# Patient Record
Sex: Female | Born: 1935
Health system: Southern US, Community
[De-identification: ages and names within clinical notes are randomized; demographics above are authoritative.]

## PROBLEM LIST (undated history)

## (undated) DIAGNOSIS — I509 Heart failure, unspecified: Secondary | ICD-10-CM

## (undated) DIAGNOSIS — I4891 Unspecified atrial fibrillation: Secondary | ICD-10-CM

## (undated) DIAGNOSIS — I428 Other cardiomyopathies: Secondary | ICD-10-CM

## (undated) DIAGNOSIS — E785 Hyperlipidemia, unspecified: Secondary | ICD-10-CM

## (undated) DIAGNOSIS — Z7901 Long term (current) use of anticoagulants: Secondary | ICD-10-CM

## (undated) DIAGNOSIS — I341 Nonrheumatic mitral (valve) prolapse: Secondary | ICD-10-CM

## (undated) HISTORY — DX: Nonrheumatic mitral (valve) prolapse: I34.1

## (undated) HISTORY — DX: Other cardiomyopathies: I42.8

## (undated) HISTORY — DX: Long term (current) use of anticoagulants: Z79.01

## (undated) HISTORY — PX: OTHER SURGICAL HISTORY: SHX169

## (undated) HISTORY — DX: Heart failure, unspecified: I50.9

## (undated) HISTORY — DX: Unspecified atrial fibrillation: I48.91

## (undated) HISTORY — DX: Hyperlipidemia, unspecified: E78.5

## (undated) HISTORY — PX: HEMORROIDECTOMY: SUR656

## (undated) HISTORY — PX: TONSILLECTOMY: SUR1361

---

## 1998-06-26 ENCOUNTER — Other Ambulatory Visit: Admission: RE | Admit: 1998-06-26 | Discharge: 1998-06-26 | Payer: Self-pay | Admitting: Obstetrics and Gynecology

## 1999-04-03 ENCOUNTER — Other Ambulatory Visit: Admission: RE | Admit: 1999-04-03 | Discharge: 1999-04-03 | Payer: Self-pay | Admitting: Obstetrics and Gynecology

## 1999-04-03 ENCOUNTER — Encounter (INDEPENDENT_AMBULATORY_CARE_PROVIDER_SITE_OTHER): Payer: Self-pay | Admitting: Specialist

## 1999-07-03 ENCOUNTER — Other Ambulatory Visit: Admission: RE | Admit: 1999-07-03 | Discharge: 1999-07-03 | Payer: Self-pay | Admitting: Obstetrics and Gynecology

## 2000-04-14 ENCOUNTER — Encounter: Payer: Self-pay | Admitting: Internal Medicine

## 2000-07-08 ENCOUNTER — Other Ambulatory Visit: Admission: RE | Admit: 2000-07-08 | Discharge: 2000-07-08 | Payer: Self-pay | Admitting: Obstetrics and Gynecology

## 2001-07-10 ENCOUNTER — Other Ambulatory Visit: Admission: RE | Admit: 2001-07-10 | Discharge: 2001-07-10 | Payer: Self-pay | Admitting: Obstetrics and Gynecology

## 2002-08-24 ENCOUNTER — Ambulatory Visit (HOSPITAL_COMMUNITY): Admission: RE | Admit: 2002-08-24 | Discharge: 2002-08-24 | Payer: Self-pay | Admitting: Gastroenterology

## 2002-11-23 ENCOUNTER — Other Ambulatory Visit: Admission: RE | Admit: 2002-11-23 | Discharge: 2002-11-23 | Payer: Self-pay | Admitting: Obstetrics and Gynecology

## 2004-04-03 ENCOUNTER — Ambulatory Visit (HOSPITAL_COMMUNITY): Admission: RE | Admit: 2004-04-03 | Discharge: 2004-04-03 | Payer: Self-pay | Admitting: Cardiology

## 2004-07-20 ENCOUNTER — Inpatient Hospital Stay (HOSPITAL_COMMUNITY): Admission: RE | Admit: 2004-07-20 | Discharge: 2004-07-28 | Payer: Self-pay | Admitting: Cardiothoracic Surgery

## 2004-07-21 ENCOUNTER — Encounter (INDEPENDENT_AMBULATORY_CARE_PROVIDER_SITE_OTHER): Payer: Self-pay | Admitting: *Deleted

## 2004-07-24 ENCOUNTER — Ambulatory Visit: Payer: Self-pay | Admitting: Internal Medicine

## 2004-08-12 ENCOUNTER — Inpatient Hospital Stay (HOSPITAL_COMMUNITY): Admission: AD | Admit: 2004-08-12 | Discharge: 2004-08-22 | Payer: Self-pay | Admitting: Cardiology

## 2004-10-05 ENCOUNTER — Encounter (HOSPITAL_COMMUNITY): Admission: RE | Admit: 2004-10-05 | Discharge: 2005-01-03 | Payer: Self-pay | Admitting: Cardiology

## 2005-01-04 ENCOUNTER — Encounter (HOSPITAL_COMMUNITY): Admission: RE | Admit: 2005-01-04 | Discharge: 2005-01-09 | Payer: Self-pay | Admitting: Cardiology

## 2005-01-11 ENCOUNTER — Encounter (HOSPITAL_COMMUNITY): Admission: RE | Admit: 2005-01-11 | Discharge: 2005-04-11 | Payer: Self-pay | Admitting: Cardiology

## 2005-04-30 ENCOUNTER — Encounter (HOSPITAL_COMMUNITY): Admission: RE | Admit: 2005-04-30 | Discharge: 2005-07-28 | Payer: Self-pay | Admitting: Cardiology

## 2005-07-30 ENCOUNTER — Encounter (HOSPITAL_COMMUNITY): Admission: RE | Admit: 2005-07-30 | Discharge: 2005-10-28 | Payer: Self-pay | Admitting: Cardiology

## 2005-10-29 ENCOUNTER — Encounter (HOSPITAL_COMMUNITY): Admission: RE | Admit: 2005-10-29 | Discharge: 2006-01-27 | Payer: Self-pay | Admitting: Cardiology

## 2006-01-29 ENCOUNTER — Encounter (HOSPITAL_COMMUNITY): Admission: RE | Admit: 2006-01-29 | Discharge: 2006-04-29 | Payer: Self-pay | Admitting: Cardiology

## 2006-04-11 ENCOUNTER — Ambulatory Visit: Payer: Self-pay | Admitting: Family Medicine

## 2006-04-30 ENCOUNTER — Encounter (HOSPITAL_COMMUNITY): Admission: RE | Admit: 2006-04-30 | Discharge: 2006-07-29 | Payer: Self-pay | Admitting: Cardiology

## 2006-05-04 ENCOUNTER — Ambulatory Visit: Payer: Self-pay | Admitting: Family Medicine

## 2006-06-15 ENCOUNTER — Ambulatory Visit: Payer: Self-pay | Admitting: Family Medicine

## 2006-07-06 ENCOUNTER — Ambulatory Visit: Payer: Self-pay | Admitting: Cardiovascular Disease

## 2006-07-13 ENCOUNTER — Ambulatory Visit: Payer: Self-pay

## 2006-07-13 ENCOUNTER — Ambulatory Visit: Payer: Self-pay | Admitting: Cardiovascular Disease

## 2006-07-13 ENCOUNTER — Encounter: Payer: Self-pay | Admitting: Cardiology

## 2006-07-13 LAB — CONVERTED CEMR LAB
ALT: 52 units/L — ABNORMAL HIGH (ref 0–40)
Basophils Relative: 0.7 % (ref 0.0–1.0)
Bilirubin, Direct: 0.3 mg/dL (ref 0.0–0.3)
CO2: 29 meq/L (ref 19–32)
Calcium: 9.1 mg/dL (ref 8.4–10.5)
Eosinophils Relative: 1.4 % (ref 0.0–5.0)
GFR calc Af Amer: 78 mL/min
Glucose, Bld: 100 mg/dL — ABNORMAL HIGH (ref 70–99)
Hemoglobin: 14.9 g/dL (ref 12.0–15.0)
Lymphocytes Relative: 18.7 % (ref 12.0–46.0)
Monocytes Absolute: 0.4 10*3/uL (ref 0.2–0.7)
Neutro Abs: 4.5 10*3/uL (ref 1.4–7.7)
Potassium: 4.6 meq/L (ref 3.5–5.1)
Total Protein: 6.2 g/dL (ref 6.0–8.3)
VLDL: 19 mg/dL (ref 0–40)
WBC: 6.1 10*3/uL (ref 4.5–10.5)

## 2006-07-15 ENCOUNTER — Ambulatory Visit: Payer: Self-pay | Admitting: Family Medicine

## 2006-07-20 ENCOUNTER — Ambulatory Visit: Payer: Self-pay | Admitting: Family Medicine

## 2006-07-28 ENCOUNTER — Ambulatory Visit: Payer: Self-pay | Admitting: Cardiovascular Disease

## 2006-07-28 LAB — CONVERTED CEMR LAB
Calcium: 9.2 mg/dL (ref 8.4–10.5)
GFR calc Af Amer: 78 mL/min
GFR calc non Af Amer: 65 mL/min
Glucose, Bld: 89 mg/dL (ref 70–99)

## 2006-07-30 ENCOUNTER — Encounter (HOSPITAL_COMMUNITY): Admission: RE | Admit: 2006-07-30 | Discharge: 2006-10-28 | Payer: Self-pay | Admitting: Cardiovascular Disease

## 2006-08-05 ENCOUNTER — Ambulatory Visit: Payer: Self-pay | Admitting: Family Medicine

## 2006-08-24 ENCOUNTER — Ambulatory Visit: Payer: Self-pay | Admitting: Cardiovascular Disease

## 2006-09-26 ENCOUNTER — Encounter: Admission: RE | Admit: 2006-09-26 | Discharge: 2006-09-26 | Payer: Self-pay | Admitting: Family Medicine

## 2006-09-26 ENCOUNTER — Ambulatory Visit: Payer: Self-pay | Admitting: Family Medicine

## 2006-10-25 ENCOUNTER — Ambulatory Visit: Payer: Self-pay | Admitting: Cardiovascular Disease

## 2006-10-31 ENCOUNTER — Encounter (HOSPITAL_COMMUNITY): Admission: RE | Admit: 2006-10-31 | Discharge: 2007-01-29 | Payer: Self-pay | Admitting: Cardiovascular Disease

## 2006-12-14 ENCOUNTER — Ambulatory Visit: Payer: Self-pay | Admitting: Internal Medicine

## 2006-12-21 DIAGNOSIS — I482 Chronic atrial fibrillation, unspecified: Secondary | ICD-10-CM | POA: Insufficient documentation

## 2006-12-30 ENCOUNTER — Ambulatory Visit: Payer: Self-pay | Admitting: Internal Medicine

## 2006-12-30 DIAGNOSIS — I428 Other cardiomyopathies: Secondary | ICD-10-CM

## 2007-01-02 ENCOUNTER — Ambulatory Visit: Payer: Self-pay | Admitting: Internal Medicine

## 2007-01-02 DIAGNOSIS — R05 Cough: Secondary | ICD-10-CM | POA: Insufficient documentation

## 2007-01-02 DIAGNOSIS — R413 Other amnesia: Secondary | ICD-10-CM

## 2007-01-02 DIAGNOSIS — T887XXA Unspecified adverse effect of drug or medicament, initial encounter: Secondary | ICD-10-CM

## 2007-01-02 DIAGNOSIS — R634 Abnormal weight loss: Secondary | ICD-10-CM

## 2007-01-02 LAB — CONVERTED CEMR LAB
ALT: 79 units/L — ABNORMAL HIGH (ref 0–35)
Albumin: 3.5 g/dL (ref 3.5–5.2)
Alkaline Phosphatase: 62 units/L (ref 39–117)
BUN: 17 mg/dL (ref 6–23)
Basophils Absolute: 0.1 10*3/uL (ref 0.0–0.1)
Basophils Relative: 0.8 % (ref 0.0–1.0)
Calcium: 9.1 mg/dL (ref 8.4–10.5)
Eosinophils Absolute: 0.1 10*3/uL (ref 0.0–0.6)
GFR calc Af Amer: 89 mL/min
GFR calc non Af Amer: 74 mL/min
Lymphocytes Relative: 24.8 % (ref 12.0–46.0)
MCHC: 33.2 g/dL (ref 30.0–36.0)
MCV: 96.2 fL (ref 78.0–100.0)
Monocytes Relative: 6.1 % (ref 3.0–11.0)
Neutro Abs: 4.4 10*3/uL (ref 1.4–7.7)
Platelets: 181 10*3/uL (ref 150–400)
Prothrombin Time: 25.7 s

## 2007-01-04 ENCOUNTER — Ambulatory Visit: Payer: Self-pay | Admitting: Cardiology

## 2007-01-04 ENCOUNTER — Ambulatory Visit: Payer: Self-pay

## 2007-01-04 ENCOUNTER — Encounter: Payer: Self-pay | Admitting: Cardiovascular Disease

## 2007-01-05 ENCOUNTER — Telehealth: Payer: Self-pay | Admitting: Internal Medicine

## 2007-01-09 ENCOUNTER — Ambulatory Visit: Payer: Self-pay | Admitting: Internal Medicine

## 2007-01-11 ENCOUNTER — Other Ambulatory Visit: Admission: RE | Admit: 2007-01-11 | Discharge: 2007-01-11 | Payer: Self-pay | Admitting: Obstetrics and Gynecology

## 2007-01-12 ENCOUNTER — Ambulatory Visit: Payer: Self-pay | Admitting: Cardiovascular Disease

## 2007-01-13 ENCOUNTER — Ambulatory Visit: Payer: Self-pay | Admitting: Internal Medicine

## 2007-01-13 LAB — CONVERTED CEMR LAB
INR: 1.4 (ref 0.9–2.0)
Prothrombin Time: 14.4 s — ABNORMAL HIGH (ref 10.0–14.0)
aPTT: 25.9 s — ABNORMAL LOW (ref 26.5–36.5)

## 2007-01-16 ENCOUNTER — Telehealth: Payer: Self-pay | Admitting: Internal Medicine

## 2007-01-17 ENCOUNTER — Ambulatory Visit: Payer: Self-pay | Admitting: Internal Medicine

## 2007-01-17 LAB — CONVERTED CEMR LAB
BUN: 19 mg/dL (ref 6–23)
Basophils Relative: 0.3 % (ref 0.0–1.0)
Calcium: 8.9 mg/dL (ref 8.4–10.5)
Chloride: 108 meq/L (ref 96–112)
Eosinophils Absolute: 0.1 10*3/uL (ref 0.0–0.6)
Eosinophils Relative: 1.9 % (ref 0.0–5.0)
GFR calc Af Amer: 78 mL/min
GFR calc non Af Amer: 64 mL/min
Glucose, Bld: 115 mg/dL — ABNORMAL HIGH (ref 70–99)
MCV: 95 fL (ref 78.0–100.0)
Monocytes Relative: 7.6 % (ref 3.0–11.0)
Platelets: 121 10*3/uL — ABNORMAL LOW (ref 150–400)
Prothrombin Time: 13.8 s (ref 10.0–14.0)
RBC: 4.12 M/uL (ref 3.87–5.11)
WBC: 6.8 10*3/uL (ref 4.5–10.5)

## 2007-01-23 ENCOUNTER — Ambulatory Visit: Payer: Self-pay | Admitting: Internal Medicine

## 2007-01-23 ENCOUNTER — Inpatient Hospital Stay (HOSPITAL_COMMUNITY): Admission: RE | Admit: 2007-01-23 | Discharge: 2007-01-24 | Payer: Self-pay | Admitting: Internal Medicine

## 2007-01-24 ENCOUNTER — Encounter: Payer: Self-pay | Admitting: Family Medicine

## 2007-01-26 ENCOUNTER — Ambulatory Visit: Payer: Self-pay | Admitting: Internal Medicine

## 2007-01-26 LAB — CONVERTED CEMR LAB
INR: 1.8
Prothrombin Time: 16.5 s

## 2007-01-27 ENCOUNTER — Telehealth: Payer: Self-pay | Admitting: *Deleted

## 2007-01-30 ENCOUNTER — Encounter (HOSPITAL_COMMUNITY): Admission: RE | Admit: 2007-01-30 | Discharge: 2007-04-30 | Payer: Self-pay | Admitting: Cardiovascular Disease

## 2007-02-01 ENCOUNTER — Telehealth: Payer: Self-pay | Admitting: Internal Medicine

## 2007-02-08 ENCOUNTER — Ambulatory Visit: Payer: Self-pay

## 2007-02-09 ENCOUNTER — Ambulatory Visit: Payer: Self-pay | Admitting: Internal Medicine

## 2007-02-09 LAB — CONVERTED CEMR LAB
BUN: 15 mg/dL (ref 6–23)
CO2: 29 meq/L (ref 19–32)
Chloride: 106 meq/L (ref 96–112)
Creatinine, Ser: 0.9 mg/dL (ref 0.4–1.2)
Pro B Natriuretic peptide (BNP): 4218 pg/mL — ABNORMAL HIGH (ref 0.0–100.0)

## 2007-02-13 ENCOUNTER — Telehealth: Payer: Self-pay | Admitting: *Deleted

## 2007-02-15 ENCOUNTER — Telehealth (INDEPENDENT_AMBULATORY_CARE_PROVIDER_SITE_OTHER): Payer: Self-pay | Admitting: *Deleted

## 2007-02-15 ENCOUNTER — Ambulatory Visit: Payer: Self-pay | Admitting: Internal Medicine

## 2007-02-15 LAB — CONVERTED CEMR LAB
ALT: 107 units/L — ABNORMAL HIGH (ref 0–35)
AST: 83 units/L — ABNORMAL HIGH (ref 0–37)
Albumin: 3.1 g/dL — ABNORMAL LOW (ref 3.5–5.2)
Alkaline Phosphatase: 95 units/L (ref 39–117)
Total Bilirubin: 1.5 mg/dL — ABNORMAL HIGH (ref 0.3–1.2)

## 2007-02-22 ENCOUNTER — Ambulatory Visit: Payer: Self-pay | Admitting: Internal Medicine

## 2007-02-24 ENCOUNTER — Ambulatory Visit: Payer: Self-pay | Admitting: Cardiovascular Disease

## 2007-02-24 LAB — CONVERTED CEMR LAB
BUN: 28 mg/dL — ABNORMAL HIGH (ref 6–23)
CO2: 32 meq/L (ref 19–32)
Calcium: 9 mg/dL (ref 8.4–10.5)
Chloride: 97 meq/L (ref 96–112)
Creatinine, Ser: 1.4 mg/dL — ABNORMAL HIGH (ref 0.4–1.2)
GFR calc Af Amer: 47 mL/min

## 2007-03-01 ENCOUNTER — Ambulatory Visit: Payer: Self-pay | Admitting: Cardiology

## 2007-03-01 ENCOUNTER — Encounter: Payer: Self-pay | Admitting: Internal Medicine

## 2007-03-01 LAB — CONVERTED CEMR LAB
CO2: 33 meq/L — ABNORMAL HIGH (ref 19–32)
Calcium: 8.7 mg/dL (ref 8.4–10.5)
Chloride: 99 meq/L (ref 96–112)
Creatinine, Ser: 1.2 mg/dL (ref 0.4–1.2)
GFR calc non Af Amer: 46 mL/min
Glucose, Bld: 113 mg/dL — ABNORMAL HIGH (ref 70–99)

## 2007-03-02 ENCOUNTER — Inpatient Hospital Stay (HOSPITAL_COMMUNITY): Admission: EM | Admit: 2007-03-02 | Discharge: 2007-03-15 | Payer: Self-pay | Admitting: Emergency Medicine

## 2007-03-02 ENCOUNTER — Ambulatory Visit: Payer: Self-pay | Admitting: Cardiovascular Disease

## 2007-03-15 ENCOUNTER — Telehealth: Payer: Self-pay | Admitting: Internal Medicine

## 2007-03-15 ENCOUNTER — Encounter: Payer: Self-pay | Admitting: Family Medicine

## 2007-03-17 ENCOUNTER — Ambulatory Visit: Payer: Self-pay | Admitting: Internal Medicine

## 2007-03-17 LAB — CONVERTED CEMR LAB: Prothrombin Time: 14.3 s

## 2007-03-20 ENCOUNTER — Telehealth: Payer: Self-pay | Admitting: *Deleted

## 2007-03-24 ENCOUNTER — Ambulatory Visit: Payer: Self-pay | Admitting: Internal Medicine

## 2007-03-24 LAB — CONVERTED CEMR LAB
CO2: 28 meq/L (ref 19–32)
Calcium: 9.2 mg/dL (ref 8.4–10.5)
Potassium: 5.9 meq/L — ABNORMAL HIGH (ref 3.5–5.3)
Pro B Natriuretic peptide (BNP): 1335 pg/mL — ABNORMAL HIGH (ref 0.0–100.0)
Sodium: 142 meq/L (ref 135–145)

## 2007-03-27 ENCOUNTER — Telehealth: Payer: Self-pay | Admitting: *Deleted

## 2007-04-07 ENCOUNTER — Ambulatory Visit: Payer: Self-pay | Admitting: Internal Medicine

## 2007-04-07 LAB — CONVERTED CEMR LAB
INR: 2.9
Prothrombin Time: 20.6 s

## 2007-04-10 ENCOUNTER — Ambulatory Visit: Payer: Self-pay | Admitting: Cardiovascular Disease

## 2007-04-19 ENCOUNTER — Ambulatory Visit: Payer: Self-pay | Admitting: Internal Medicine

## 2007-04-19 LAB — CONVERTED CEMR LAB
BUN: 25 mg/dL — ABNORMAL HIGH (ref 6–23)
Creatinine, Ser: 1 mg/dL (ref 0.4–1.2)
GFR calc Af Amer: 69 mL/min
Potassium: 5 meq/L (ref 3.5–5.1)
Pro B Natriuretic peptide (BNP): 466 pg/mL — ABNORMAL HIGH (ref 0.0–100.0)

## 2007-04-24 ENCOUNTER — Telehealth: Payer: Self-pay | Admitting: Internal Medicine

## 2007-04-28 ENCOUNTER — Ambulatory Visit: Payer: Self-pay | Admitting: Internal Medicine

## 2007-04-29 ENCOUNTER — Ambulatory Visit: Payer: Self-pay | Admitting: Internal Medicine

## 2007-04-29 ENCOUNTER — Ambulatory Visit: Payer: Self-pay | Admitting: Cardiology

## 2007-04-29 ENCOUNTER — Inpatient Hospital Stay (HOSPITAL_COMMUNITY): Admission: EM | Admit: 2007-04-29 | Discharge: 2007-05-03 | Payer: Self-pay | Admitting: Emergency Medicine

## 2007-04-30 LAB — CONVERTED CEMR LAB
ALT: 48 units/L — ABNORMAL HIGH (ref 0–35)
AST: 51 units/L — ABNORMAL HIGH (ref 0–37)
Albumin: 3.7 g/dL (ref 3.5–5.2)
Alkaline Phosphatase: 55 units/L (ref 39–117)
BUN: 35 mg/dL — ABNORMAL HIGH (ref 6–23)
Basophils Absolute: 0 10*3/uL (ref 0.0–0.1)
Basophils Relative: 0 % (ref 0.0–1.0)
Bilirubin, Direct: 0.3 mg/dL (ref 0.0–0.3)
CO2: 30 meq/L (ref 19–32)
Calcium: 9.8 mg/dL (ref 8.4–10.5)
Chloride: 88 meq/L — ABNORMAL LOW (ref 96–112)
Creatinine, Ser: 2.1 mg/dL — ABNORMAL HIGH (ref 0.4–1.2)
Eosinophils Absolute: 0 10*3/uL (ref 0.0–0.6)
Eosinophils Relative: 0.4 % (ref 0.0–5.0)
GFR calc Af Amer: 29 mL/min
GFR calc non Af Amer: 24 mL/min
Glucose, Bld: 103 mg/dL — ABNORMAL HIGH (ref 70–99)
HCT: 45.3 % (ref 36.0–46.0)
Hemoglobin: 15.5 g/dL — ABNORMAL HIGH (ref 12.0–15.0)
Lymphocytes Relative: 11.7 % — ABNORMAL LOW (ref 12.0–46.0)
MCHC: 34.1 g/dL (ref 30.0–36.0)
MCV: 93.6 fL (ref 78.0–100.0)
Monocytes Absolute: 0.8 10*3/uL — ABNORMAL HIGH (ref 0.2–0.7)
Monocytes Relative: 8.5 % (ref 3.0–11.0)
Neutro Abs: 7.6 10*3/uL (ref 1.4–7.7)
Neutrophils Relative %: 79.4 % — ABNORMAL HIGH (ref 43.0–77.0)
Phosphorus: 4.2 mg/dL (ref 2.3–4.6)
Platelets: 172 10*3/uL (ref 150–400)
Potassium: 6.2 meq/L (ref 3.5–5.1)
Pro B Natriuretic peptide (BNP): 370 pg/mL — ABNORMAL HIGH (ref 0.0–100.0)
RBC: 4.84 M/uL (ref 3.87–5.11)
RDW: 13.6 % (ref 11.5–14.6)
Sodium: 128 meq/L — ABNORMAL LOW (ref 135–145)
Total Bilirubin: 1.1 mg/dL (ref 0.3–1.2)
Total Protein: 6.5 g/dL (ref 6.0–8.3)
WBC: 9.5 10*3/uL (ref 4.5–10.5)

## 2007-05-10 ENCOUNTER — Ambulatory Visit: Payer: Self-pay | Admitting: Cardiovascular Disease

## 2007-05-10 LAB — CONVERTED CEMR LAB
CO2: 33 meq/L — ABNORMAL HIGH (ref 19–32)
Calcium: 9.2 mg/dL (ref 8.4–10.5)
GFR calc Af Amer: 62 mL/min
Glucose, Bld: 74 mg/dL (ref 70–99)
Potassium: 3.9 meq/L (ref 3.5–5.1)

## 2007-05-17 ENCOUNTER — Ambulatory Visit: Payer: Self-pay

## 2007-05-18 ENCOUNTER — Ambulatory Visit: Payer: Self-pay | Admitting: Internal Medicine

## 2007-05-18 LAB — CONVERTED CEMR LAB
CO2: 34 meq/L — ABNORMAL HIGH (ref 19–32)
Calcium: 9.2 mg/dL (ref 8.4–10.5)
Creatinine, Ser: 0.8 mg/dL (ref 0.4–1.2)
GFR calc Af Amer: 89 mL/min
Glucose, Bld: 69 mg/dL — ABNORMAL LOW (ref 70–99)

## 2007-06-05 ENCOUNTER — Ambulatory Visit: Payer: Self-pay | Admitting: Cardiovascular Disease

## 2007-06-23 ENCOUNTER — Ambulatory Visit: Payer: Self-pay | Admitting: Internal Medicine

## 2007-06-23 LAB — CONVERTED CEMR LAB
CO2: 32 meq/L (ref 19–32)
Chloride: 98 meq/L (ref 96–112)
Creatinine, Ser: 1 mg/dL (ref 0.4–1.2)
GFR calc non Af Amer: 57 mL/min
Glucose, Bld: 78 mg/dL (ref 70–99)
Sodium: 137 meq/L (ref 135–145)

## 2007-07-07 ENCOUNTER — Ambulatory Visit: Payer: Self-pay | Admitting: Cardiovascular Disease

## 2007-08-22 ENCOUNTER — Ambulatory Visit: Payer: Self-pay | Admitting: Internal Medicine

## 2007-08-25 ENCOUNTER — Ambulatory Visit: Payer: Self-pay | Admitting: Internal Medicine

## 2007-09-12 ENCOUNTER — Ambulatory Visit: Payer: Self-pay | Admitting: Cardiovascular Disease

## 2007-09-21 ENCOUNTER — Ambulatory Visit: Payer: Self-pay | Admitting: Internal Medicine

## 2007-09-21 LAB — CONVERTED CEMR LAB
ALT: 53 units/L — ABNORMAL HIGH (ref 0–35)
AST: 43 units/L — ABNORMAL HIGH (ref 0–37)
Alkaline Phosphatase: 58 units/L (ref 39–117)
Bilirubin, Direct: 0.1 mg/dL (ref 0.0–0.3)
CO2: 34 meq/L — ABNORMAL HIGH (ref 19–32)
Chloride: 96 meq/L (ref 96–112)
Glucose, Bld: 71 mg/dL (ref 70–99)
Potassium: 4.6 meq/L (ref 3.5–5.1)
Sodium: 141 meq/L (ref 135–145)
Total Protein: 7.2 g/dL (ref 6.0–8.3)

## 2007-10-19 ENCOUNTER — Ambulatory Visit: Payer: Self-pay | Admitting: Internal Medicine

## 2007-10-19 LAB — CONVERTED CEMR LAB: Prothrombin Time: 21 s

## 2007-10-30 ENCOUNTER — Ambulatory Visit: Payer: Self-pay | Admitting: Internal Medicine

## 2007-11-23 ENCOUNTER — Ambulatory Visit: Payer: Self-pay | Admitting: Internal Medicine

## 2007-11-23 LAB — CONVERTED CEMR LAB
BUN: 18 mg/dL (ref 6–23)
Basophils Relative: 0.1 % (ref 0.0–1.0)
Chloride: 96 meq/L (ref 96–112)
Creatinine, Ser: 1 mg/dL (ref 0.4–1.2)
Eosinophils Absolute: 0.1 10*3/uL (ref 0.0–0.7)
Eosinophils Relative: 1.3 % (ref 0.0–5.0)
GFR calc Af Amer: 69 mL/min
GFR calc non Af Amer: 57 mL/min
Glucose, Bld: 117 mg/dL — ABNORMAL HIGH (ref 70–99)
HCT: 37.3 % (ref 36.0–46.0)
MCV: 96.2 fL (ref 78.0–100.0)
Monocytes Absolute: 0.5 10*3/uL (ref 0.1–1.0)
Monocytes Relative: 7.4 % (ref 3.0–12.0)
RBC: 3.88 M/uL (ref 3.87–5.11)
WBC: 7 10*3/uL (ref 4.5–10.5)

## 2007-12-15 ENCOUNTER — Ambulatory Visit: Payer: Self-pay | Admitting: Cardiovascular Disease

## 2007-12-22 ENCOUNTER — Ambulatory Visit: Payer: Self-pay | Admitting: Internal Medicine

## 2007-12-22 LAB — CONVERTED CEMR LAB
ALT: 71 units/L — ABNORMAL HIGH (ref 0–35)
AST: 51 units/L — ABNORMAL HIGH (ref 0–37)
Bilirubin, Direct: 0.2 mg/dL (ref 0.0–0.3)
TSH: 3.62 microintl units/mL (ref 0.35–5.50)
Total Bilirubin: 0.9 mg/dL (ref 0.3–1.2)
Total Protein: 6.7 g/dL (ref 6.0–8.3)

## 2008-01-19 ENCOUNTER — Ambulatory Visit: Payer: Self-pay | Admitting: Internal Medicine

## 2008-01-31 ENCOUNTER — Ambulatory Visit: Payer: Self-pay | Admitting: Internal Medicine

## 2008-02-16 ENCOUNTER — Ambulatory Visit: Payer: Self-pay | Admitting: Internal Medicine

## 2008-02-16 LAB — CONVERTED CEMR LAB
INR: 1.6
Prothrombin Time: 15.7 s

## 2008-02-22 ENCOUNTER — Encounter: Admission: RE | Admit: 2008-02-22 | Discharge: 2008-02-22 | Payer: Self-pay | Admitting: Internal Medicine

## 2008-02-27 ENCOUNTER — Ambulatory Visit: Payer: Self-pay | Admitting: Internal Medicine

## 2008-02-27 DIAGNOSIS — E785 Hyperlipidemia, unspecified: Secondary | ICD-10-CM | POA: Insufficient documentation

## 2008-02-27 LAB — CONVERTED CEMR LAB
ALT: 64 units/L — ABNORMAL HIGH (ref 0–35)
AST: 52 units/L — ABNORMAL HIGH (ref 0–37)
Alkaline Phosphatase: 59 units/L (ref 39–117)
Bilirubin, Direct: 0.2 mg/dL (ref 0.0–0.3)
CO2: 30 meq/L (ref 19–32)
Chloride: 96 meq/L (ref 96–112)
Creatinine, Ser: 1 mg/dL (ref 0.4–1.2)
GFR calc non Af Amer: 57 mL/min
Potassium: 4.7 meq/L (ref 3.5–5.1)
Pro B Natriuretic peptide (BNP): 189 pg/mL — ABNORMAL HIGH (ref 0.0–100.0)
Total Bilirubin: 0.7 mg/dL (ref 0.3–1.2)

## 2008-03-04 ENCOUNTER — Encounter: Payer: Self-pay | Admitting: Internal Medicine

## 2008-03-13 ENCOUNTER — Telehealth: Payer: Self-pay | Admitting: Internal Medicine

## 2008-03-15 ENCOUNTER — Ambulatory Visit: Payer: Self-pay | Admitting: Internal Medicine

## 2008-03-16 ENCOUNTER — Ambulatory Visit: Payer: Self-pay | Admitting: Internal Medicine

## 2008-03-16 DIAGNOSIS — H00019 Hordeolum externum unspecified eye, unspecified eyelid: Secondary | ICD-10-CM

## 2008-03-27 ENCOUNTER — Ambulatory Visit: Payer: Self-pay | Admitting: Cardiovascular Disease

## 2008-04-10 ENCOUNTER — Emergency Department (HOSPITAL_BASED_OUTPATIENT_CLINIC_OR_DEPARTMENT_OTHER): Admission: EM | Admit: 2008-04-10 | Discharge: 2008-04-10 | Payer: Self-pay | Admitting: Emergency Medicine

## 2008-04-10 ENCOUNTER — Telehealth: Payer: Self-pay | Admitting: Internal Medicine

## 2008-04-12 ENCOUNTER — Ambulatory Visit: Payer: Self-pay | Admitting: Internal Medicine

## 2008-04-12 LAB — CONVERTED CEMR LAB
INR: 2.1
Prothrombin Time: 17.8 s

## 2008-04-15 ENCOUNTER — Emergency Department (HOSPITAL_COMMUNITY): Admission: EM | Admit: 2008-04-15 | Discharge: 2008-04-15 | Payer: Self-pay | Admitting: Emergency Medicine

## 2008-04-16 ENCOUNTER — Telehealth: Payer: Self-pay | Admitting: Internal Medicine

## 2008-04-16 DIAGNOSIS — I059 Rheumatic mitral valve disease, unspecified: Secondary | ICD-10-CM | POA: Insufficient documentation

## 2008-04-16 DIAGNOSIS — I5023 Acute on chronic systolic (congestive) heart failure: Secondary | ICD-10-CM

## 2008-04-17 ENCOUNTER — Ambulatory Visit: Payer: Self-pay | Admitting: Cardiology

## 2008-04-17 ENCOUNTER — Ambulatory Visit: Payer: Self-pay | Admitting: Internal Medicine

## 2008-04-17 DIAGNOSIS — R55 Syncope and collapse: Secondary | ICD-10-CM | POA: Insufficient documentation

## 2008-04-17 LAB — CONVERTED CEMR LAB
CO2: 30 meq/L (ref 19–32)
Chloride: 98 meq/L (ref 96–112)
Creatinine, Ser: 1 mg/dL (ref 0.4–1.2)
Free T4: 1 ng/dL (ref 0.6–1.6)
GFR calc non Af Amer: 57 mL/min
Potassium: 4.3 meq/L (ref 3.5–5.1)
Sodium: 136 meq/L (ref 135–145)
TSH: 4.85 microintl units/mL (ref 0.35–5.50)

## 2008-05-10 ENCOUNTER — Ambulatory Visit: Payer: Self-pay | Admitting: Internal Medicine

## 2008-05-10 LAB — CONVERTED CEMR LAB
Digitoxin Lvl: 0.4 ng/mL — ABNORMAL LOW (ref 0.8–2.0)
INR: 1.6

## 2008-05-22 ENCOUNTER — Ambulatory Visit: Payer: Self-pay | Admitting: Internal Medicine

## 2008-05-30 ENCOUNTER — Ambulatory Visit: Payer: Self-pay | Admitting: Internal Medicine

## 2008-06-21 ENCOUNTER — Ambulatory Visit: Payer: Self-pay | Admitting: Internal Medicine

## 2008-06-21 LAB — CONVERTED CEMR LAB
INR: 2.2
Prothrombin Time: 18.1 s

## 2008-07-02 ENCOUNTER — Ambulatory Visit: Payer: Self-pay | Admitting: Cardiovascular Disease

## 2008-07-19 ENCOUNTER — Ambulatory Visit: Payer: Self-pay | Admitting: Internal Medicine

## 2008-07-19 LAB — CONVERTED CEMR LAB
INR: 2.6
Prothrombin Time: 19.5 s

## 2008-08-16 ENCOUNTER — Ambulatory Visit: Payer: Self-pay | Admitting: Internal Medicine

## 2008-08-16 LAB — CONVERTED CEMR LAB
INR: 2.6
Prothrombin Time: 19.7 s

## 2008-08-21 ENCOUNTER — Ambulatory Visit: Payer: Self-pay | Admitting: Internal Medicine

## 2008-08-21 LAB — CONVERTED CEMR LAB
BUN: 27 mg/dL — ABNORMAL HIGH (ref 6–23)
CO2: 32 meq/L (ref 19–32)
Calcium: 8.9 mg/dL (ref 8.4–10.5)
Chloride: 99 meq/L (ref 96–112)
Creatinine, Ser: 0.8 mg/dL (ref 0.4–1.2)
Eosinophils Absolute: 0.2 10*3/uL (ref 0.0–0.7)
HDL: 74.1 mg/dL (ref >39.00)
Lymphocytes Relative: 16.9 % (ref 12.0–46.0)
MCHC: 34.4 g/dL (ref 30.0–36.0)
MCV: 92.8 fL (ref 78.0–100.0)
Monocytes Absolute: 0.4 10*3/uL (ref 0.1–1.0)
Neutrophils Relative %: 72.2 % (ref 43.0–77.0)
Platelets: 168 10*3/uL (ref 150.0–400.0)
Pro B Natriuretic peptide (BNP): 149 pg/mL — ABNORMAL HIGH (ref 0.0–100.0)
Total Bilirubin: 0.7 mg/dL (ref 0.3–1.2)
WBC: 5.7 10*3/uL (ref 4.5–10.5)

## 2008-08-23 ENCOUNTER — Encounter: Payer: Self-pay | Admitting: Internal Medicine

## 2008-08-30 IMAGING — CR DG CHEST 2V
2 series · 2 of 2 positions shown · non-contrast
Comparison: 08/13/04.

CLINICAL DATA: Cough and weight loss.  
 CHEST - 2 VIEW:

[view not recorded (1 of 2)]
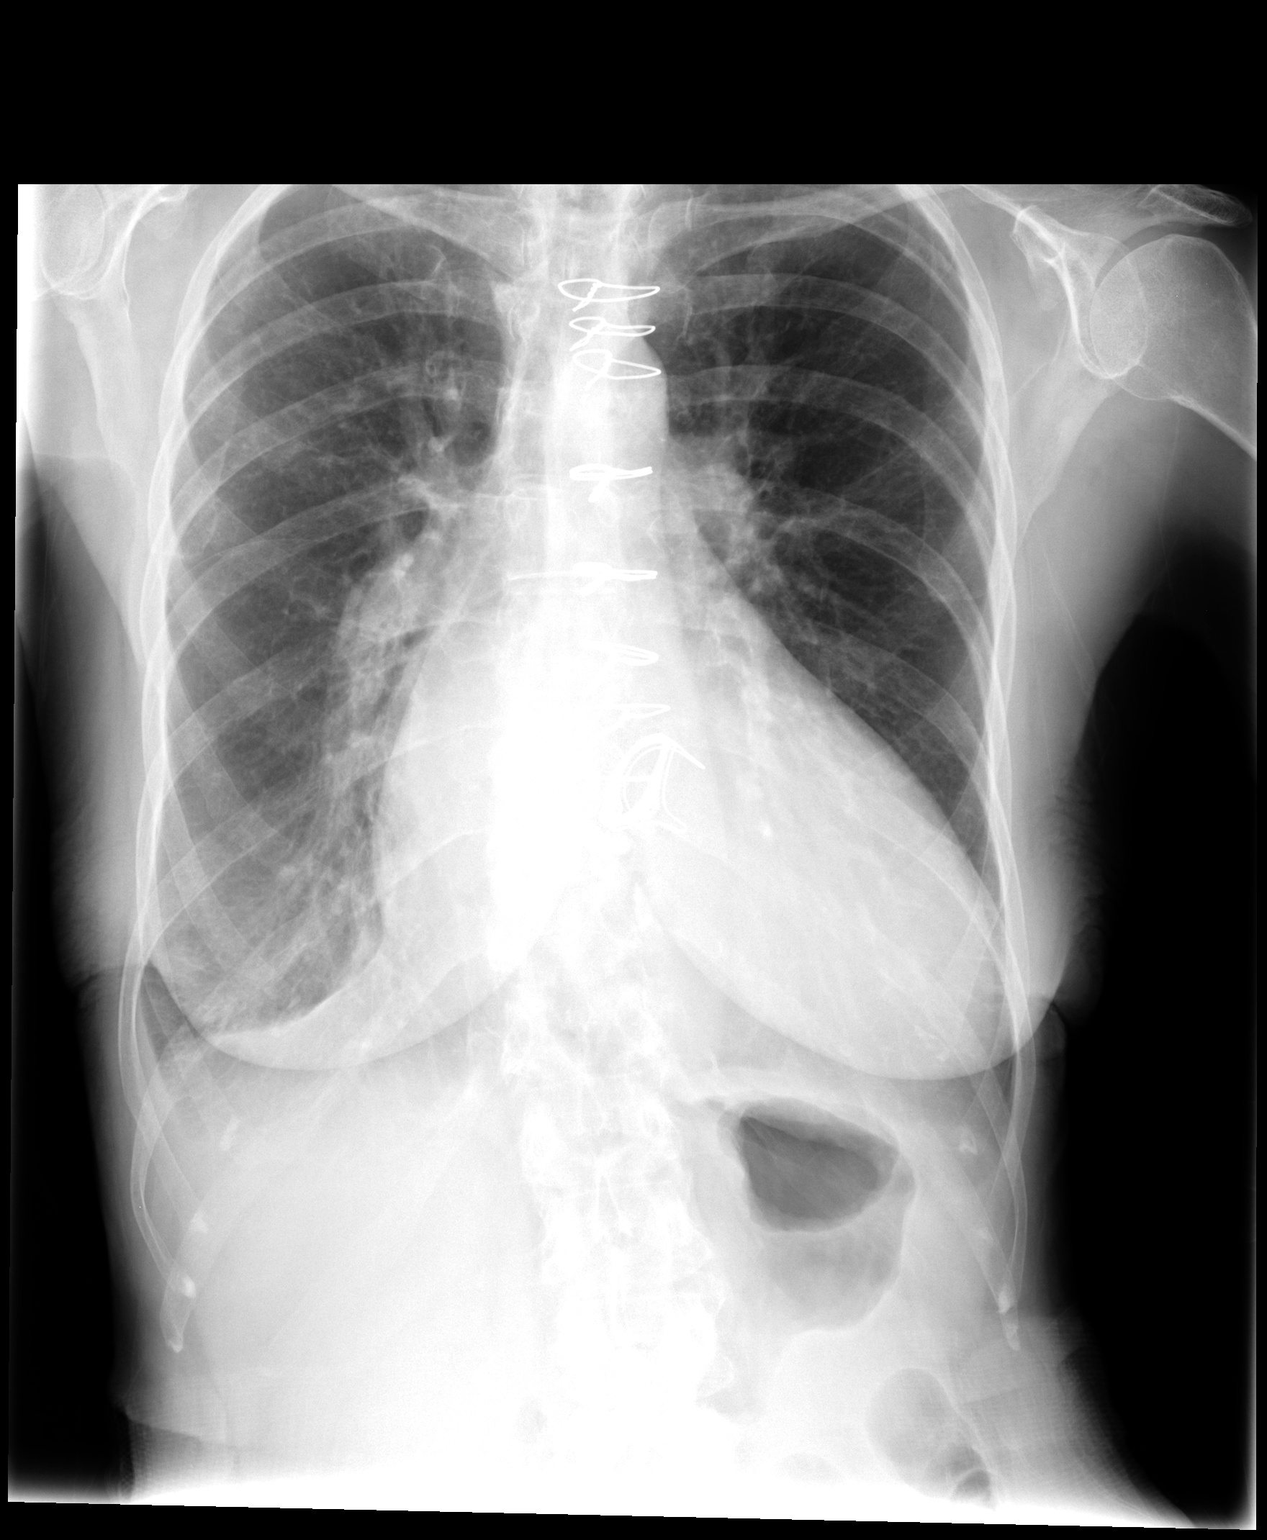

[view not recorded (2 of 2)]
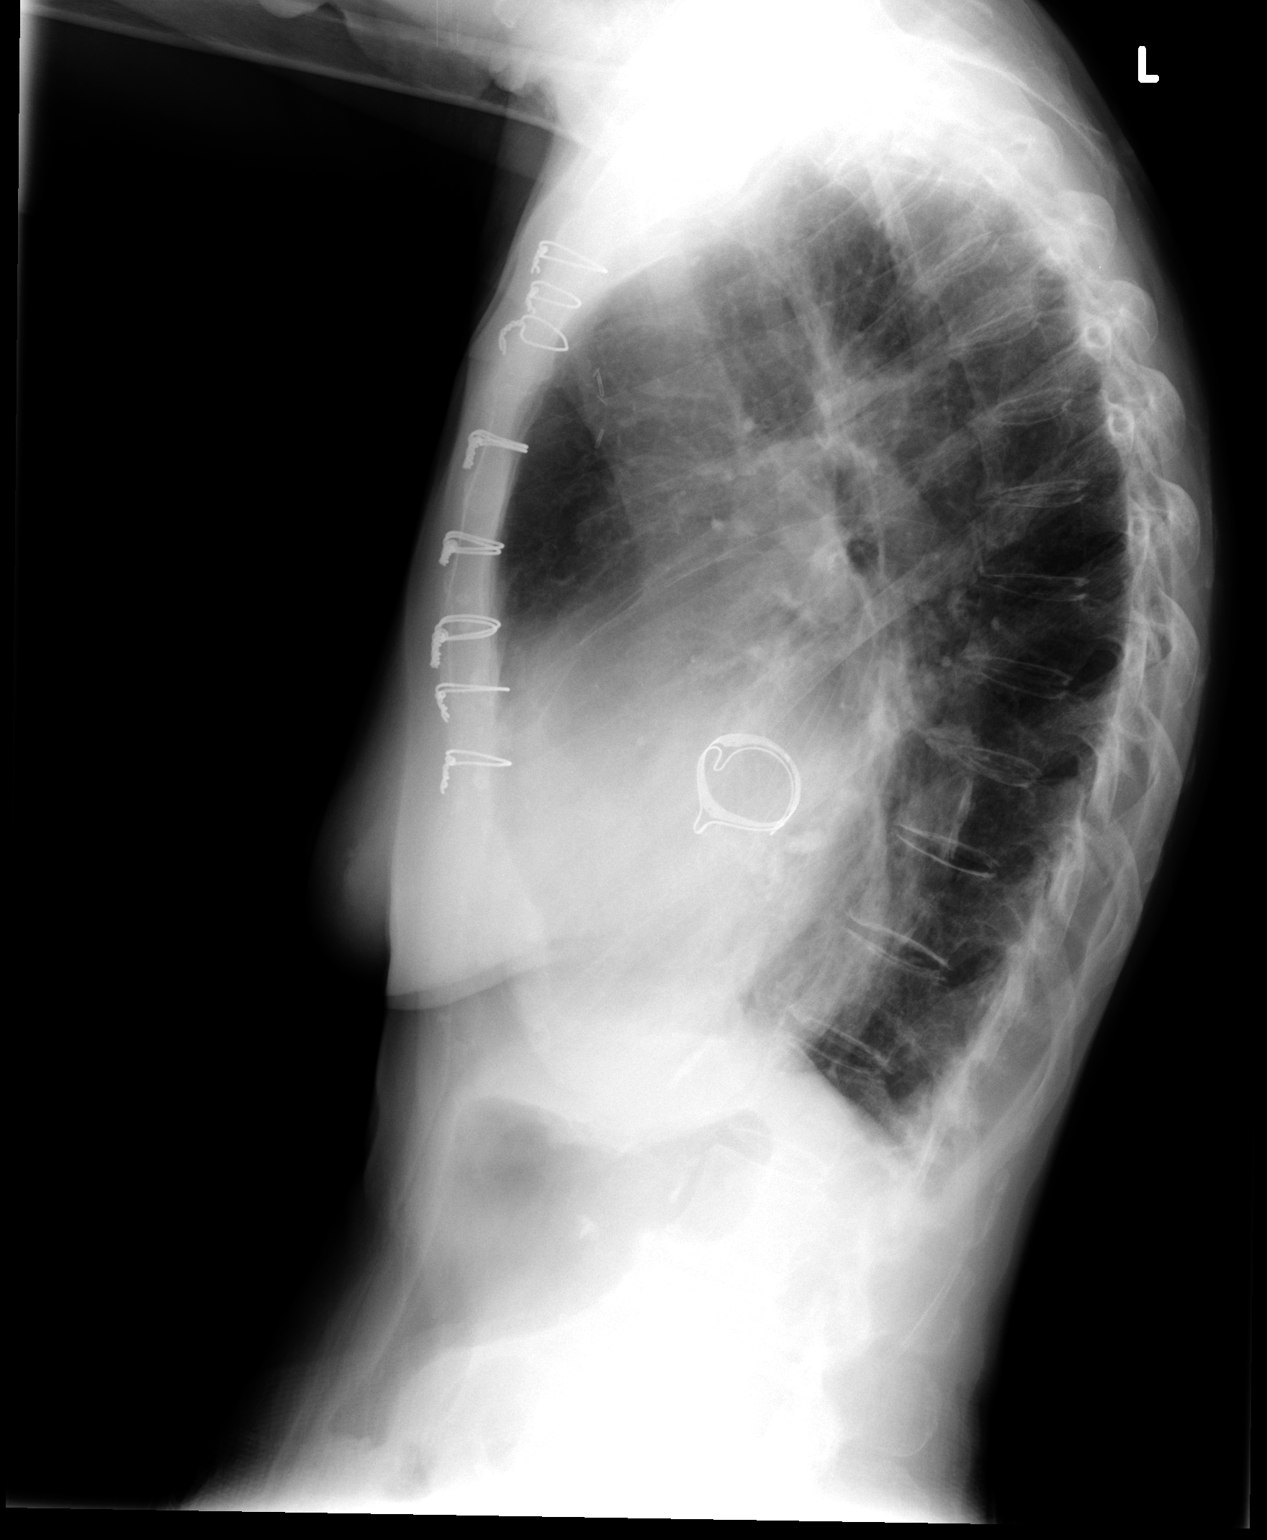

[2 of 2 positions shown; findings below may reference images not displayed]

FINDINGS: Trachea is midline.  Cardiopericardial silhouette has enlarged in the interval.  Pleuroparenchymal scarring is seen at the base of the right hemithorax.
IMPRESSION: 1.  Enlarged cardiopericardial silhouette.  Question pericardial effusion.
 2.  Probable pleuroparenchymal scar at the base of the right hemithorax.

## 2008-09-12 ENCOUNTER — Ambulatory Visit: Payer: Self-pay | Admitting: Internal Medicine

## 2008-09-12 LAB — CONVERTED CEMR LAB
INR: 2.5
Prothrombin Time: 19.2 s

## 2008-10-11 ENCOUNTER — Ambulatory Visit: Payer: Self-pay | Admitting: Internal Medicine

## 2008-10-11 DIAGNOSIS — N390 Urinary tract infection, site not specified: Secondary | ICD-10-CM

## 2008-10-11 LAB — CONVERTED CEMR LAB
Glucose, Urine, Semiquant: NEGATIVE
INR: 2.4
Prothrombin Time: 18.8 s
Specific Gravity, Urine: 1.015
pH: 7

## 2008-10-12 ENCOUNTER — Telehealth: Payer: Self-pay | Admitting: Family Medicine

## 2008-10-16 ENCOUNTER — Encounter: Payer: Self-pay | Admitting: Internal Medicine

## 2008-10-30 ENCOUNTER — Encounter: Payer: Self-pay | Admitting: Internal Medicine

## 2008-11-04 ENCOUNTER — Ambulatory Visit: Payer: Self-pay | Admitting: Cardiovascular Disease

## 2008-11-05 LAB — CONVERTED CEMR LAB
AST: 46 units/L — ABNORMAL HIGH (ref 0–37)
Albumin: 3.8 g/dL (ref 3.5–5.2)
TSH: 5.71 microintl units/mL — ABNORMAL HIGH (ref 0.35–5.50)

## 2008-11-27 ENCOUNTER — Ambulatory Visit: Payer: Self-pay | Admitting: Internal Medicine

## 2008-11-27 DIAGNOSIS — E038 Other specified hypothyroidism: Secondary | ICD-10-CM | POA: Insufficient documentation

## 2008-11-28 ENCOUNTER — Encounter: Payer: Self-pay | Admitting: Internal Medicine

## 2008-11-28 ENCOUNTER — Ambulatory Visit: Payer: Self-pay | Admitting: Internal Medicine

## 2008-11-28 DIAGNOSIS — I472 Ventricular tachycardia, unspecified: Secondary | ICD-10-CM | POA: Insufficient documentation

## 2008-11-28 DIAGNOSIS — Z95810 Presence of automatic (implantable) cardiac defibrillator: Secondary | ICD-10-CM | POA: Insufficient documentation

## 2008-12-09 ENCOUNTER — Ambulatory Visit: Payer: Self-pay | Admitting: Diagnostic Radiology

## 2008-12-09 ENCOUNTER — Emergency Department (HOSPITAL_BASED_OUTPATIENT_CLINIC_OR_DEPARTMENT_OTHER): Admission: EM | Admit: 2008-12-09 | Discharge: 2008-12-09 | Payer: Self-pay | Admitting: Emergency Medicine

## 2008-12-09 ENCOUNTER — Telehealth: Payer: Self-pay | Admitting: Internal Medicine

## 2009-01-01 ENCOUNTER — Ambulatory Visit: Payer: Self-pay | Admitting: Internal Medicine

## 2009-01-01 LAB — CONVERTED CEMR LAB
ALT: 48 units/L — ABNORMAL HIGH (ref 0–35)
Bilirubin, Direct: 0.1 mg/dL (ref 0.0–0.3)
TSH: 2.97 microintl units/mL (ref 0.35–5.50)
Total Protein: 7.2 g/dL (ref 6.0–8.3)

## 2009-01-09 ENCOUNTER — Ambulatory Visit: Payer: Self-pay | Admitting: Internal Medicine

## 2009-01-10 ENCOUNTER — Encounter: Payer: Self-pay | Admitting: Internal Medicine

## 2009-01-30 ENCOUNTER — Encounter: Payer: Self-pay | Admitting: Internal Medicine

## 2009-01-31 ENCOUNTER — Ambulatory Visit: Payer: Self-pay | Admitting: Internal Medicine

## 2009-01-31 LAB — CONVERTED CEMR LAB: INR: 2

## 2009-02-03 IMAGING — CR DG CHEST 2V
2 series · 2 of 2 positions shown · non-contrast
Comparison: 01/24/07.

CLINICAL DATA: 80-year-old female with congestive heart failure.
 CHEST - 2 VIEW:

[w chest pa *]
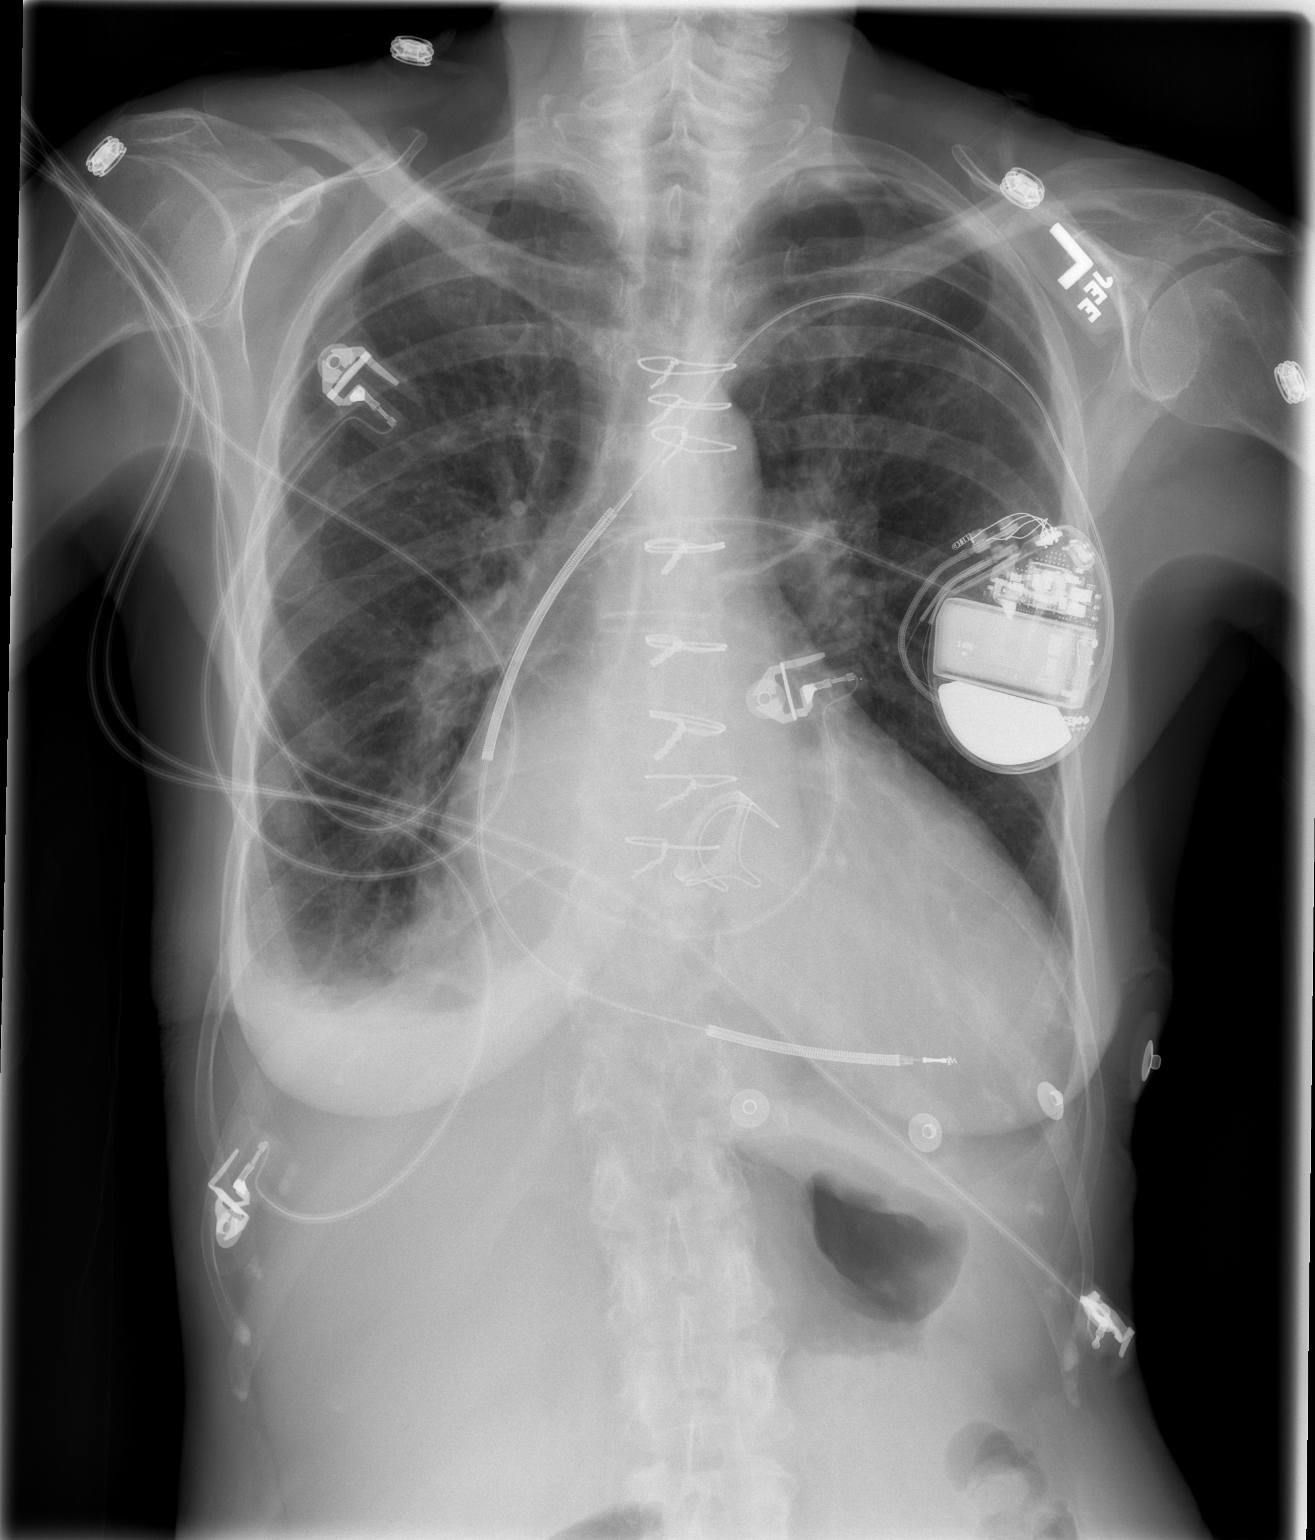

[w chest lat]
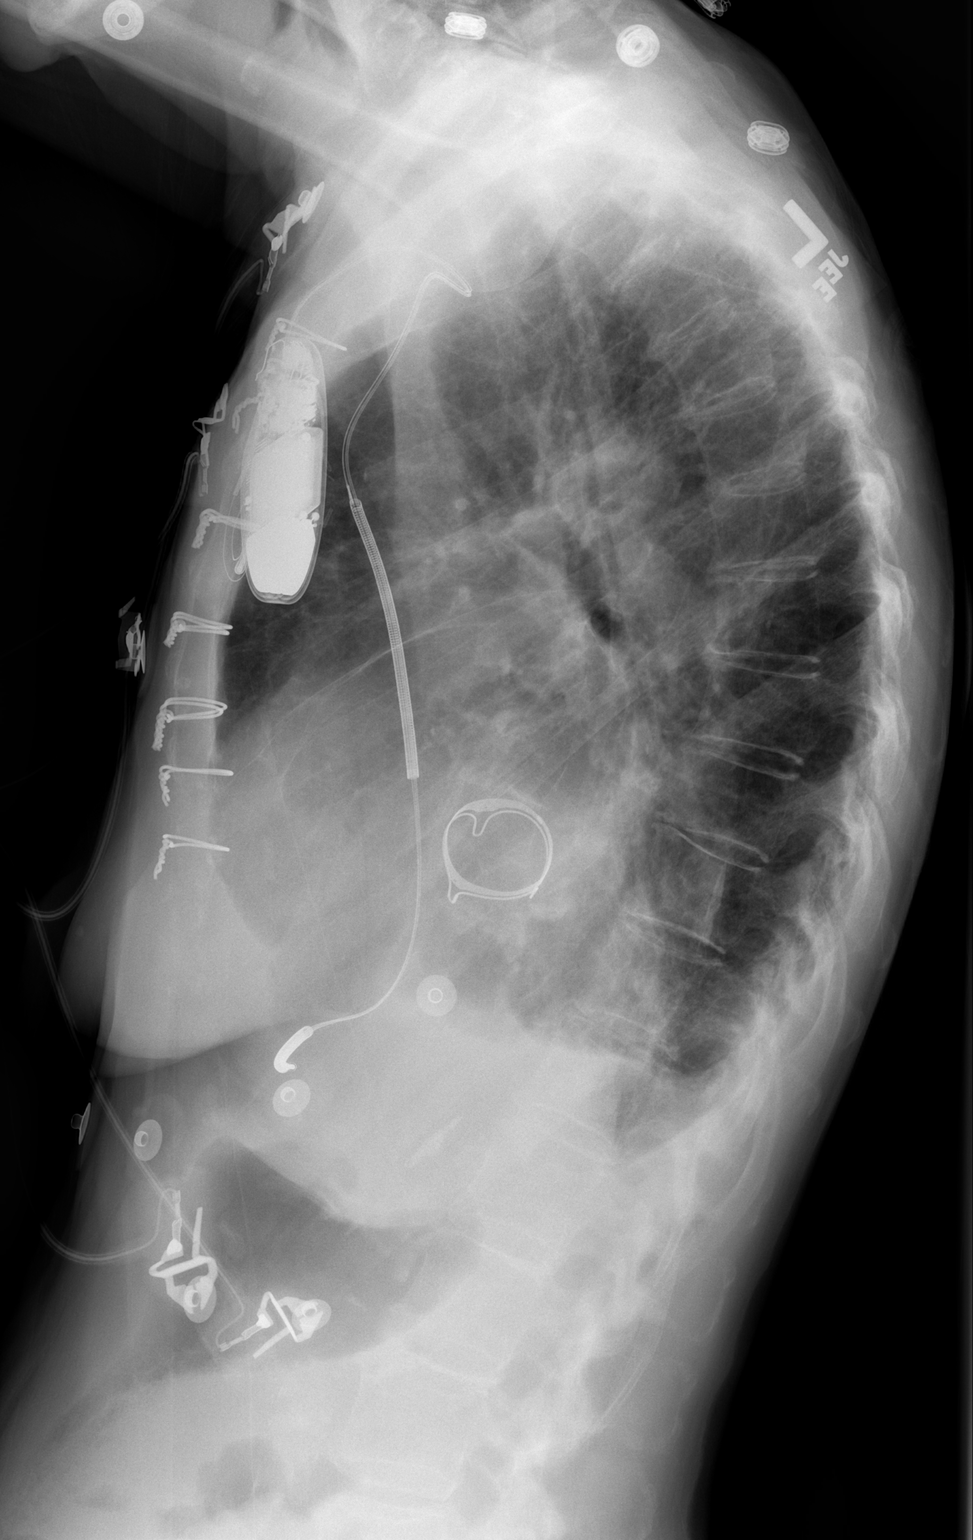

[2 of 2 positions shown; findings below may reference images not displayed]

FINDINGS: The cardiopericardial silhouette remains enlarged. The patient is status post median sternotomy for aortic valve replacement. An AICD is in stable position. Bilateral effusions have increased, particularly on the right.  There is no definite edema.
IMPRESSION: Stable cardiomegaly with slight increase in bilateral pleural effusions, right greater than left.

## 2009-02-17 ENCOUNTER — Encounter: Payer: Self-pay | Admitting: Internal Medicine

## 2009-02-24 ENCOUNTER — Encounter: Payer: Self-pay | Admitting: Internal Medicine

## 2009-03-03 ENCOUNTER — Ambulatory Visit: Payer: Self-pay | Admitting: Internal Medicine

## 2009-03-15 ENCOUNTER — Observation Stay (HOSPITAL_COMMUNITY): Admission: EM | Admit: 2009-03-15 | Discharge: 2009-03-15 | Payer: Self-pay | Admitting: Emergency Medicine

## 2009-03-16 ENCOUNTER — Encounter: Payer: Self-pay | Admitting: Internal Medicine

## 2009-03-17 ENCOUNTER — Telehealth: Payer: Self-pay | Admitting: Internal Medicine

## 2009-03-25 ENCOUNTER — Ambulatory Visit: Payer: Self-pay | Admitting: Internal Medicine

## 2009-03-25 LAB — CONVERTED CEMR LAB
Bilirubin Urine: NEGATIVE
Urobilinogen, UA: 0.2

## 2009-03-30 ENCOUNTER — Encounter: Payer: Self-pay | Admitting: Internal Medicine

## 2009-03-31 ENCOUNTER — Telehealth: Payer: Self-pay | Admitting: Internal Medicine

## 2009-03-31 ENCOUNTER — Emergency Department (HOSPITAL_BASED_OUTPATIENT_CLINIC_OR_DEPARTMENT_OTHER): Admission: EM | Admit: 2009-03-31 | Discharge: 2009-04-01 | Payer: Self-pay | Admitting: Emergency Medicine

## 2009-03-31 ENCOUNTER — Telehealth (INDEPENDENT_AMBULATORY_CARE_PROVIDER_SITE_OTHER): Payer: Self-pay | Admitting: *Deleted

## 2009-04-01 ENCOUNTER — Telehealth: Payer: Self-pay | Admitting: Internal Medicine

## 2009-04-02 ENCOUNTER — Encounter (INDEPENDENT_AMBULATORY_CARE_PROVIDER_SITE_OTHER): Payer: Self-pay | Admitting: *Deleted

## 2009-04-02 ENCOUNTER — Ambulatory Visit: Payer: Self-pay | Admitting: Internal Medicine

## 2009-04-02 LAB — CONVERTED CEMR LAB
Basophils Relative: 0 % (ref 0.0–3.0)
Eosinophils Relative: 0.8 % (ref 0.0–5.0)
HCT: 39.7 % (ref 36.0–46.0)
Hemoglobin: 13.6 g/dL (ref 12.0–15.0)
Lymphs Abs: 0.6 10*3/uL — ABNORMAL LOW (ref 0.7–4.0)
Monocytes Relative: 3.7 % (ref 3.0–12.0)
Neutro Abs: 5.9 10*3/uL (ref 1.4–7.7)
TSH: 2.92 microintl units/mL (ref 0.35–5.50)
WBC: 6.9 10*3/uL (ref 4.5–10.5)

## 2009-04-03 ENCOUNTER — Ambulatory Visit: Payer: Self-pay | Admitting: Cardiovascular Disease

## 2009-04-10 ENCOUNTER — Telehealth (INDEPENDENT_AMBULATORY_CARE_PROVIDER_SITE_OTHER): Payer: Self-pay | Admitting: *Deleted

## 2009-04-18 ENCOUNTER — Ambulatory Visit: Payer: Self-pay | Admitting: Internal Medicine

## 2009-04-19 ENCOUNTER — Encounter: Payer: Self-pay | Admitting: Internal Medicine

## 2009-05-05 ENCOUNTER — Ambulatory Visit: Payer: Self-pay | Admitting: Internal Medicine

## 2009-05-19 ENCOUNTER — Ambulatory Visit: Payer: Self-pay | Admitting: Internal Medicine

## 2009-05-30 ENCOUNTER — Emergency Department (HOSPITAL_COMMUNITY): Admission: EM | Admit: 2009-05-30 | Discharge: 2009-05-30 | Payer: Self-pay | Admitting: Emergency Medicine

## 2009-05-30 ENCOUNTER — Encounter: Payer: Self-pay | Admitting: Internal Medicine

## 2009-06-18 ENCOUNTER — Ambulatory Visit: Payer: Self-pay | Admitting: Internal Medicine

## 2009-06-18 LAB — CONVERTED CEMR LAB
CO2: 31 meq/L (ref 19–32)
Calcium: 8.9 mg/dL (ref 8.4–10.5)
Chloride: 100 meq/L (ref 96–112)
Eosinophils Relative: 1 % (ref 0.0–5.0)
Glucose, Bld: 85 mg/dL (ref 70–99)
HCT: 37.3 % (ref 36.0–46.0)
Hemoglobin: 12.5 g/dL (ref 12.0–15.0)
Lymphs Abs: 0.9 10*3/uL (ref 0.7–4.0)
Monocytes Relative: 9.4 % (ref 3.0–12.0)
Neutro Abs: 3.8 10*3/uL (ref 1.4–7.7)
Sodium: 137 meq/L (ref 135–145)
T3, Free: 2.1 pg/mL — ABNORMAL LOW (ref 2.3–4.2)
WBC: 5.3 10*3/uL (ref 4.5–10.5)

## 2009-06-19 ENCOUNTER — Encounter: Payer: Self-pay | Admitting: Internal Medicine

## 2009-07-21 ENCOUNTER — Ambulatory Visit: Payer: Self-pay | Admitting: Internal Medicine

## 2009-07-27 ENCOUNTER — Encounter: Payer: Self-pay | Admitting: Internal Medicine

## 2009-08-04 ENCOUNTER — Encounter: Payer: Self-pay | Admitting: Internal Medicine

## 2009-08-04 ENCOUNTER — Ambulatory Visit: Payer: Self-pay | Admitting: Cardiovascular Disease

## 2009-08-04 ENCOUNTER — Ambulatory Visit: Payer: Self-pay

## 2009-08-18 ENCOUNTER — Encounter: Payer: Self-pay | Admitting: Internal Medicine

## 2009-08-22 ENCOUNTER — Ambulatory Visit: Payer: Self-pay | Admitting: Internal Medicine

## 2009-08-22 LAB — CONVERTED CEMR LAB: Prothrombin Time: 16.5 s

## 2009-09-03 ENCOUNTER — Encounter: Payer: Self-pay | Admitting: Internal Medicine

## 2009-09-18 ENCOUNTER — Ambulatory Visit: Payer: Self-pay | Admitting: Internal Medicine

## 2009-09-18 LAB — CONVERTED CEMR LAB
HDL: 85 mg/dL (ref >39.00)
Pro B Natriuretic peptide (BNP): 137 pg/mL — ABNORMAL HIGH (ref 0.0–100.0)
TSH: 1.99 microintl units/mL (ref 0.35–5.50)

## 2009-10-16 ENCOUNTER — Ambulatory Visit: Payer: Self-pay | Admitting: Internal Medicine

## 2009-10-16 LAB — CONVERTED CEMR LAB: Prothrombin Time: 17.1 s

## 2009-11-10 ENCOUNTER — Encounter: Payer: Self-pay | Admitting: Internal Medicine

## 2009-11-18 ENCOUNTER — Ambulatory Visit: Payer: Self-pay | Admitting: Internal Medicine

## 2009-11-25 ENCOUNTER — Ambulatory Visit: Payer: Self-pay | Admitting: Internal Medicine

## 2009-12-03 ENCOUNTER — Encounter: Payer: Self-pay | Admitting: Internal Medicine

## 2009-12-16 ENCOUNTER — Encounter: Payer: Self-pay | Admitting: Internal Medicine

## 2009-12-17 ENCOUNTER — Encounter: Payer: Self-pay | Admitting: Internal Medicine

## 2009-12-18 ENCOUNTER — Ambulatory Visit: Payer: Self-pay | Admitting: Internal Medicine

## 2009-12-23 ENCOUNTER — Encounter: Payer: Self-pay | Admitting: Internal Medicine

## 2010-01-14 ENCOUNTER — Encounter: Payer: Self-pay | Admitting: Internal Medicine

## 2010-01-16 ENCOUNTER — Ambulatory Visit: Payer: Self-pay | Admitting: Internal Medicine

## 2010-01-27 ENCOUNTER — Encounter: Payer: Self-pay | Admitting: Internal Medicine

## 2010-02-03 ENCOUNTER — Telehealth: Payer: Self-pay | Admitting: Internal Medicine

## 2010-02-16 ENCOUNTER — Ambulatory Visit: Payer: Self-pay | Admitting: Cardiovascular Disease

## 2010-02-16 ENCOUNTER — Encounter: Payer: Self-pay | Admitting: Internal Medicine

## 2010-02-20 ENCOUNTER — Encounter: Payer: Self-pay | Admitting: Cardiovascular Disease

## 2010-02-24 DIAGNOSIS — R740 Nonspecific elevation of levels of transaminase and lactic acid dehydrogenase [LDH]: Secondary | ICD-10-CM

## 2010-02-24 LAB — CONVERTED CEMR LAB
ALT: 124 units/L — ABNORMAL HIGH (ref 0–35)
AST: 79 units/L — ABNORMAL HIGH (ref 0–37)
Albumin: 4 g/dL (ref 3.5–5.2)
Alkaline Phosphatase: 55 units/L (ref 39–117)
Bilirubin, Direct: 0.2 mg/dL (ref 0.0–0.3)
CO2: 30 meq/L (ref 19–32)
Calcium: 9.1 mg/dL (ref 8.4–10.5)
Chloride: 97 meq/L (ref 96–112)
Glucose, Bld: 83 mg/dL (ref 70–99)
Potassium: 4.3 meq/L (ref 3.5–5.1)
Sodium: 138 meq/L (ref 135–145)
Total Protein: 7.1 g/dL (ref 6.0–8.3)

## 2010-02-27 ENCOUNTER — Encounter: Admission: RE | Admit: 2010-02-27 | Discharge: 2010-02-27 | Payer: Self-pay | Admitting: Internal Medicine

## 2010-03-06 ENCOUNTER — Encounter: Payer: Self-pay | Admitting: Internal Medicine

## 2010-03-16 ENCOUNTER — Ambulatory Visit: Payer: Self-pay | Admitting: Internal Medicine

## 2010-03-18 ENCOUNTER — Encounter: Payer: Self-pay | Admitting: Cardiovascular Disease

## 2010-03-18 ENCOUNTER — Telehealth: Payer: Self-pay | Admitting: Cardiovascular Disease

## 2010-03-24 ENCOUNTER — Telehealth: Payer: Self-pay | Admitting: *Deleted

## 2010-03-24 ENCOUNTER — Encounter: Payer: Self-pay | Admitting: Internal Medicine

## 2010-03-26 ENCOUNTER — Telehealth: Payer: Self-pay | Admitting: *Deleted

## 2010-03-27 LAB — CONVERTED CEMR LAB
ALT: 110 units/L — ABNORMAL HIGH (ref 0–35)
AST: 82 units/L — ABNORMAL HIGH (ref 0–37)
Albumin: 3.5 g/dL (ref 3.5–5.2)
Alkaline Phosphatase: 55 units/L (ref 39–117)
Total Protein: 6.6 g/dL (ref 6.0–8.3)

## 2010-04-01 ENCOUNTER — Telehealth: Payer: Self-pay | Admitting: Cardiovascular Disease

## 2010-04-17 ENCOUNTER — Ambulatory Visit: Payer: Self-pay | Admitting: Internal Medicine

## 2010-04-17 LAB — CONVERTED CEMR LAB: INR: 1.6

## 2010-05-13 ENCOUNTER — Encounter: Payer: Self-pay | Admitting: Cardiovascular Disease

## 2010-05-14 ENCOUNTER — Ambulatory Visit: Payer: Self-pay

## 2010-05-14 ENCOUNTER — Encounter: Payer: Self-pay | Admitting: Internal Medicine

## 2010-05-15 ENCOUNTER — Ambulatory Visit: Payer: Self-pay | Admitting: Internal Medicine

## 2010-05-19 ENCOUNTER — Encounter: Payer: Self-pay | Admitting: Internal Medicine

## 2010-05-28 ENCOUNTER — Ambulatory Visit
Admission: RE | Admit: 2010-05-28 | Discharge: 2010-05-28 | Payer: Self-pay | Source: Home / Self Care | Attending: Internal Medicine | Admitting: Internal Medicine

## 2010-05-28 LAB — CONVERTED CEMR LAB: INR: 2.1

## 2010-06-17 ENCOUNTER — Ambulatory Visit
Admission: RE | Admit: 2010-06-17 | Discharge: 2010-06-17 | Payer: Self-pay | Source: Home / Self Care | Attending: Internal Medicine | Admitting: Internal Medicine

## 2010-06-17 ENCOUNTER — Other Ambulatory Visit: Payer: Self-pay | Admitting: Internal Medicine

## 2010-06-17 LAB — CBC WITH DIFFERENTIAL/PLATELET
Basophils Absolute: 0 10*3/uL (ref 0.0–0.1)
Basophils Relative: 0.6 % (ref 0.0–3.0)
Eosinophils Absolute: 0.2 10*3/uL (ref 0.0–0.7)
Eosinophils Relative: 3.3 % (ref 0.0–5.0)
HCT: 37.6 % (ref 36.0–46.0)
Hemoglobin: 13.1 g/dL (ref 12.0–15.0)
Lymphocytes Relative: 25.8 % (ref 12.0–46.0)
Lymphs Abs: 1.6 10*3/uL (ref 0.7–4.0)
MCHC: 34.7 g/dL (ref 30.0–36.0)
MCV: 94.6 fl (ref 78.0–100.0)
Monocytes Absolute: 0.6 10*3/uL (ref 0.1–1.0)
Monocytes Relative: 9.8 % (ref 3.0–12.0)
Neutro Abs: 3.8 10*3/uL (ref 1.4–7.7)
Neutrophils Relative %: 60.5 % (ref 43.0–77.0)
Platelets: 185 10*3/uL (ref 150.0–400.0)
RBC: 3.97 Mil/uL (ref 3.87–5.11)
RDW: 13.5 % (ref 11.5–14.6)
WBC: 6.2 10*3/uL (ref 4.5–10.5)

## 2010-06-17 LAB — HEPATIC FUNCTION PANEL
ALT: 62 U/L — ABNORMAL HIGH (ref 0–35)
AST: 41 U/L — ABNORMAL HIGH (ref 0–37)
Albumin: 3.8 g/dL (ref 3.5–5.2)
Alkaline Phosphatase: 60 U/L (ref 39–117)
Bilirubin, Direct: 0.1 mg/dL (ref 0.0–0.3)
Total Bilirubin: 0.9 mg/dL (ref 0.3–1.2)
Total Protein: 7.2 g/dL (ref 6.0–8.3)

## 2010-06-17 LAB — LIPID PANEL
Cholesterol: 286 mg/dL — ABNORMAL HIGH (ref 0–200)
HDL: 74.8 mg/dL (ref >39.00)
Total CHOL/HDL Ratio: 4
Triglycerides: 56 mg/dL (ref 0.0–149.0)
VLDL: 11.2 mg/dL (ref 0.0–40.0)

## 2010-06-17 LAB — BRAIN NATRIURETIC PEPTIDE: Pro B Natriuretic peptide (BNP): 173.7 pg/mL — ABNORMAL HIGH (ref 0.0–100.0)

## 2010-06-17 LAB — LDL CHOLESTEROL, DIRECT: Direct LDL: 202 mg/dL

## 2010-06-17 LAB — CONVERTED CEMR LAB: INR: 1.8

## 2010-06-18 ENCOUNTER — Ambulatory Visit: Admit: 2010-06-18 | Payer: Self-pay | Admitting: Cardiovascular Disease

## 2010-06-20 ENCOUNTER — Encounter: Payer: Self-pay | Admitting: Cardiothoracic Surgery

## 2010-06-25 ENCOUNTER — Encounter: Payer: Self-pay | Admitting: Internal Medicine

## 2010-06-29 ENCOUNTER — Encounter: Payer: Self-pay | Admitting: Internal Medicine

## 2010-07-02 NOTE — Assessment & Plan Note (Signed)
Summary: 3 mo rov/mm   Vital Signs:  Patient profile:   77 year old female Height:      62 inches Weight:      107.5 pounds BMI:     19.73 Temp:     98.2 degrees F oral Pulse rate:   68 / minute Resp:     14 per minute BP sitting:   110 / 60  (left arm)  Vitals Entered By: Willy Eddy, LPN (September 18, 2009 9:18 AM) CC: roa, CHF Management   Primary Care Provider:  Stacie Glaze MD  CC:  roa and CHF Management.  History of Present Illness: weigth remainns stable and her appittee is good alert and oriented and interactive smiling  monitering lanbs for thyroid and lipids as well ands chf complete forms for nursing home I have spent greater that 45 min face to face evaluating this patient and over 1/2 of this time was in councilling   CHF History:      Daily weights are being checked.  She understands fluid management and sodium restriction.  Other comments include: she has gained dry weight and we have reset the measurements.     Preventive Screening-Counseling & Management  Alcohol-Tobacco     Smoking Status: quit     Year Quit: 1953     Passive Smoke Exposure: no  Problems Prior to Update: 1)  Automatic Implantable Cardiac Defibrillator Situ  (ICD-V45.02) 2)  Paroxysmal Ventricular Tachycardia  (ICD-427.1) 3)  Hypothyroidism, Secondary  (ICD-244.8) 4)  Uti's, Recurrent  (ICD-599.0) 5)  Supraventricular Tachycardia, Paroxysmal, Hx of  (ICD-V12.59) 6)  Syncope  (ICD-780.2) 7)  Systolic Heart Failure, Chronic  (ICD-428.22) 8)  Mitral Stenosis/ Insufficiency, Non-rheumatic  (ICD-424.0) 9)  Hordeolum  (ICD-373.11) 10)  Hyperlipidemia, Type II  (ICD-272.4) 11)  Encounter For Long-term Use of Other Medications  (ICD-V58.69) 12)  Unspecified Hypotension  (ICD-458.9) 13)  Coumadin Therapy  (ICD-V58.61) 14)  Encounter For Therapeutic Drug Monitoring  (ICD-V58.83) 15)  Symptom, Memory Loss  (ICD-780.93) 16)  Advef, Drug/medicinal/biological Subst Nos   (ICD-995.20) 17)  Cough, Chronic  (ICD-786.2) 18)  Weight Loss, Abnormal  (ICD-783.21) 19)  Cardiomyopathy  (ICD-425.4) 20)  Atrial Fibrillation  (ICD-427.31)  Current Problems (verified): 1)  Automatic Implantable Cardiac Defibrillator Situ  (ICD-V45.02) 2)  Paroxysmal Ventricular Tachycardia  (ICD-427.1) 3)  Hypothyroidism, Secondary  (ICD-244.8) 4)  Uti's, Recurrent  (ICD-599.0) 5)  Supraventricular Tachycardia, Paroxysmal, Hx of  (ICD-V12.59) 6)  Syncope  (ICD-780.2) 7)  Systolic Heart Failure, Chronic  (ICD-428.22) 8)  Mitral Stenosis/ Insufficiency, Non-rheumatic  (ICD-424.0) 9)  Hordeolum  (ICD-373.11) 10)  Hyperlipidemia, Type II  (ICD-272.4) 11)  Encounter For Long-term Use of Other Medications  (ICD-V58.69) 12)  Unspecified Hypotension  (ICD-458.9) 13)  Coumadin Therapy  (ICD-V58.61) 14)  Encounter For Therapeutic Drug Monitoring  (ICD-V58.83) 15)  Symptom, Memory Loss  (ICD-780.93) 16)  Advef, Drug/medicinal/biological Subst Nos  (ICD-995.20) 17)  Cough, Chronic  (ICD-786.2) 18)  Weight Loss, Abnormal  (ICD-783.21) 19)  Cardiomyopathy  (ICD-425.4) 20)  Atrial Fibrillation  (ICD-427.31)  Medications Prior to Update: 1)  Coumadin 2.5 Mg  Tabs (Warfarin Sodium) .... As Directed 2.5 Daily Except One Time A Week 5mg . 2)  Multivitamins   Tabs (Multiple Vitamin) .... Take One Tablet By Mouth Daily 3)  Amiodarone Hcl 200 Mg  Tabs (Amiodarone Hcl) .... One Daily 4)  Digoxin 0.125 Mg  Tabs (Digoxin) .... Once Daily 5)  Lasix 40 Mg  Tabs (Furosemide) .... Once Daily  6)  Fish Oil Concentrate 1000 Mg Caps (Omega-3 Fatty Acids) .... Two Caps Two Times A Day 7)  Tylenol Extra Strength 500 Mg Tabs (Acetaminophen) .... As Needed 8)  Saline Nasal Spray 0.65 % Soln (Saline) .... As Needed 9)  Diovan 40 Mg Tabs (Valsartan) .... Take 1 Tablet By Mouth Once A Day 10)  Ensure  Liqd (Nutritional Supplements) .... Drink One Can With Meds Daily 11)  Levothyroxine Sodium 25 Mcg Tabs  (Levothyroxine Sodium) .... One By Mouth Daly 12)  Ludens Cough Drops 3-60 Mg Lozg (Menthol-Ascorbic Acid) .... As Needed 13)  Macrodantin 50 Mg Caps (Nitrofurantoin Macrocrystal) .... One By Mouth Daily 14)  Amoxicillin 500 Mg Caps (Amoxicillin) .... 4 1 Hour Prior To Dental Work  Current Medications (verified): 1)  Coumadin 2.5 Mg  Tabs (Warfarin Sodium) .... As Directed 2.5 Daily Except One Time A Week 5mg . 2)  Multivitamins   Tabs (Multiple Vitamin) .... Take One Tablet By Mouth Daily 3)  Amiodarone Hcl 200 Mg  Tabs (Amiodarone Hcl) .... One Daily 4)  Digoxin 0.125 Mg  Tabs (Digoxin) .... Once Daily 5)  Lasix 40 Mg  Tabs (Furosemide) .... Once Daily 6)  Fish Oil Concentrate 1000 Mg Caps (Omega-3 Fatty Acids) .... Two Caps Two Times A Day 7)  Tylenol Extra Strength 500 Mg Tabs (Acetaminophen) .... As Needed 8)  Saline Nasal Spray 0.65 % Soln (Saline) .... As Needed 9)  Diovan 40 Mg Tabs (Valsartan) .... Take 1 Tablet By Mouth Once A Day 10)  Ensure  Liqd (Nutritional Supplements) .... Drink One Can With Meds Daily 11)  Levothyroxine Sodium 25 Mcg Tabs (Levothyroxine Sodium) .... One By Mouth Daly 12)  Ludens Cough Drops 3-60 Mg Lozg (Menthol-Ascorbic Acid) .... As Needed 13)  Amoxicillin 500 Mg Caps (Amoxicillin) .... 4 1 Hour Prior To Dental Work  Allergies (verified): 1)  Zocor  Past History:  Family History: Last updated: January 10, 2007 father died from  age  at 23 mother died from   ? cause         at 70  Social History: Last updated: 04/16/2008 Alcohol use-no Former Smoker quit in 1953 widowed Drug use-no Regular exercise-yes Lives [facility]  Risk Factors: Caffeine Use: 0 (04/19/2007) Exercise: yes (01-10-2007)  Risk Factors: Smoking Status: quit (09/18/2009) Passive Smoke Exposure: no (09/18/2009)  Past medical, surgical, family and social histories (including risk factors) reviewed, and no changes noted (except as noted below).  Past Medical  History: Reviewed history from 04/03/2009 and no changes required. 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 Hemorrhoidectomy Tonsillectomy Mitral valve replacement 2006 ( cow valve) implantable defibrillator  Family History: Reviewed history from 01-10-07 and no changes required. father died from  age  at 73 mother died from   ? cause         at 26  Social History: Reviewed history from 04/16/2008 and no changes required. Alcohol use-no Former Smoker quit in 1953 widowed Drug use-no Regular exercise-yes Lives [facility]  Review of Systems  The patient denies anorexia, fever, weight loss, weight gain, vision loss, decreased hearing, hoarseness, chest pain, syncope, dyspnea on exertion, peripheral edema, prolonged cough, headaches, hemoptysis, abdominal pain, melena, hematochezia, severe indigestion/heartburn, hematuria, incontinence, genital sores, muscle weakness, suspicious skin lesions, transient blindness,  difficulty walking, depression, unusual weight change, abnormal bleeding, enlarged lymph nodes, angioedema, and breast masses.    Physical Exam  General:  alert, underweight appearing, and cachetic.   Head:  normocephalic and atraumatic.   Eyes:  pupils equal and pupils round.   Ears:  R ear normal and L ear normal. r ight pinna is erythematous Nose:  no external deformity and no nasal discharge.   Mouth:  pharynx pink and moist and no erythema.   Neck:  No deformities, masses, or tenderness noted. Lungs:  Normal respiratory effort, chest expands symmetrically. Lungs are clear to auscultation, no crackles or wheezes. Heart:  normal rate, regular rhythm, no rub, and  no JVD.   Abdomen:  Bowel sounds positive,abdomen soft and non-tender without masses, organomegaly or hernias noted. Extremities:  trace left pedal edema and trace right pedal edema.   Neurologic:  alert & oriented X3 and finger-to-nose normal.     Impression & Recommendations:  Problem # 1:  SYSTOLIC HEART FAILURE, CHRONIC (ICD-428.22)  stable and weight is controlled Her updated medication list for this problem includes:    Coumadin 2.5 Mg Tabs (Warfarin sodium) .Marland Kitchen... As directed 2.5 daily except one time a week 5mg .    Digoxin 0.125 Mg Tabs (Digoxin) ..... Once daily    Lasix 40 Mg Tabs (Furosemide) ..... Once daily    Diovan 40 Mg Tabs (Valsartan) .Marland Kitchen... Take 1 tablet by mouth once a day  Orders: TLB-BNP (B-Natriuretic Peptide) (83880-BNPR)  Problem # 2:  CARDIOMYOPATHY (ICD-425.4) stable  Problem # 3:  HYPOTHYROIDISM, SECONDARY (ICD-244.8)  due lipid and thyroid Her updated medication list for this problem includes:    Levothyroxine Sodium 25 Mcg Tabs (Levothyroxine sodium) ..... One by mouth daly  Labs Reviewed: TSH: 1.96 (06/18/2009)    Chol: 236 (08/21/2008)   HDL: 74.10 (08/21/2008)   LDL: 79 (07/13/2006)   TG: 94 (07/13/2006)  Orders: TLB-TSH (Thyroid Stimulating Hormone) (84443-TSH)  Problem # 4:  PAROXYSMAL VENTRICULAR TACHYCARDIA (ICD-427.1) protime today Her updated medication list for this problem includes:    Coumadin 2.5 Mg Tabs (Warfarin sodium) .Marland Kitchen... As directed 2.5 daily except one time a week 5mg .    Amiodarone Hcl 200 Mg Tabs (Amiodarone hcl) ..... One daily  Problem # 5:  COUMADIN THERAPY (ICD-V58.61) moniter and dose coumandin today see addendum  Complete Medication List: 1)  Coumadin 2.5 Mg Tabs (Warfarin sodium) .... As directed 2.5 daily except one time a week 5mg . 2)  Multivitamins Tabs (Multiple vitamin) .... Take one tablet by mouth daily 3)  Amiodarone Hcl 200 Mg Tabs (Amiodarone hcl) .... One daily 4)  Digoxin 0.125 Mg Tabs (Digoxin)  .... Once daily 5)  Lasix 40 Mg Tabs (Furosemide) .... Once daily 6)  Fish Oil Concentrate 1000 Mg Caps (Omega-3 fatty acids) .... Two caps two times a day 7)  Tylenol Extra Strength 500 Mg Tabs (Acetaminophen) .... As needed 8)  Saline Nasal Spray 0.65 % Soln (Saline) .... As needed 9)  Diovan 40 Mg Tabs (Valsartan) .... Take 1 tablet by mouth once a day 10)  Ensure Liqd (Nutritional supplements) .... Drink one can with meds daily 11)  Levothyroxine Sodium 25 Mcg Tabs (Levothyroxine sodium) .... One by mouth daly 12)  Ludens Cough Drops 3-60 Mg Lozg (Menthol-ascorbic acid) .... As needed 13)  Amoxicillin 500 Mg Caps (Amoxicillin) .... 4 1 hour prior to dental work  Other Orders: Fingerstick [id number]) Protime (30160FU) TLB-Cholesterol, HDL (83718-HDL) TLB-Cholesterol, Direct LDL (83721-DIRLDL) TLB-Cholesterol, Total (82465-CHO) Venipuncture (93235)  CHF Assessment/Plan:      The patient's current weight is 107.5 pounds.  Her previous weight was 106 pounds.  The patient's preferred weight ('dry weight') is 107 pounds.  The patient is not on an ACE-I due to Patient is NOW on ACE/arb.  The CHF education was provided to the patient and daughter.    Patient Instructions: 1)  Please schedule a follow-up appointment in 3 months.  Appended Document: Orders Update    Clinical Lists Changes  Observations: Added new observation of ABNORM BLEED: No (09/18/2009 12:45) Added new observation of COUMADIN CHG: No (09/18/2009 12:45) Added new observation of NEXT PT: 4 weeks (09/18/2009 12:45) Added new observation of CUR. REGIMEN: same dose (09/18/2009 12:45) Added new observation of COMMENTS2: Wynona Canes, CMA  September 18, 2009 12:46 PM  (09/18/2009 12:45) Added new observation of INR: 2.4  (09/18/2009 12:45) Added new observation of PT PATIENT: 19.0 s (09/18/2009 12:45)      Laboratory Results   Blood Tests   Date/Time Recieved: September 18, 2009 12:46 PM  Date/Time Reported: September 18, 2009 12:46 PM   PT: 19.0 s   (Normal Range: 10.6-13.4)  INR: 2.4   (Normal Range: 0.88-1.12   Therap INR: 2.0-3.5) Comments: Wynona Canes, CMA  September 18, 2009 12:46 PM       ANTICOAGULATION RECORD PREVIOUS REGIMEN & LAB RESULTS Anticoagulation Diagnosis:  Deep venous thrombosis on  01/02/2007 Previous INR Goal Range:  2.0-3.0 on  11/23/2007 Previous INR:  1.8 on  08/22/2009 Previous Coumadin Dose(mg):  5mg  only on sundays then 2.5mg  qd on  08/22/2009 Previous Regimen:  5mg  only on sundays then 2.5mg  qd on  08/22/2009 Previous Coagulation Comments:  Approved by Dr. Kirtland Bouchard on  01/01/2009  NEW REGIMEN & LAB RESULTS Current INR: 2.4 Regimen: same dose       Repeat testing in: 4 weeks MEDICATIONS COUMADIN 2.5 MG  TABS (WARFARIN SODIUM) As directed 2.5 daily except one time a week 5mg . MULTIVITAMINS   TABS (MULTIPLE VITAMIN) Take one tablet by mouth daily AMIODARONE HCL 200 MG  TABS (AMIODARONE HCL) one daily DIGOXIN 0.125 MG  TABS (DIGOXIN) once daily LASIX 40 MG  TABS (FUROSEMIDE) once daily FISH OIL CONCENTRATE 1000 MG CAPS (OMEGA-3 FATTY ACIDS) two caps two times a day TYLENOL EXTRA STRENGTH 500 MG TABS (ACETAMINOPHEN) as needed SALINE NASAL SPRAY 0.65 % SOLN (SALINE) as needed DIOVAN 40 MG TABS (VALSARTAN) Take 1 tablet by mouth once a day ENSURE  LIQD (NUTRITIONAL SUPPLEMENTS) drink one can with meds daily LEVOTHYROXINE SODIUM 25 MCG TABS (LEVOTHYROXINE SODIUM) ONE by mouth DALY LUDENS COUGH DROPS 3-60 MG LOZG (MENTHOL-ASCORBIC ACID) as needed AMOXICILLIN 500 MG CAPS (AMOXICILLIN) 4 1 hour prior to dental work   Anticoagulation Visit Questionnaire      Coumadin dose missed/changed:  No      Abnormal Bleeding Symptoms:  No   Any diet changes including alcohol intake, vegetables or greens since the last visit:  No Any illnesses or hospitalizations since the last visit:  No Any signs of clotting since the last visit (including chest discomfort, dizziness, shortness of  breath, arm tingling, slurred speech, swelling or redness in leg):  No

## 2010-07-02 NOTE — Cardiovascular Report (Signed)
Summary: Office Visit   Office Visit   Imported By: Roderic Ovens 08/08/2009 11:03:42  _____________________________________________________________________  External Attachment:    Type:   Image     Comment:   External Document

## 2010-07-02 NOTE — Letter (Signed)
Summary: [facility] - Physician's Orders  [facility] - Physician's Orders   Imported By: Marylou Mccoy 06/08/2010 18:03:24  _____________________________________________________________________  External Attachment:    Type:   Image     Comment:   External Document

## 2010-07-02 NOTE — Miscellaneous (Signed)
Summary: [facility]ician's Orders/[facility]  [facility] Orders/[facility]   Imported By: Maryln Gottron 12/17/2009 14:42:05  _____________________________________________________________________  External Attachment:    Type:   Image     Comment:   External Document

## 2010-07-02 NOTE — Miscellaneous (Signed)
Summary: Physician's Orders/[facility]  [facility] Orders/[facility]   Imported By: Maryln Gottron 12/15/2009 13:39:24  _____________________________________________________________________  External Attachment:    Type:   Image     Comment:   External Document

## 2010-07-02 NOTE — Medication Information (Signed)
Summary: Physician's Orders   Physician's Orders   Imported By: Roderic Ovens 03/31/2010 13:21:46  _____________________________________________________________________  External Attachment:    Type:   Image     Comment:   External Document

## 2010-07-02 NOTE — Assessment & Plan Note (Signed)
Summary: f82m   Visit Type:  4 months follow up Primary Provider:  Stacie Glaze MD  CC:  No complaints.  History of Present Illness: 77 year-old woman with severe nonischemic cardiomyopathy and NYHA Class 2 CHF presenting for follow-up evaluation. She remains on Amiodarone for maintenance of sinus rhythm and her lab follow-up has been good.  She has gait unsteadiness, and now wlaks with a walker. She states she gets around well with this. She does regular exercises at her facility. No chest pain, dyspnea, or other complaints at present.    Current Medications (verified): 1)  Coumadin 2.5 Mg  Tabs (Warfarin Sodium) .... As Directed 2.5 Daily Except One Time A Week 5mg . 2)  Multivitamins   Tabs (Multiple Vitamin) .... Take One Tablet By Mouth Daily 3)  Amiodarone Hcl 200 Mg  Tabs (Amiodarone Hcl) .... One Daily 4)  Digoxin 0.125 Mg  Tabs (Digoxin) .... Once Daily 5)  Lasix 40 Mg  Tabs (Furosemide) .... Once Daily 6)  Fish Oil Concentrate 1000 Mg Caps (Omega-3 Fatty Acids) .... Two Caps Two Times A Day 7)  Tylenol Extra Strength 500 Mg Tabs (Acetaminophen) .... As Needed 8)  Saline Nasal Spray 0.65 % Soln (Saline) .... As Needed 9)  Diovan 40 Mg Tabs (Valsartan) .... Take 1 Tablet By Mouth Once A Day 10)  Ensure  Liqd (Nutritional Supplements) .... Drink One Can With Meds Daily 11)  Levothyroxine Sodium 25 Mcg Tabs (Levothyroxine Sodium) .... One By Mouth Daly 12)  Ludens Cough Drops 3-60 Mg Lozg (Menthol-Ascorbic Acid) .... As Needed 13)  Macrodantin 50 Mg Caps (Nitrofurantoin Macrocrystal) .... One By Mouth Daily 14)  Amoxicillin 500 Mg Caps (Amoxicillin) .... 4 1 Hour Prior To Dental Work  Allergies: 1)  Zocor  Past History:  Past medical history reviewed for relevance to current acute and chronic problems.  Past Medical History: Reviewed history from 04/03/2009 and no changes required. Atrial fibrillation Dyslipidemia Myxomatous Mitral Valve dz status post mitral valve  replacement 2006 CARDIOMYOPATHY (ICD-425.4), nonischemic EF 15% Coumadin therapy chronic CHF NYH class 3 CHF S/P ICD (Guidant Vitality ICD model #T175), implanted 01/23/2007  Review of Systems       Negative except as per HPI   Vital Signs:  Patient profile:   77 year old female Height:      62 inches Weight:      106 pounds BMI:     19.46 Pulse rate:   64 / minute Pulse rhythm:   regular Resp:     18 per minute BP sitting:   110 / 58  (left arm) Cuff size:   regular  Vitals Entered By: Vikki Ports (August 04, 2009 10:44 AM)  Physical Exam  General:  Pt is alert and oriented, in no acute distress. HEENT: normal Neck: normal carotid upstrokes without bruits, JVP normal Lungs: CTA CV: RRR without murmur or gallop Abd: soft, NT, positive BS, no bruit, no organomegaly Ext: no clubbing, cyanosis, or edema. peripheral pulses 2+ and equal Skin: warm and dry without rash     ICD Specifications Following MD:  Lewayne Bunting, MD     ICD Vendor:  [facility] [employer]     ICD Model Number:  T175     ICD Serial Number:  [id number] ICD DOI:  01/23/2007     ICD Implanting MD:  Lewayne Bunting, MD  Lead 1:    Location: [facility]     DOI: 01/23/2007     Model #: 8119  Serial #: [id number]     Status: active  Indications::  ICM   ICD Follow Up ICD Dependent:  No      Episodes Coumadin:  Yes  Brady Parameters Mode VVI     Lower Rate Limit:  40      Tachy Zones VF:  240     VT:  200     VT1:  180     Impression & Recommendations:  Problem # 1:  SYSTOLIC HEART FAILURE, CHRONIC (ICD-428.22) Stable, mild symptoms. Continue current therapy, which includes ARB, Dig, Amio, and Coumadin. Off beta-blocker because of hypotension.   Her updated medication list for this problem includes:    Coumadin 2.5 Mg Tabs (Warfarin sodium) .Marland Kitchen... As directed 2.5 daily except one time a week 5mg .    Amiodarone Hcl 200 Mg Tabs (Amiodarone hcl) ..... One daily    Digoxin 0.125 Mg Tabs (Digoxin) ..... Once daily     Lasix 40 Mg Tabs (Furosemide) ..... Once daily    Diovan 40 Mg Tabs (Valsartan) .Marland Kitchen... Take 1 tablet by mouth once a day  Problem # 2:  ATRIAL FIBRILLATION (ICD-427.31)  Proxysmal, maintaining sinus rhythm. Continue current therapy.  Her updated medication list for this problem includes:    Coumadin 2.5 Mg Tabs (Warfarin sodium) .Marland Kitchen... As directed 2.5 daily except one time a week 5mg .    Amiodarone Hcl 200 Mg Tabs (Amiodarone hcl) ..... One daily    Digoxin 0.125 Mg Tabs (Digoxin) ..... Once daily  Her updated medication list for this problem includes:    Coumadin 2.5 Mg Tabs (Warfarin sodium) .Marland Kitchen... As directed 2.5 daily except one time a week 5mg .    Amiodarone Hcl 200 Mg Tabs (Amiodarone hcl) ..... One daily    Digoxin 0.125 Mg Tabs (Digoxin) ..... Once daily  Problem # 3:  SUPRAVENTRICULAR TACHYCARDIA, PAROXYSMAL, HX OF (ICD-V12.59) No recent episodes. Pt stable form CV perspective.  Her updated medication list for this problem includes:    Coumadin 2.5 Mg Tabs (Warfarin sodium) .Marland Kitchen... As directed 2.5 daily except one time a week 5mg .    Amiodarone Hcl 200 Mg Tabs (Amiodarone hcl) ..... One daily  Other Orders: EKG w/ Interpretation (93000)  Patient Instructions: 1)  Your physician recommends that you schedule a follow-up appointment in: 3 MONTHS with Dr Ladona Ridgel 2)  Your physician wants you to follow-up in:  4 MONTHS with Dr Excell Seltzer. You will receive a reminder letter in the mail two months in advance. If you don't receive a letter, please call our office to schedule the follow-up appointment. 3)  Your physician recommends that you continue on your current medications as directed. Please refer to the Current Medication list given to you today.

## 2010-07-02 NOTE — Cardiovascular Report (Signed)
Summary: Office Visit   Office Visit   Imported By: Roderic Ovens 03/09/2010 11:48:16  _____________________________________________________________________  External Attachment:    Type:   Image     Comment:   External Document

## 2010-07-02 NOTE — Procedures (Signed)
Summary: device/saf   Allergies: 1)  Zocor    ICD Specifications Following MD:  Lewayne Bunting, MD     Referring MD:  Excell Seltzer ICD Vendor:  [employer]     ICD Model Number:  T175     ICD Serial Number:  [id number] ICD DOI:  01/23/2007     ICD Implanting MD:  Lewayne Bunting, MD  Lead 1:    Location: RV     DOI: 01/23/2007     Model #: [id number]     Serial #: [id number]     Status: active  Indications::  ICM   ICD Follow Up Remote Check?  No Battery Voltage:  3.15 V     Charge Time:  6.9 seconds     Underlying rhythm:  SR ICD Dependent:  No       ICD Device Measurements Right Ventricle:  Amplitude: 25 mV, Impedance: 773 ohms, Threshold: 1.0 V at 0.4 msec Shock Impedance: 44 ohms   Episodes Coumadin:  Yes Shock:  0     ATP:  0     Nonsustained:  0     Ventricular Pacing:  <1%  Brady Parameters Mode VVI     Lower Rate Limit:  40      Tachy Zones VF:  240     VT:  200     VT1:  180     Next Cardiology Appt Due:  10/29/2009 Tech Comments:  Normal device function.  Pt seen with Dr Excell Seltzer today. No changes made.  ROV 3 months GT. Gypsy Balsam RN BSN  August 04, 2009 11:17 AM  MD Comments:  Agree with above.

## 2010-07-02 NOTE — Assessment & Plan Note (Signed)
Summary: pt/mm  Nurse Visit   Allergies: 1)  Zocor Laboratory Results   Blood Tests   Date/Time Received: July 21, 2009 10:25 AM  Date/Time Reported: July 21, 2009 10:25 AM   PT: 16.4 s   (Normal Range: 10.6-13.4)  INR: 1.8   (Normal Range: 0.88-1.12   Therap INR: 2.0-3.5) Comments: Wynona Canes, CMA  July 21, 2009 10:25 AM     Orders Added: 1)  Est. Patient Level I [99211] 2)  Protime [[id number]]  Laboratory Results   Blood Tests     PT: 16.4 s   (Normal Range: 10.6-13.4)  INR: 1.8   (Normal Range: 0.88-1.12   Therap INR: 2.0-3.5) Comments: Wynona Canes, CMA  July 21, 2009 10:25 AM       ANTICOAGULATION RECORD PREVIOUS REGIMEN & LAB RESULTS Anticoagulation Diagnosis:  Deep venous thrombosis on  01/02/2007 Previous INR Goal Range:  2.0-3.0 on  11/23/2007 Previous INR:  1.5 on  06/18/2009 Previous Coumadin Dose(mg):  2.5mg  x 3days  5mg  on the 4th day on  11/23/2007 Previous Regimen:  Sun. 5mg . all other days 2.5mg  on  06/18/2009 Previous Coagulation Comments:  Approved by Dr. Kirtland Bouchard on  01/01/2009  NEW REGIMEN & LAB RESULTS Current INR: 1.8 Regimen: same dose       Repeat testing in: 4 weeks MEDICATIONS COUMADIN 2.5 MG  TABS (WARFARIN SODIUM) As directed MULTIVITAMINS   TABS (MULTIPLE VITAMIN) Take one tablet by mouth daily AMIODARONE HCL 200 MG  TABS (AMIODARONE HCL) one daily DIGOXIN 0.125 MG  TABS (DIGOXIN) once daily LASIX 40 MG  TABS (FUROSEMIDE) once daily FISH OIL CONCENTRATE 1000 MG CAPS (OMEGA-3 FATTY ACIDS) two caps two times a day TYLENOL EXTRA STRENGTH 500 MG TABS (ACETAMINOPHEN) as needed SALINE NASAL SPRAY 0.65 % SOLN (SALINE) as needed DIOVAN 40 MG TABS (VALSARTAN) Take 1 tablet by mouth once a day ENSURE  LIQD (NUTRITIONAL SUPPLEMENTS) drink one can with meds daily LEVOTHYROXINE SODIUM 25 MCG TABS (LEVOTHYROXINE SODIUM) ONE by mouth DALY LUDENS COUGH DROPS 3-60 MG LOZG (MENTHOL-ASCORBIC ACID) as needed MACRODANTIN 50  MG CAPS (NITROFURANTOIN MACROCRYSTAL) one by mouth daily AMOXICILLIN 500 MG CAPS (AMOXICILLIN) 4 1 hour prior to dental work

## 2010-07-02 NOTE — Procedures (Signed)
Summary: [facility]   Allergies: 1)  Zocor   ICD Specifications Following MD:  Lewayne Bunting, MD     Referring MD:  Excell Seltzer ICD Vendor:  [facility] [employer]     ICD Model Number:  T175     ICD Serial Number:  [id number] ICD DOI:  01/23/2007     ICD Implanting MD:  Lewayne Bunting, MD  Lead 1:    Location: RV     DOI: 01/23/2007     Model #: [id number]     Serial #: [id number]     Status: active  Indications::  ICM   ICD Follow Up Remote Check?  No Battery Voltage:  3.08 V     Charge Time:  7.2 seconds     Battery Est. Longevity:  BOL Underlying rhythm:  SR ICD Dependent:  No       ICD Device Measurements Right Ventricle:  Amplitude: 25 mV, Impedance: 798 ohms, Threshold: 1.0 V at 0.4 msec Shock Impedance: 43 ohms   Episodes Coumadin:  Yes Shock:  0     ATP:  0     Nonsustained:  0     Ventricular Pacing:  0%  Brady Parameters Mode VVI     Lower Rate Limit:  40      Tachy Zones VF:  240     VT:  200     VT1:  180     Tech Comments:   Patient checked during her visit with Dr. Excell Seltzer todayl.  No parameter changes.  Device functiion normal.  ROV 3 months clinic. Altha Harm, LPN  February 16, 2010 12:25 PM   Appended Document: [facility] I talked with daughter who called assisted living and they stated they had stopped amiodorone, but I requested they fax [place] a list of her updated medication regimen so we can make certain.

## 2010-07-02 NOTE — Progress Notes (Signed)
Summary: wgt change  Phone Note From Other Clinic   Caller: Nurse Summary of Call: Yesterday 110 lbs Today 106.4lb [phone] [facility] Initial call taken by: Lynann Beaver CMA,  February 03, 2010 12:50 PM  Follow-up for Phone Call        make sure pt is continuing to intake fluids, continue weights Follow-up by: Stacie Glaze MD,  February 04, 2010 9:05 AM  Additional Follow-up for Phone Call Additional follow up Details #1::        Pt was eating and drinking fine and will continue weights. Additional Follow-up by: Lynann Beaver CMA,  February 04, 2010 9:18 AM

## 2010-07-02 NOTE — Assessment & Plan Note (Signed)
Summary: 3 month fup//ccm   Vital Signs:  Patient profile:   77 year old female Height:      62 inches Weight:      109 pounds BMI:     20.01 Temp:     98.2 degrees F oral Pulse rate:   68 / minute Resp:     14 per minute BP sitting:   106 / 60  (left arm)  Vitals Entered By: Willy Eddy, LPN (December 18, 2009 9:38 AM) CC: roa Is Patient Diabetic? No   Primary Care Provider:  Stacie Glaze MD  CC:  roa.  History of Present Illness: monitering of CHF ( stage 2) AF stable and coumadin management has a bruise on he shin from a fall but no skin tear weight increased due to nutrition improvement no signs of CHF worsening reviewed Dr cooper's notes   Preventive Screening-Counseling & Management  Alcohol-Tobacco     Smoking Status: quit     Year Quit: 1953     Passive Smoke Exposure: no  Current Problems (verified): 1)  Automatic Implantable Cardiac Defibrillator Situ  (ICD-V45.02) 2)  Paroxysmal Ventricular Tachycardia  (ICD-427.1) 3)  Hypothyroidism, Secondary  (ICD-244.8) 4)  Uti's, Recurrent  (ICD-599.0) 5)  Supraventricular Tachycardia, Paroxysmal, Hx of  (ICD-V12.59) 6)  Syncope  (ICD-780.2) 7)  Systolic Heart Failure, Chronic  (ICD-428.22) 8)  Mitral Stenosis/ Insufficiency, Non-rheumatic  (ICD-424.0) 9)  Hordeolum  (ICD-373.11) 10)  Hyperlipidemia, Type II  (ICD-272.4) 11)  Encounter For Long-term Use of Other Medications  (ICD-V58.69) 12)  Unspecified Hypotension  (ICD-458.9) 13)  Coumadin Therapy  (ICD-V58.61) 14)  Encounter For Therapeutic Drug Monitoring  (ICD-V58.83) 15)  Symptom, Memory Loss  (ICD-780.93) 16)  Advef, Drug/medicinal/biological Subst Nos  (ICD-995.20) 17)  Cough, Chronic  (ICD-786.2) 18)  Weight Loss, Abnormal  (ICD-783.21) 19)  Cardiomyopathy  (ICD-425.4) 20)  Atrial Fibrillation  (ICD-427.31)  Current Medications (verified): 1)  Warfarin Sodium 5 Mg Tabs (Warfarin Sodium) .... Use As Directed By Anticoagulation Clinic 2)   Multivitamins   Tabs (Multiple Vitamin) .... Take One Tablet By Mouth Daily 3)  Amiodarone Hcl 200 Mg  Tabs (Amiodarone Hcl) .... One Daily 4)  Digoxin 0.125 Mg  Tabs (Digoxin) .... Once Daily 5)  Lasix 40 Mg  Tabs (Furosemide) .... Once Daily 6)  Fish Oil Concentrate 1000 Mg Caps (Omega-3 Fatty Acids) .... Two Caps Two Times A Day 7)  Tylenol Extra Strength 500 Mg Tabs (Acetaminophen) .... As Needed 8)  Saline Nasal Spray 0.65 % Soln (Saline) .... As Needed 9)  Diovan 40 Mg Tabs (Valsartan) .... Take 1 Tablet By Mouth Once A Day 10)  Ensure  Liqd (Nutritional Supplements) .... Drink One Can With Meds Daily 11)  Levothyroxine Sodium 25 Mcg Tabs (Levothyroxine Sodium) .... One By Mouth Daly 12)  Ludens Cough Drops 3-60 Mg Lozg (Menthol-Ascorbic Acid) .... As Needed 13)  Amoxicillin 500 Mg Caps (Amoxicillin) .... 4 1 Hour Prior To Dental Work  Allergies (verified): 1)  Zocor  Past History:  Family History: Last updated: 01-10-07 father died from  age  at 74 mother died from   ? cause         at 36  Social History: Last updated: 04/16/2008 Alcohol use-no Former Smoker quit in 1953 widowed Drug use-no Regular exercise-yes Lives [place]  Risk Factors: Caffeine Use: 0 (04/19/2007) Exercise: yes (2007/01/10)  Risk Factors: Smoking Status: quit (12/18/2009) Passive Smoke Exposure: no (12/18/2009)  Past medical, surgical, family and social  histories (including risk factors) reviewed, and no changes noted (except as noted below).  Past Medical History: Reviewed history from 04/03/2009 and no changes required. 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 Hemorrhoidectomy Tonsillectomy Mitral valve replacement 2006 ( cow valve) implantable defibrillator  Family History: Reviewed history from 01/02/2007 and no changes required. father died from  age  at 64 mother died from   ? cause         at 59  Social History: Reviewed history from 04/16/2008 and no changes required. Alcohol use-no Former Smoker quit in 1953 widowed Drug use-no Regular exercise-yes Lives [place]  Review of Systems  The patient denies anorexia, fever, weight loss, weight gain, vision loss, decreased hearing, hoarseness, chest pain, syncope, dyspnea on exertion, peripheral edema, prolonged cough, headaches, hemoptysis, abdominal pain, melena, hematochezia, severe indigestion/heartburn, hematuria, incontinence, genital sores, muscle weakness, suspicious skin lesions, transient blindness, difficulty walking, depression, unusual weight change, abnormal bleeding, enlarged lymph nodes, angioedema, and breast masses.    Physical Exam  General:  alert, underweight appearing, and cachetic.   Head:  normocephalic and atraumatic.   Eyes:  pupils equal and pupils round.   Ears:  R ear normal and L ear normal. r ight pinna is erythematous Nose:  no external deformity and no nasal discharge.   Mouth:  pharynx pink and moist and no erythema.   Neck:  No deformities, masses, or tenderness noted. Lungs:  Normal respiratory effort, chest expands symmetrically. Lungs are clear to auscultation, no crackles or wheezes. Heart:  normal rate, regular rhythm, no rub, and no JVD.   Abdomen:  Bowel sounds positive,abdomen soft and non-tender without masses, organomegaly or hernias noted.   Impression & Recommendations:  Problem # 1:  PAROXYSMAL VENTRICULAR TACHYCARDIA (ICD-427.1) monitering her protimes and adjusting Her updated medication list for this problem includes:    Warfarin Sodium 5 Mg Tabs (Warfarin sodium) ..... Use as directed by anticoagulation clinic     Amiodarone Hcl 200 Mg Tabs (Amiodarone hcl) ..... One daily  Labs Reviewed: Na: 137 (06/18/2009)   K+: 3.9 (06/18/2009)   CL: 100 (06/18/2009)   HCO3: 31 (06/18/2009) Ca: 8.9 (06/18/2009)   TSH: 1.99 (09/18/2009)   HCO3: 31 (06/18/2009)  Problem # 2:  HYPOTHYROIDISM, SECONDARY (ICD-244.8)  Her updated medication list for this problem includes:    Levothyroxine Sodium 25 Mcg Tabs (Levothyroxine sodium) ..... One by mouth daly  Labs Reviewed: TSH: 1.99 (09/18/2009)    Chol: 271 (09/18/2009)   HDL: 85.00 (09/18/2009)   LDL: 79 (07/13/2006)   TG: 94 (07/13/2006)  Problem # 3:  SYSTOLIC HEART FAILURE, CHRONIC (ICD-428.22)  Her updated medication list for this problem includes:    Warfarin Sodium 5 Mg Tabs (Warfarin sodium) ..... Use as directed by anticoagulation clinic    Digoxin 0.125 Mg Tabs (Digoxin) ..... Once daily    Lasix 40 Mg Tabs (Furosemide) ..... Once daily    Diovan 40 Mg Tabs (Valsartan) .Marland Kitchen... Take 1 tablet by mouth once a day  Problem # 4:  ADVEF, DRUG/MEDICINAL/BIOLOGICAL SUBST NOS (ICD-995.20) digoxin level was checked and this was .6  Complete Medication List: 1)  Warfarin Sodium 5 Mg Tabs (Warfarin sodium) .... Use as directed by anticoagulation clinic 2)  Multivitamins Tabs (Multiple vitamin) .... Take  one tablet by mouth daily 3)  Amiodarone Hcl 200 Mg Tabs (Amiodarone hcl) .... One daily 4)  Digoxin 0.125 Mg Tabs (Digoxin) .... Once daily 5)  Lasix 40 Mg Tabs (Furosemide) .... Once daily 6)  Fish Oil Concentrate 1000 Mg Caps (Omega-3 fatty acids) .... Two caps two times a day 7)  Tylenol Extra Strength 500 Mg Tabs (Acetaminophen) .... As needed 8)  Saline Nasal Spray 0.65 % Soln (Saline) .... As needed 9)  Diovan 40 Mg Tabs (Valsartan) .... Take 1 tablet by mouth once a day 10)  Ensure Liqd (Nutritional supplements) .... Drink one can with meds daily 11)  Levothyroxine Sodium 25 Mcg Tabs (Levothyroxine sodium) .... One by mouth daly 12)  Ludens Cough Drops  3-60 Mg Lozg (Menthol-ascorbic acid) .... As needed 13)  Amoxicillin 500 Mg Caps (Amoxicillin) .... 4 1 hour prior to dental work  Patient Instructions: 1)  Please schedule a follow-up appointment in 3 months.  Appended Document: Orders Update    Clinical Lists Changes  Orders: Added new Service order of Protime ([id number]) - Signed Added new Service order of Fingerstick [phone]) - Signed      Appended Document: Orders Update    Clinical Lists Changes  Observations: Added new observation of ABNORM BLEED: No (12/18/2009 10:38) Added new observation of COUMADIN CHG: No (12/18/2009 10:38) Added new observation of NEXT PT: 4 weeks (12/18/2009 10:38) Added new observation of COMMENTS2: Wynona Canes, CMA  December 18, 2009 10:38 AM  (12/18/2009 10:38) Added new observation of INR: 2.1  (12/18/2009 10:38)      Laboratory Results   Blood Tests   Date/Time Recieved: December 18, 2009 10:38 AM  Date/Time Reported: December 18, 2009 10:38 AM    INR: 2.1   (Normal Range: 0.88-1.12   Therap INR: 2.0-3.5) Comments: Wynona Canes, CMA  December 18, 2009 10:38 AM       ANTICOAGULATION RECORD PREVIOUS REGIMEN & LAB RESULTS Anticoagulation Diagnosis:  Deep venous thrombosis on  01/02/2007 Previous INR Goal Range:  2.0-3.0 on  11/23/2007 Previous INR:  2.4 on  11/18/2009 Previous Coumadin Dose(mg):  5mg  only on sundays then 2.5mg  qd on  08/22/2009 Previous Regimen:  same dose on  09/18/2009 Previous Coagulation Comments:  Approved by Dr. Kirtland Bouchard on  01/01/2009  NEW REGIMEN & LAB RESULTS Current INR: 2.1 Regimen: same dose  (no change)       Repeat testing in: 4 weeks MEDICATIONS WARFARIN SODIUM 5 MG TABS (WARFARIN SODIUM) Use as directed by Anticoagulation Clinic MULTIVITAMINS   TABS (MULTIPLE VITAMIN) Take one tablet by mouth daily AMIODARONE HCL 200 MG  TABS (AMIODARONE HCL) one daily DIGOXIN 0.125 MG  TABS (DIGOXIN) once daily LASIX 40 MG  TABS (FUROSEMIDE) once daily FISH OIL  CONCENTRATE 1000 MG CAPS (OMEGA-3 FATTY ACIDS) two caps two times a day TYLENOL EXTRA STRENGTH 500 MG TABS (ACETAMINOPHEN) as needed SALINE NASAL SPRAY 0.65 % SOLN (SALINE) as needed DIOVAN 40 MG TABS (VALSARTAN) Take 1 tablet by mouth once a day ENSURE  LIQD (NUTRITIONAL SUPPLEMENTS) drink one can with meds daily LEVOTHYROXINE SODIUM 25 MCG TABS (LEVOTHYROXINE SODIUM) ONE by mouth DALY LUDENS COUGH DROPS 3-60 MG LOZG (MENTHOL-ASCORBIC ACID) as needed AMOXICILLIN 500 MG CAPS (AMOXICILLIN) 4 1 hour prior to dental work   Anticoagulation Visit Questionnaire      Coumadin dose missed/changed:  No      Abnormal Bleeding Symptoms:  No   Any diet changes including alcohol intake, vegetables or greens since the last  visit:  No Any illnesses or hospitalizations since the last visit:  No Any signs of clotting since the last visit (including chest discomfort, dizziness, shortness of breath, arm tingling, slurred speech, swelling or redness in leg):  No

## 2010-07-02 NOTE — Assessment & Plan Note (Signed)
Summary: pt/cjr/pt rescd//ccm  Nurse Visit   Allergies: 1)  Zocor Laboratory Results   Blood Tests   Date/Time Received: Oct 16, 2009 10:54 AM  Date/Time Reported: Oct 16, 2009 10:54 AM   PT: 17.1 s   (Normal Range: 10.6-13.4)  INR: 1.9   (Normal Range: 0.88-1.12   Therap INR: 2.0-3.5) Comments: Wynona Canes, CMA  Oct 16, 2009 10:55 AM     Orders Added: 1)  Est. Patient Level I [99211] 2)  Protime [[id number]]  Laboratory Results   Blood Tests     PT: 17.1 s   (Normal Range: 10.6-13.4)  INR: 1.9   (Normal Range: 0.88-1.12   Therap INR: 2.0-3.5) Comments: Wynona Canes, CMA  Oct 16, 2009 10:55 AM       ANTICOAGULATION RECORD PREVIOUS REGIMEN & LAB RESULTS Anticoagulation Diagnosis:  Deep venous thrombosis on  01/02/2007 Previous INR Goal Range:  2.0-3.0 on  11/23/2007 Previous INR:  2.4 on  09/18/2009 Previous Coumadin Dose(mg):  5mg  only on sundays then 2.5mg  qd on  08/22/2009 Previous Regimen:  same dose on  09/18/2009 Previous Coagulation Comments:  Approved by Dr. Kirtland Bouchard on  01/01/2009  NEW REGIMEN & LAB RESULTS Current INR: 1.9 Regimen: same dose  (no change)       Repeat testing in: 4 weeks MEDICATIONS COUMADIN 2.5 MG  TABS (WARFARIN SODIUM) As directed 2.5 daily except one time a week 5mg . MULTIVITAMINS   TABS (MULTIPLE VITAMIN) Take one tablet by mouth daily AMIODARONE HCL 200 MG  TABS (AMIODARONE HCL) one daily DIGOXIN 0.125 MG  TABS (DIGOXIN) once daily LASIX 40 MG  TABS (FUROSEMIDE) once daily FISH OIL CONCENTRATE 1000 MG CAPS (OMEGA-3 FATTY ACIDS) two caps two times a day TYLENOL EXTRA STRENGTH 500 MG TABS (ACETAMINOPHEN) as needed SALINE NASAL SPRAY 0.65 % SOLN (SALINE) as needed DIOVAN 40 MG TABS (VALSARTAN) Take 1 tablet by mouth once a day ENSURE  LIQD (NUTRITIONAL SUPPLEMENTS) drink one can with meds daily LEVOTHYROXINE SODIUM 25 MCG TABS (LEVOTHYROXINE SODIUM) ONE by mouth DALY LUDENS COUGH DROPS 3-60 MG LOZG (MENTHOL-ASCORBIC ACID) as  needed AMOXICILLIN 500 MG CAPS (AMOXICILLIN) 4 1 hour prior to dental work   Anticoagulation Visit Questionnaire      Coumadin dose missed/changed:  No      Abnormal Bleeding Symptoms:  No   Any diet changes including alcohol intake, vegetables or greens since the last visit:  No Any illnesses or hospitalizations since the last visit:  No Any signs of clotting since the last visit (including chest discomfort, dizziness, shortness of breath, arm tingling, slurred speech, swelling or redness in leg):  No

## 2010-07-02 NOTE — Miscellaneous (Signed)
Summary: Physician's Orders/[facility]  Physician's Orders/[facility]   Imported By: Maryln Gottron 08/20/2009 15:02:37  _____________________________________________________________________  External Attachment:    Type:   Image     Comment:   External Document

## 2010-07-02 NOTE — Assessment & Plan Note (Signed)
Summary: PT/NJR  Nurse Visit   Allergies: 1)  Zocor Laboratory Results   Blood Tests   Date/Time Received: January 16, 2010 3:01 PM   Date/Time Reported: January 16, 2010 3:00 PM    INR: 2.2   (Normal Range: 0.88-1.12   Therap INR: 2.0-3.5) Comments: Wynona Canes, CMA  January 16, 2010 3:01 PM     Orders Added: 1)  Est. Patient Level I [99211] 2)  Protime [[id number]]  Laboratory Results   Blood Tests      INR: 2.2   (Normal Range: 0.88-1.12   Therap INR: 2.0-3.5) Comments: Wynona Canes, CMA  January 16, 2010 3:01 PM       ANTICOAGULATION RECORD PREVIOUS REGIMEN & LAB RESULTS Anticoagulation Diagnosis:  Deep venous thrombosis on  01/02/2007 Previous INR Goal Range:  2.0-3.0 on  11/23/2007 Previous INR:  2.1 on  12/18/2009 Previous Coumadin Dose(mg):  5mg  only on sundays then 2.5mg  qd on  08/22/2009 Previous Regimen:  same dose on  09/18/2009 Previous Coagulation Comments:  Approved by Dr. Kirtland Bouchard on  01/01/2009  NEW REGIMEN & LAB RESULTS Current INR: 2.2 Regimen: same dose  (no change)       Repeat testing in: 4 weeks MEDICATIONS WARFARIN SODIUM 5 MG TABS (WARFARIN SODIUM) Use as directed by Anticoagulation Clinic MULTIVITAMINS   TABS (MULTIPLE VITAMIN) Take one tablet by mouth daily AMIODARONE HCL 200 MG  TABS (AMIODARONE HCL) one daily DIGOXIN 0.125 MG  TABS (DIGOXIN) once daily LASIX 40 MG  TABS (FUROSEMIDE) once daily FISH OIL CONCENTRATE 1000 MG CAPS (OMEGA-3 FATTY ACIDS) two caps two times a day TYLENOL EXTRA STRENGTH 500 MG TABS (ACETAMINOPHEN) as needed SALINE NASAL SPRAY 0.65 % SOLN (SALINE) as needed DIOVAN 40 MG TABS (VALSARTAN) Take 1 tablet by mouth once a day ENSURE  LIQD (NUTRITIONAL SUPPLEMENTS) drink one can with meds daily LEVOTHYROXINE SODIUM 25 MCG TABS (LEVOTHYROXINE SODIUM) ONE by mouth DALY LUDENS COUGH DROPS 3-60 MG LOZG (MENTHOL-ASCORBIC ACID) as needed AMOXICILLIN 500 MG CAPS (AMOXICILLIN) 4 1 hour prior to dental  work   Anticoagulation Visit Questionnaire      Coumadin dose missed/changed:  No      Abnormal Bleeding Symptoms:  No   Any diet changes including alcohol intake, vegetables or greens since the last visit:  No Any illnesses or hospitalizations since the last visit:  No Any signs of clotting since the last visit (including chest discomfort, dizziness, shortness of breath, arm tingling, slurred speech, swelling or redness in leg):  No

## 2010-07-02 NOTE — Miscellaneous (Signed)
Summary: Physician Communication- Orders/[facility]  Physician Communication- Orders/[facility]   Imported By: Maryln Gottron 11/12/2009 09:44:47  _____________________________________________________________________  External Attachment:    Type:   Image     Comment:   External Document

## 2010-07-02 NOTE — Progress Notes (Signed)
Summary: Checking paper work  Phone Note Other Incoming   Caller: [employer] [phone] Ext [phone] Jola Babinski Summary of Call: Checking on paper work Initial call taken by: Judie Grieve,  April 01, 2010 1:14 PM  Follow-up for Phone Call        Left message for Jola Babinski that paperwork is currently being completed by [employer]. Julieta Gutting, RN, BSN  April 01, 2010 1:27 PM

## 2010-07-02 NOTE — Assessment & Plan Note (Signed)
Summary: 3 MONTH ROV/NJR/PT RSC FROM BMP/CJR   Vital Signs:  Patient profile:   77 year old female Height:      62 inches Weight:      111 pounds Temp:     98.1 degrees F BP sitting:   120 / 66  Vitals Entered By: Willy Eddy, LPN (March 16, 2010 2:24 PM)  Allergies: 1)  Zocor   Impression & Recommendations:  Problem # 1:  TRANSAMINASES, SERUM, ELEVATED (ICD-790.4) Assessment New  repeat suspect cholesterol  Orders: TLB-Hepatic/Liver Function Pnl (80076-HEPATIC) Venipuncture (47829)  Problem # 2:  PAROXYSMAL VENTRICULAR TACHYCARDIA (ICD-427.1)  Her updated medication list for this problem includes:    Warfarin Sodium 5 Mg Tabs (Warfarin sodium) ..... Use as directed by anticoagulation clinic    Amiodarone Hcl 200 Mg Tabs (Amiodarone hcl) ..... One daily  Labs Reviewed: Na: 138 (02/16/2010)   K+: 4.3 (02/16/2010)   CL: 97 (02/16/2010)   HCO3: 30 (02/16/2010) Ca: 9.1 (02/16/2010)   TSH: 2.30 (02/16/2010)   HCO3: 30 (02/16/2010)  Problem # 3:  HYPOTHYROIDISM, SECONDARY (ICD-244.8)  Her updated medication list for this problem includes:    Levothyroxine Sodium 25 Mcg Tabs (Levothyroxine sodium) ..... One by mouth daly  Labs Reviewed: TSH: 2.30 (02/16/2010)    Chol: 271 (09/18/2009)   HDL: 85.00 (09/18/2009)   LDL: 79 (07/13/2006)   TG: 94 (07/13/2006)  Complete Medication List: 1)  Warfarin Sodium 5 Mg Tabs (Warfarin sodium) .... Use as directed by anticoagulation clinic 2)  Amiodarone Hcl 200 Mg Tabs (Amiodarone hcl) .... One daily 3)  Digoxin 0.125 Mg Tabs (Digoxin) .Marland Kitchen.. 1 tab by mouth every other day 4)  Lasix 40 Mg Tabs (Furosemide) .... Once daily 5)  Fish Oil Concentrate 1000 Mg Caps (Omega-3 fatty acids) .... Two caps two times a day 6)  Tylenol Extra Strength 500 Mg Tabs (Acetaminophen) .... As needed 7)  Saline Nasal Spray 0.65 % Soln (Saline) .... As needed 8)  Diovan 40 Mg Tabs (Valsartan) .... Take 1 tablet by mouth once a day 9)  Ensure Liqd  (Nutritional supplements) .... Drink one can with meds daily 10)  Levothyroxine Sodium 25 Mcg Tabs (Levothyroxine sodium) .... One by mouth daly 11)  Amoxicillin 500 Mg Caps (Amoxicillin) .... 4 1 hour prior to dental work 12)  Nitrofurantoin Macrocrystal 50 Mg Caps (Nitrofurantoin macrocrystal) .Marland Kitchen.. 1  tab by mouth once daily 13)  Therems Tabs (Multiple vitamin) .Marland Kitchen.. 1 tab by mouth once daily  Patient Instructions: 1)  Please schedule a follow-up appointment in 3 months.   Orders Added: 1)  Est. Patient Level IV [56213] 2)  TLB-Hepatic/Liver Function Pnl [80076-HEPATIC] 3)  Venipuncture [[id number]]  Appended Document: Orders Update    Clinical Lists Changes  Orders: Added new Service order of Protime ([id number]) - Signed Added new Service order of Specimen Handling ([id number]) - Signed Observations: Added new observation of ABNORM BLEED: No (03/16/2010 15:11) Added new observation of COUMADIN CHG: No (03/16/2010 15:11) Added new observation of NEXT PT: 4 weeks (03/16/2010 15:11) Added new observation of COMMENTS2: Wynona Canes, CMA  March 16, 2010 3:13 PM  (03/16/2010 15:11) Added new observation of INR: 1.9  (03/16/2010 15:11)      Laboratory Results   Blood Tests   Date/Time Recieved: March 16, 2010 3:13 PM  Date/Time Reported: March 16, 2010 3:13 PM    INR: 1.9   (Normal Range: 0.88-1.12   Therap INR: 2.0-3.5) Comments: Wynona Canes, CMA  March 16, 2010  3:13 PM       ANTICOAGULATION RECORD PREVIOUS REGIMEN & LAB RESULTS Anticoagulation Diagnosis:  Deep venous thrombosis on  01/02/2007 Previous INR Goal Range:  2.0-3.0 on  11/23/2007 Previous INR:  1.7 ratio on  02/16/2010 Previous Coumadin Dose(mg):  5mg  only on sundays then 2.5mg  qd on  08/22/2009 Previous Regimen:  same dose on  09/18/2009 Previous Coagulation Comments:  Approved by Dr. Kirtland Bouchard on  01/01/2009  NEW REGIMEN & LAB RESULTS Current INR: 1.9 Regimen: same dose  (no change)       Repeat  testing in: 4 weeks MEDICATIONS WARFARIN SODIUM 5 MG TABS (WARFARIN SODIUM) Use as directed by Anticoagulation Clinic AMIODARONE HCL 200 MG  TABS (AMIODARONE HCL) one daily DIGOXIN 0.125 MG  TABS (DIGOXIN) 1 tab by mouth every other day LASIX 40 MG  TABS (FUROSEMIDE) once daily FISH OIL CONCENTRATE 1000 MG CAPS (OMEGA-3 FATTY ACIDS) two caps two times a day TYLENOL EXTRA STRENGTH 500 MG TABS (ACETAMINOPHEN) as needed SALINE NASAL SPRAY 0.65 % SOLN (SALINE) as needed DIOVAN 40 MG TABS (VALSARTAN) Take 1 tablet by mouth once a day ENSURE  LIQD (NUTRITIONAL SUPPLEMENTS) drink one can with meds daily LEVOTHYROXINE SODIUM 25 MCG TABS (LEVOTHYROXINE SODIUM) ONE by mouth DALY AMOXICILLIN 500 MG CAPS (AMOXICILLIN) 4 1 hour prior to dental work NITROFURANTOIN MACROCRYSTAL 50 MG CAPS (NITROFURANTOIN MACROCRYSTAL) 1  tab by mouth once daily THEREMS  TABS (MULTIPLE VITAMIN) 1 tab by mouth once daily   Anticoagulation Visit Questionnaire      Coumadin dose missed/changed:  No      Abnormal Bleeding Symptoms:  No   Any diet changes including alcohol intake, vegetables or greens since the last visit:  No Any illnesses or hospitalizations since the last visit:  No Any signs of clotting since the last visit (including chest discomfort, dizziness, shortness of breath, arm tingling, slurred speech, swelling or redness in leg):  No

## 2010-07-02 NOTE — Assessment & Plan Note (Signed)
Summary: pt//ccm  Nurse Visit   Allergies: 1)  Zocor Laboratory Results   Blood Tests      INR: 1.6   (Normal Range: 0.88-1.12   Therap INR: 2.0-3.5) Comments: Rita Ohara  May 15, 2010 2:24 PM     Orders Added: 1)  Est. Patient Level I [99211] 2)  Protime [[id number]]   ANTICOAGULATION RECORD PREVIOUS REGIMEN & LAB RESULTS Anticoagulation Diagnosis:  Deep venous thrombosis on  01/02/2007 Previous INR Goal Range:  2.0-3.0 on  11/23/2007 Previous INR:  1.6 on  04/17/2010 Previous Coumadin Dose(mg):  5mg  only on sundays then 2.5mg  qd on  08/22/2009 Previous Regimen:  same dose on  09/18/2009 Previous Coagulation Comments:  Approved by Dr. Kirtland Bouchard on  01/01/2009  NEW REGIMEN & LAB RESULTS Current INR: 1.6 Regimen: 5mg . today then 5mg . wed. and Sun. all others 2.5mg  Coagulation Comments: Dr. Kirtland Bouchard approved Repeat testing in: 2 weeks  Anticoagulation Visit Questionnaire Coumadin dose missed/changed:  No Abnormal Bleeding Symptoms:  No  Any diet changes including alcohol intake, vegetables or greens since the last visit:  No Any illnesses or hospitalizations since the last visit:  No Any signs of clotting since the last visit (including chest discomfort, dizziness, shortness of breath, arm tingling, slurred speech, swelling or redness in leg):  No  MEDICATIONS WARFARIN SODIUM 5 MG TABS (WARFARIN SODIUM) Use as directed by Anticoagulation Clinic DIGOXIN 0.125 MG  TABS (DIGOXIN) 1 tab by mouth every other day LASIX 40 MG  TABS (FUROSEMIDE) once daily FISH OIL CONCENTRATE 1000 MG CAPS (OMEGA-3 FATTY ACIDS) two caps two times a day TYLENOL EXTRA STRENGTH 500 MG TABS (ACETAMINOPHEN) as needed SALINE NASAL SPRAY 0.65 % SOLN (SALINE) as needed DIOVAN 40 MG TABS (VALSARTAN) Take 1 tablet by mouth once a day ENSURE  LIQD (NUTRITIONAL SUPPLEMENTS) drink one can with meds daily LEVOTHYROXINE SODIUM 25 MCG TABS (LEVOTHYROXINE SODIUM) ONE by mouth DALY AMOXICILLIN 500 MG CAPS  (AMOXICILLIN) 4 1 hour prior to dental work NITROFURANTOIN MACROCRYSTAL 50 MG CAPS (NITROFURANTOIN MACROCRYSTAL) 1  tab by mouth once daily THEREMS  TABS (MULTIPLE VITAMIN) 1 tab by mouth once daily

## 2010-07-02 NOTE — Miscellaneous (Signed)
Summary: Physician's Orders/[facility]  Physician's Orders/[facility]   Imported By: Maryln Gottron 12/24/2009 12:46:44  _____________________________________________________________________  External Attachment:    Type:   Image     Comment:   External Document

## 2010-07-02 NOTE — Assessment & Plan Note (Signed)
Summary: 2 MTH ROV // RS   Vital Signs:  Patient profile:   77 year old female Height:      62 inches Weight:      103 pounds BMI:     18.91 Temp:     98.2 degrees F oral Pulse rate:   64 / minute Resp:     14 per minute BP sitting:   100 / 60  (left arm)  Vitals Entered By: Willy Eddy, LPN (June 18, 2009 10:24 AM) CC: roa, CHF Management   Primary Care Provider:  Stacie Glaze MD  CC:  roa and CHF Management.  History of Present Illness: spirts are better and she is much more alert no cough or congestion discussion of exposure MONITERING OF DRUGS  CHF History:      She complains of dyspnea with exertion, but denies headache, chest pain, palpitations, orthopnea, PND, visual symptoms, neurologic problems, syncope, and side effects from treatment.     Preventive Screening-Counseling & Management  Alcohol-Tobacco     Smoking Status: quit     Year Quit: 1953     Passive Smoke Exposure: no  Problems Prior to Update: 1)  Automatic Implantable Cardiac Defibrillator Situ  (ICD-V45.02) 2)  Paroxysmal Ventricular Tachycardia  (ICD-427.1) 3)  Hypothyroidism, Secondary  (ICD-244.8) 4)  Uti's, Recurrent  (ICD-599.0) 5)  Supraventricular Tachycardia, Paroxysmal, Hx of  (ICD-V12.59) 6)  Syncope  (ICD-780.2) 7)  Systolic Heart Failure, Chronic  (ICD-428.22) 8)  Mitral Stenosis/ Insufficiency, Non-rheumatic  (ICD-424.0) 9)  Hordeolum  (ICD-373.11) 10)  Hyperlipidemia, Type II  (ICD-272.4) 11)  Encounter For Long-term Use of Other Medications  (ICD-V58.69) 12)  Unspecified Hypotension  (ICD-458.9) 13)  Coumadin Therapy  (ICD-V58.61) 14)  Encounter For Therapeutic Drug Monitoring  (ICD-V58.83) 15)  Symptom, Memory Loss  (ICD-780.93) 16)  Advef, Drug/medicinal/biological Subst Nos  (ICD-995.20) 17)  Cough, Chronic  (ICD-786.2) 18)  Weight Loss, Abnormal  (ICD-783.21) 19)  Cardiomyopathy  (ICD-425.4) 20)  Atrial Fibrillation  (ICD-427.31)  Medications Prior to  Update: 1)  Coumadin 2.5 Mg  Tabs (Warfarin Sodium) .... As Directed 2)  Multivitamins   Tabs (Multiple Vitamin) .... Take One Tablet By Mouth Daily 3)  Amiodarone Hcl 200 Mg  Tabs (Amiodarone Hcl) .... One Daily 4)  Digoxin 0.125 Mg  Tabs (Digoxin) .... Once Daily 5)  Lasix 40 Mg  Tabs (Furosemide) .... Once Daily 6)  Fish Oil Concentrate 1000 Mg Caps (Omega-3 Fatty Acids) .... Two Caps Two Times A Day 7)  Tylenol Extra Strength 500 Mg Tabs (Acetaminophen) .... As Needed 8)  Saline Nasal Spray 0.65 % Soln (Saline) .... As Needed 9)  Diovan 40 Mg Tabs (Valsartan) .... Take 1 Tablet By Mouth Once A Day 10)  Ensure  Liqd (Nutritional Supplements) .... Drink One Can With Meds Daily 11)  Levothyroxine Sodium 25 Mcg Tabs (Levothyroxine Sodium) .... One By Mouth Daly 12)  Ludens Cough Drops 3-60 Mg Lozg (Menthol-Ascorbic Acid) .... As Needed 13)  Macrodantin 50 Mg Caps (Nitrofurantoin Macrocrystal) .... One By Mouth Daily  Current Medications (verified): 1)  Coumadin 2.5 Mg  Tabs (Warfarin Sodium) .... As Directed 2)  Multivitamins   Tabs (Multiple Vitamin) .... Take One Tablet By Mouth Daily 3)  Amiodarone Hcl 200 Mg  Tabs (Amiodarone Hcl) .... One Daily 4)  Digoxin 0.125 Mg  Tabs (Digoxin) .... Once Daily 5)  Lasix 40 Mg  Tabs (Furosemide) .... Once Daily 6)  Fish Oil Concentrate 1000 Mg Caps (Omega-3 Fatty Acids) .Marland KitchenMarland KitchenMarland Kitchen  Two Caps Two Times A Day 7)  Tylenol Extra Strength 500 Mg Tabs (Acetaminophen) .... As Needed 8)  Saline Nasal Spray 0.65 % Soln (Saline) .... As Needed 9)  Diovan 40 Mg Tabs (Valsartan) .... Take 1 Tablet By Mouth Once A Day 10)  Ensure  Liqd (Nutritional Supplements) .... Drink One Can With Meds Daily 11)  Levothyroxine Sodium 25 Mcg Tabs (Levothyroxine Sodium) .... One By Mouth Daly 12)  Ludens Cough Drops 3-60 Mg Lozg (Menthol-Ascorbic Acid) .... As Needed 13)  Macrodantin 50 Mg Caps (Nitrofurantoin Macrocrystal) .... One By Mouth Daily 14)  Amoxicillin 500 Mg Caps  (Amoxicillin) .... 4 1 Hour Prior To Dental Work  Allergies (verified): 1)  Zocor  Past History:  Family History: Last updated: Jan 29, 2007 father died from  age  at 67 mother died from   ? cause         at 31  Social History: Last updated: 04/16/2008 Alcohol use-no Former Smoker quit in 1953 widowed Drug use-no Regular exercise-yes Lives [facility]  Risk Factors: Caffeine Use: 0 (04/19/2007) Exercise: yes (29-Jan-2007)  Risk Factors: Smoking Status: quit (06/18/2009) Passive Smoke Exposure: no (06/18/2009)  Past medical, surgical, family and social histories (including risk factors) reviewed, and no changes noted (except as noted below).  Past Medical History: Reviewed history from 04/03/2009 and no changes required. 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 Hemorrhoidectomy Tonsillectomy Mitral valve replacement 2006 ( cow valve) implantable defibrillator  Family History: Reviewed history from 01-29-07 and no changes required. father died from  age  at 62 mother died from   ? cause         at 44  Social History: Reviewed history from 04/16/2008 and no changes required. Alcohol use-no Former Smoker quit in 1953 widowed Drug use-no Regular exercise-yes Lives [facility]  Review of Systems  The patient denies anorexia, fever, weight loss, weight gain, vision loss, decreased hearing, hoarseness, chest pain, syncope, dyspnea on exertion, peripheral edema, prolonged cough, headaches, hemoptysis, abdominal pain, melena, hematochezia, severe indigestion/heartburn, hematuria, incontinence, genital sores, muscle weakness, suspicious skin lesions, transient  blindness, difficulty walking, depression, unusual weight change, abnormal bleeding, enlarged lymph nodes, angioedema, and breast masses.    Physical Exam  General:  alert, underweight appearing, and cachetic.   Head:  normocephalic and atraumatic.   Eyes:  pupils equal and pupils round.   Ears:  R ear normal and L ear normal. r ight pinna is erythematous Nose:  no external deformity and no nasal discharge.   Mouth:  pharynx pink and moist and no erythema.   Neck:  No deformities, masses, or tenderness noted. Lungs:  Normal respiratory effort, chest expands symmetrically. Lungs are clear to auscultation, no crackles or wheezes. Heart:  normal rate, regular rhythm, no rub, and no JVD.   Abdomen:  Bowel sounds positive,abdomen soft and non-tender without masses, organomegaly or hernias noted. Msk:  normal ROM and no joint tenderness.   Extremities:  trace left pedal edema and trace right pedal edema.   Neurologic:  finger-to-nose normal.     Impression & Recommendations:  Problem # 1:  SYSTOLIC HEART FAILURE, CHRONIC (ICD-428.22)  stable Her updated medication list for this problem includes:    Coumadin 2.5 Mg Tabs (Warfarin sodium) .Marland Kitchen... As directed  Digoxin 0.125 Mg Tabs (Digoxin) ..... Once daily    Lasix 40 Mg Tabs (Furosemide) ..... Once daily    Diovan 40 Mg Tabs (Valsartan) .Marland Kitchen... Take 1 tablet by mouth once a day  Orders: TLB-Digoxin (Lanoxin) ([id number])  Problem # 2:  HYPERLIPIDEMIA, TYPE II (ICD-272.4) discussion Labs Reviewed: SGOT: 38 (01/01/2009)   SGPT: 48 (01/01/2009)   HDL:74.10 (08/21/2008), 44.9 (07/13/2006)  LDL:79 (07/13/2006)  Chol:236 (08/21/2008), 143 (07/13/2006)  Trig:94 (07/13/2006)  Problem # 3:  UTI'S, RECURRENT (ICD-599.0) Assessment: Unchanged  stable Her updated medication list for this problem includes:    Macrodantin 50 Mg Caps (Nitrofurantoin macrocrystal) ..... One by mouth daily    Amoxicillin 500 Mg Caps (Amoxicillin) .Marland KitchenMarland KitchenMarland KitchenMarland Kitchen 4 1 hour  prior to dental work  Encouraged to push clear liquids, get enough rest, and take acetaminophen as needed. To be seen in 10 days if no improvement, sooner if worse.  Orders: TLB-BMP (Basic Metabolic Panel-BMET) (80048-METABOL)  Problem # 4:  SUPRAVENTRICULAR TACHYCARDIA, PAROXYSMAL, HX OF (ICD-V12.59) Assessment: Unchanged hx of svt and [periodic blood pressure drops  THESE EPISODES AHVE NO CLEAR ETIOLOG AS WHEN SHE REACHED THE ER THE BLOOD PRESSURE AHD STABILITES SO THEREFORE WE HAVE GIVEN INSTRUCTONS TO THE NURSING HOME FOR CARE IHN THESE CASES CONSISTANT WITH HER DESIRES  Complete Medication List: 1)  Coumadin 2.5 Mg Tabs (Warfarin sodium) .... As directed 2)  Multivitamins Tabs (Multiple vitamin) .... Take one tablet by mouth daily 3)  Amiodarone Hcl 200 Mg Tabs (Amiodarone hcl) .... One daily 4)  Digoxin 0.125 Mg Tabs (Digoxin) .... Once daily 5)  Lasix 40 Mg Tabs (Furosemide) .... Once daily 6)  Fish Oil Concentrate 1000 Mg Caps (Omega-3 fatty acids) .... Two caps two times a day 7)  Tylenol Extra Strength 500 Mg Tabs (Acetaminophen) .... As needed 8)  Saline Nasal Spray 0.65 % Soln (Saline) .... As needed 9)  Diovan 40 Mg Tabs (Valsartan) .... Take 1 tablet by mouth once a day 10)  Ensure Liqd (Nutritional supplements) .... Drink one can with meds daily 11)  Levothyroxine Sodium 25 Mcg Tabs (Levothyroxine sodium) .... One by mouth daly 12)  Ludens Cough Drops 3-60 Mg Lozg (Menthol-ascorbic acid) .... As needed 13)  Macrodantin 50 Mg Caps (Nitrofurantoin macrocrystal) .... One by mouth daily 14)  Amoxicillin 500 Mg Caps (Amoxicillin) .... 4 1 hour prior to dental work  Other Orders: TLB-TSH (Thyroid Stimulating Hormone) (84443-TSH) TLB-T4 (Thyrox), Free [phone]) TLB-T3, Free (Triiodothyronine) (84481-T3FREE) TLB-CBC Platelet - w/Differential (85025-CBCD) TLB-PT (Protime) (85610-PTP) Fingerstick (19147)  CHF Assessment/Plan:      The patient's current weight is 103  pounds.  Her previous weight was 106 pounds.  The patient's preferred weight ('dry weight') is 106 pounds.  The patient is not on an ACE-I due to Patient is NOW on ACE/arb.    Patient Instructions: 1)  When HYPOTENSION IS DETECTED LESS THAT SYSTOLLIC OF 90 2)  GIVEN ORAL HYDRATON WITH GATORAID, PLACE AT BED REST 3)  MONITER FOR 2 HOURS AND ONLY TRANSPORT IF THE BLOOD PRESSURE DOES NOT RESPOND 4)  PT HAS A NO CODE BLUE DOCUMENT! 5)  Please schedule a follow-up appointment in 3 months.  Appended Document: 2 MTH ROV // RS   ANTICOAGULATION RECORD PREVIOUS REGIMEN & LAB RESULTS Anticoagulation Diagnosis:  Deep venous thrombosis on  01/02/2007 Previous INR Goal Range:  2.0-3.0 on  11/23/2007 Previous INR:  4.0 on  04/18/2009 Previous Coumadin Dose(mg):  2.5mg  x 3days  5mg  on the 4th day on  11/23/2007  Previous Regimen:  same on  05/19/2009 Previous Coagulation Comments:  Approved by Dr. Kirtland Bouchard on  01/01/2009  NEW REGIMEN & LAB RESULTS Current INR: 1.5 Regimen: Sun. 5mg . all other days 2.5mg   Repeat testing in: 1 month  Anticoagulation Visit Questionnaire Coumadin dose missed/changed:  No Abnormal Bleeding Symptoms:  No  Any diet changes including alcohol intake, vegetables or greens since the last visit:  No Any illnesses or hospitalizations since the last visit:  No Any signs of clotting since the last visit (including chest discomfort, dizziness, shortness of breath, arm tingling, slurred speech, swelling or redness in leg):  No  MEDICATIONS COUMADIN 2.5 MG  TABS (WARFARIN SODIUM) As directed MULTIVITAMINS   TABS (MULTIPLE VITAMIN) Take one tablet by mouth daily AMIODARONE HCL 200 MG  TABS (AMIODARONE HCL) one daily DIGOXIN 0.125 MG  TABS (DIGOXIN) once daily LASIX 40 MG  TABS (FUROSEMIDE) once daily FISH OIL CONCENTRATE 1000 MG CAPS (OMEGA-3 FATTY ACIDS) two caps two times a day TYLENOL EXTRA STRENGTH 500 MG TABS (ACETAMINOPHEN) as needed SALINE NASAL SPRAY 0.65 % SOLN (SALINE) as  needed DIOVAN 40 MG TABS (VALSARTAN) Take 1 tablet by mouth once a day ENSURE  LIQD (NUTRITIONAL SUPPLEMENTS) drink one can with meds daily LEVOTHYROXINE SODIUM 25 MCG TABS (LEVOTHYROXINE SODIUM) ONE by mouth DALY LUDENS COUGH DROPS 3-60 MG LOZG (MENTHOL-ASCORBIC ACID) as needed MACRODANTIN 50 MG CAPS (NITROFURANTOIN MACROCRYSTAL) one by mouth daily AMOXICILLIN 500 MG CAPS (AMOXICILLIN) 4 1 hour prior to dental work    Laboratory Results   Blood Tests     PT: 15.0 s   (Normal Range: 10.6-13.4)  INR: 1.5   (Normal Range: 0.88-1.12   Therap INR: 2.0-3.5) Comments: Rita Ohara  June 18, 2009 11:45 AM

## 2010-07-02 NOTE — Miscellaneous (Signed)
Summary: Physician Communication/[facility]  Physician Communication/[facility]   Imported By: Maryln Gottron 06/04/2009 12:46:55  _____________________________________________________________________  External Attachment:    Type:   Image     Comment:   External Document

## 2010-07-02 NOTE — Miscellaneous (Signed)
Summary: Order/[facility]  Order/[facility]   Imported By: Sherian Rein 02/04/2010 08:17:30  _____________________________________________________________________  External Attachment:    Type:   Image     Comment:   External Document

## 2010-07-02 NOTE — Progress Notes (Signed)
Summary: calling about labs  Phone Note Call from Patient Call back at [phone]   Caller: Daughter/Jeanna Graves Summary of Call: Pt daughter calling her mother labs that due to be drawn on 04/01/10 pt has already had labs drawn by pcp on 03/16/10 Dr.Jenkins Initial call taken by: Judie Grieve,  March 18, 2010 2:46 PM  Follow-up for Phone Call        I spoke with the pt's daughter and labs were checked by Dr Lovell Sheehan.  She would like the Liver panel ordered by Dr Excell Seltzer on 04/03/10 cancelled.  Order will be faxed. Per Eileen Stanford she will have Dr Lovell Sheehan continue to follow-up on liver function.  Follow-up by: Julieta Gutting, RN, BSN,  March 18, 2010 3:27 PM

## 2010-07-02 NOTE — Miscellaneous (Signed)
Summary: Treatment Plan, Physician's Orders/[facility]  Treatment Plan, Physician's Orders/[facility]   Imported By: Maryln Gottron 03/10/2010 11:15:47  _____________________________________________________________________  External Attachment:    Type:   Image     Comment:   External Document

## 2010-07-02 NOTE — Miscellaneous (Signed)
Summary: Individual Service Plan/[facility]  Individual Service Plan/[facility]   Imported By: Maryln Gottron 01/20/2010 09:19:55  _____________________________________________________________________  External Attachment:    Type:   Image     Comment:   External Document

## 2010-07-02 NOTE — Assessment & Plan Note (Signed)
Summary: device/sqf   Visit Type:  Follow-up Primary Provider:  Stacie Glaze MD   History of Present Illness: 77 year-old woman with severe nonischemic cardiomyopathy and NYHA Class 2 CHF presenting for follow-up evaluation. She remains on Amiodarone for maintenance of sinus rhythm and her lab follow-up has been good. She has gait unsteadiness, and now walks with a walker. She states she gets around well with this. She does regular exercises at her facility. No chest pain, dyspnea, or other complaints at present.  She has not had any intercurrent ICD therapies.    Current Medications (verified): 1)  Warfarin Sodium 5 Mg Tabs (Warfarin Sodium) .... Use As Directed By Anticoagulation Clinic 2)  Multivitamins   Tabs (Multiple Vitamin) .... Take One Tablet By Mouth Daily 3)  Amiodarone Hcl 200 Mg  Tabs (Amiodarone Hcl) .... One Daily 4)  Digoxin 0.125 Mg  Tabs (Digoxin) .... Once Daily 5)  Lasix 40 Mg  Tabs (Furosemide) .... Once Daily 6)  Fish Oil Concentrate 1000 Mg Caps (Omega-3 Fatty Acids) .... Two Caps Two Times A Day 7)  Tylenol Extra Strength 500 Mg Tabs (Acetaminophen) .... As Needed 8)  Saline Nasal Spray 0.65 % Soln (Saline) .... As Needed 9)  Diovan 40 Mg Tabs (Valsartan) .... Take 1 Tablet By Mouth Once A Day 10)  Ensure  Liqd (Nutritional Supplements) .... Drink One Can With Meds Daily 11)  Levothyroxine Sodium 25 Mcg Tabs (Levothyroxine Sodium) .... One By Mouth Daly 12)  Ludens Cough Drops 3-60 Mg Lozg (Menthol-Ascorbic Acid) .... As Needed 13)  Amoxicillin 500 Mg Caps (Amoxicillin) .... 4 1 Hour Prior To Dental Work  Allergies (verified): 1)  Zocor  Past History:  Past Medical History: Last updated: 04/03/2009 Atrial fibrillation Dyslipidemia Myxomatous Mitral Valve dz status post mitral valve replacement 2006 CARDIOMYOPATHY (ICD-425.4), nonischemic EF 15% Coumadin therapy chronic CHF NYH class 3 CHF S/P ICD (Guidant Vitality ICD model #T175), implanted  01/23/2007  Past Surgical History: Last updated: 03/24/2007 Hemorrhoidectomy Tonsillectomy Mitral valve replacement 2006 ( cow valve) implantable defibrillator  Review of Systems  The patient denies chest pain, syncope, dyspnea on exertion, and peripheral edema.    Vital Signs:  Patient profile:   77 year old female Height:      62 inches Weight:      106 pounds Pulse rate:   62 / minute BP sitting:   100 / 60  (left arm)  Vitals Entered By: Laurance Flatten CMA (November 25, 2009 2:36 PM)  Physical Exam  General:  alert, underweight appearing, and cachetic.   Head:  normocephalic and atraumatic.   Eyes:  pupils equal and pupils round.   Mouth:  pharynx pink and moist and no erythema.   Neck:  No deformities, masses, or tenderness noted. Chest Wall:  Well healed ICD incision. Lungs:  Normal respiratory effort, chest expands symmetrically. Lungs are clear to auscultation, no crackles or wheezes. Heart:  normal rate, regular rhythm, no rub, and no JVD.   Abdomen:  Bowel sounds positive,abdomen soft and non-tender without masses, organomegaly or hernias noted. Msk:  normal ROM and no joint tenderness.   Pulses:  pulses normal in all 4 extremities Extremities:  trace left pedal edema and trace right pedal edema.   Neurologic:  alert & oriented X3 and finger-to-nose normal.      ICD Specifications Following MD:  Lewayne Bunting, MD     Referring MD:  Excell Seltzer ICD Vendor:  [facility] [employer]     ICD Model Number:  T175     ICD Serial Number:  [id number] ICD DOI:  01/23/2007     ICD Implanting MD:  Lewayne Bunting, MD  Lead 1:    Location: RV     DOI: 01/23/2007     Model #: [id number]     Serial #: [id number]     Status: active  Indications::  ICM   ICD Follow Up Remote Check?  No Battery Voltage:  3.12 V     Charge Time:  6.9 seconds     ICD Dependent:  No       ICD Device Measurements Right Ventricle:  Amplitude: 25 mV, Impedance: 773 ohms, Threshold: 0.8 V at 0.4 msec Shock Impedance: 44 ohms    Episodes Coumadin:  Yes Shock:  0     ATP:  0     Nonsustained:  0     Ventricular Pacing:  0%  Brady Parameters Mode VVI     Lower Rate Limit:  40      Tachy Zones VF:  240     VT:  200     VT1:  180     Next Cardiology Appt Due:  01/29/2010 Tech Comments:  Normal device function.  No chnages made today.  ROV 3 months clinic. Gypsy Balsam RN BSN  November 25, 2009 2:43 PM  MD Comments:  Agree with above.  Impression & Recommendations:  Problem # 1:  AUTOMATIC IMPLANTABLE CARDIAC DEFIBRILLATOR SITU (ICD-V45.02) Her device is working normally.  Will recheck in several months.  Problem # 2:  PAROXYSMAL VENTRICULAR TACHYCARDIA (ICD-427.1) She has had no additional ICD therapies. Continue meds as below. Her updated medication list for this problem includes:    Warfarin Sodium 5 Mg Tabs (Warfarin sodium) ..... Use as directed by anticoagulation clinic    Amiodarone Hcl 200 Mg Tabs (Amiodarone hcl) ..... One daily  Problem # 3:  SYNCOPE (ICD-780.2) No recurrent spells since her last visit. Her updated medication list for this problem includes:    Warfarin Sodium 5 Mg Tabs (Warfarin sodium) ..... Use as directed by anticoagulation clinic    Amiodarone Hcl 200 Mg Tabs (Amiodarone hcl) ..... One daily  Problem # 4:  SYSTOLIC HEART FAILURE, CHRONIC (ICD-428.22) Her symptoms appear to be class 2.  Continue current meds. Her updated medication list for this problem includes:    Warfarin Sodium 5 Mg Tabs (Warfarin sodium) ..... Use as directed by anticoagulation clinic    Amiodarone Hcl 200 Mg Tabs (Amiodarone hcl) ..... One daily    Digoxin 0.125 Mg Tabs (Digoxin) ..... Once daily    Lasix 40 Mg Tabs (Furosemide) ..... Once daily    Diovan 40 Mg Tabs (Valsartan) .Marland Kitchen... Take 1 tablet by mouth once a day  Patient Instructions: 1)  Your physician recommends that you schedule a follow-up appointment in: 12 months with Dr Ronaldo Miyamoto

## 2010-07-02 NOTE — Progress Notes (Signed)
----   Converted from flag ---- ---- 03/23/2010 4:48 PM, Stacie Glaze MD wrote: thanks will follow up... bonnye can you check on this  ---- 03/23/2010 9:13 AM, Julieta Gutting, RN, BSN wrote: Dr Excell Seltzer sent faxed order 02/20/10 to discontinue amiodarone ------------------------------  talked with daughter, who called assisted living and they stated they had stopped amiodorone, but i requested they fax [place] a list  of h er updated medication regimen, so we can be sure.

## 2010-07-02 NOTE — Miscellaneous (Signed)
Summary: [facility]ician[facility] Orders/[facility]  [facility] Orders/[facility]   Imported By: Maryln Gottron 09/08/2009 10:40:37  _____________________________________________________________________  External Attachment:    Type:   Image     Comment:   External Document

## 2010-07-02 NOTE — Assessment & Plan Note (Signed)
Summary: ROV   Visit Type:  Follow-up Primary Provider:  Stacie Glaze MD  CC:  check up.  History of Present Illness: 77 year-old woman with severe nonischemic cardiomyopathy following bioprosthetic mitral valve replacement and paroxysmal AF presenting for follow-up evaluation. She remains on Amiodarone for maintenance of sinus rhythm and her lab follow-up has been good. She reports no ICD therapies.  She denies chest pain, dyspnea, edema, orthopnea, or PND. She has some problems with gait unsteadiness but reports no recent falls.    Current Medications (verified): 1)  Warfarin Sodium 5 Mg Tabs (Warfarin Sodium) .... Use As Directed By Anticoagulation Clinic 2)  Amiodarone Hcl 200 Mg  Tabs (Amiodarone Hcl) .... One Daily 3)  Digoxin 0.125 Mg  Tabs (Digoxin) .Marland Kitchen.. 1 Tab By Mouth Every Other Day 4)  Lasix 40 Mg  Tabs (Furosemide) .... Once Daily 5)  Fish Oil Concentrate 1000 Mg Caps (Omega-3 Fatty Acids) .... Two Caps Two Times A Day 6)  Tylenol Extra Strength 500 Mg Tabs (Acetaminophen) .... As Needed 7)  Saline Nasal Spray 0.65 % Soln (Saline) .... As Needed 8)  Diovan 40 Mg Tabs (Valsartan) .... Take 1 Tablet By Mouth Once A Day 9)  Ensure  Liqd (Nutritional Supplements) .... Drink One Can With Meds Daily 10)  Levothyroxine Sodium 25 Mcg Tabs (Levothyroxine Sodium) .... One By Mouth Daly 11)  Amoxicillin 500 Mg Caps (Amoxicillin) .... 4 1 Hour Prior To Dental Work 12)  Nitrofurantoin Macrocrystal 50 Mg Caps (Nitrofurantoin Macrocrystal) .Marland Kitchen.. 1  Tab By Mouth Once Daily 13)  Therems  Tabs (Multiple Vitamin) .Marland Kitchen.. 1 Tab By Mouth Once Daily  Allergies: 1)  Zocor  Past History:  Past medical history reviewed for relevance to current acute and chronic problems.  Past Medical History: Reviewed history from 04/03/2009 and no changes required. Atrial fibrillation Dyslipidemia Myxomatous Mitral Valve dz status post mitral valve replacement 2006 CARDIOMYOPATHY (ICD-425.4),  nonischemic EF 15% Coumadin therapy chronic CHF NYH class 3 CHF S/P ICD (Guidant Vitality ICD model #T175), implanted 01/23/2007  Review of Systems       Negative except as per HPI   Vital Signs:  Patient profile:   77 year old female Height:      62 inches Weight:      111 pounds BMI:     20.38 Pulse rate:   60 / minute Resp:     14 per minute BP sitting:   135 / 60  (left arm)  Vitals Entered By: Kem Parkinson (February 16, 2010 12:14 PM)  Serial Vital Signs/Assessments:  Time      Position  BP       Pulse  Resp  Temp     By           R Arm     135/60                         Kimalexis Barnes           L Arm     121/58                         Kimalexis Barnes   Physical Exam  General:  Pt is alert and oriented, elderly woman in no acute distress. HEENT: normal Neck: normal carotid upstrokes without bruits, JVP normal Lungs: CTA CV: RRR without murmur or gallop Abd: soft, NT, positive BS, no bruit, no organomegaly Ext: no clubbing,  cyanosis, or edema.  Skin: warm and dry without rash    EKG  Procedure date:  02/16/2010  Findings:      HR 60 bpm, abnormal p wave axis, prolonged QT with QTc 480 ms   ICD Specifications Following MD:  Lewayne Bunting, MD     Referring MD:  Excell Seltzer ICD Vendor:  [employer]     ICD Model Number:  T175     ICD Serial Number:  [id number] ICD DOI:  01/23/2007     ICD Implanting MD:  Lewayne Bunting, MD  Lead 1:    Location: RV     DOI: 01/23/2007     Model #: 0157     Serial #: [id number]     Status: active  Indications::  ICM   ICD Follow Up ICD Dependent:  No      Episodes Coumadin:  Yes  Brady Parameters Mode VVI     Lower Rate Limit:  40      Tachy Zones VF:  240     VT:  200     VT1:  180     Impression & Recommendations:  Problem # 1:  SYSTOLIC HEART FAILURE, CHRONIC (ICD-428.22) Pt's volume status is stable. Continue current Rx.  Her updated medication list for this problem includes:    Warfarin Sodium 5 Mg Tabs  (Warfarin sodium) ..... Use as directed by anticoagulation clinic    Amiodarone Hcl 200 Mg Tabs (Amiodarone hcl) ..... One daily    Digoxin 0.125 Mg Tabs (Digoxin) .Marland Kitchen... 1 tab by mouth every other day    Lasix 40 Mg Tabs (Furosemide) ..... Once daily    Diovan 40 Mg Tabs (Valsartan) .Marland Kitchen... Take 1 tablet by mouth once a day  Orders: TLB-BMP (Basic Metabolic Panel-BMET) (80048-METABOL) TLB-Hepatic/Liver Function Pnl (80076-HEPATIC) TLB-TSH (Thyroid Stimulating Hormone) (84443-TSH) T-2 View CXR (71020TC)  Problem # 2:  MITRAL STENOSIS/ INSUFFICIENCY, NON-RHEUMATIC (ICD-424.0) Exam remains stable without evidence of MR.  Her updated medication list for this problem includes:    Digoxin 0.125 Mg Tabs (Digoxin) .Marland Kitchen... 1 tab by mouth every other day    Lasix 40 Mg Tabs (Furosemide) ..... Once daily    Diovan 40 Mg Tabs (Valsartan) .Marland Kitchen... Take 1 tablet by mouth once a day  Problem # 3:  ATRIAL FIBRILLATION (ICD-427.31) Pt maintaining sinus EKG today suggests possibility of ectopic atrial rhythm. Continue warfarin. Needs labs and CXR for amiodarone monitoring - will order.  Her updated medication list for this problem includes:    Warfarin Sodium 5 Mg Tabs (Warfarin sodium) ..... Use as directed by anticoagulation clinic    Amiodarone Hcl 200 Mg Tabs (Amiodarone hcl) ..... One daily    Digoxin 0.125 Mg Tabs (Digoxin) .Marland Kitchen... 1 tab by mouth every other day  Orders: TLB-BMP (Basic Metabolic Panel-BMET) (80048-METABOL) TLB-Hepatic/Liver Function Pnl (80076-HEPATIC) TLB-TSH (Thyroid Stimulating Hormone) (84443-TSH) TLB-PT (Protime) (85610-PTP) T-2 View CXR (71020TC)  Patient Instructions: 1)  Your physician wants you to follow-up in: 4 MONTHS.  You will receive a reminder letter in the mail two months in advance. If you don't receive a letter, please call our office to schedule the follow-up appointment. 2)  Device Nurses in 3 MONTHS.  3)  Chest X-ray obtained in the office today. 4)  Your  physician recommends that you have lab work today: BMP, LIVER, TSH 5)  Your physician recommends that you continue on your current medications as directed. Please refer to the Current Medication list given to you today.

## 2010-07-02 NOTE — Miscellaneous (Signed)
Summary: Physician's Orders/[facility]  [facility] Orders/[facility]   Imported By: Maryln Gottron 08/27/2009 13:44:43  _____________________________________________________________________  External Attachment:    Type:   Image     Comment:   External Document

## 2010-07-02 NOTE — Assessment & Plan Note (Signed)
Summary: protime//ccm/pts daughter rsc/cjr  Nurse Visit   Allergies: 1)  Zocor Laboratory Results   Blood Tests   Date/Time Received: August 22, 2009 11:38 AM  Date/Time Reported: August 22, 2009 11:38 AM   PT: 16.5 s   (Normal Range: 10.6-13.4)  INR: 1.8   (Normal Range: 0.88-1.12   Therap INR: 2.0-3.5) Comments: Wynona Canes, CMA  August 22, 2009 11:38 AM     Orders Added: 1)  Est. Patient Level I [99211] 2)  Protime [62130QM]  Laboratory Results   Blood Tests     PT: 16.5 s   (Normal Range: 10.6-13.4)  INR: 1.8   (Normal Range: 0.88-1.12   Therap INR: 2.0-3.5) Comments: Wynona Canes, CMA  August 22, 2009 11:38 AM       ANTICOAGULATION RECORD PREVIOUS REGIMEN & LAB RESULTS Anticoagulation Diagnosis:  Deep venous thrombosis on  01/02/2007 Previous INR Goal Range:  2.0-3.0 on  11/23/2007 Previous INR:  1.8 on  07/21/2009 Previous Coumadin Dose(mg):  2.5mg  x 3days  5mg  on the 4th day on  11/23/2007 Previous Regimen:  same dose on  07/21/2009 Previous Coagulation Comments:  Approved by Dr. Kirtland Bouchard on  01/01/2009  NEW REGIMEN & LAB RESULTS Current INR: 1.8 Current Coumadin Dose(mg): 5mg  only on sundays then 2.5mg  qd Regimen: 5mg  only on sundays then 2.5mg  qd       Repeat testing in: 4 weeks MEDICATIONS COUMADIN 2.5 MG  TABS (WARFARIN SODIUM) As directed 2.5 daily except one time a week 5mg . MULTIVITAMINS   TABS (MULTIPLE VITAMIN) Take one tablet by mouth daily AMIODARONE HCL 200 MG  TABS (AMIODARONE HCL) one daily DIGOXIN 0.125 MG  TABS (DIGOXIN) once daily LASIX 40 MG  TABS (FUROSEMIDE) once daily FISH OIL CONCENTRATE 1000 MG CAPS (OMEGA-3 FATTY ACIDS) two caps two times a day TYLENOL EXTRA STRENGTH 500 MG TABS (ACETAMINOPHEN) as needed SALINE NASAL SPRAY 0.65 % SOLN (SALINE) as needed DIOVAN 40 MG TABS (VALSARTAN) Take 1 tablet by mouth once a day ENSURE  LIQD (NUTRITIONAL SUPPLEMENTS) drink one can with meds daily LEVOTHYROXINE SODIUM 25 MCG TABS  (LEVOTHYROXINE SODIUM) ONE by mouth DALY LUDENS COUGH DROPS 3-60 MG LOZG (MENTHOL-ASCORBIC ACID) as needed MACRODANTIN 50 MG CAPS (NITROFURANTOIN MACROCRYSTAL) one by mouth daily AMOXICILLIN 500 MG CAPS (AMOXICILLIN) 4 1 hour prior to dental work   Anticoagulation Visit Questionnaire      Coumadin dose missed/changed:  No      Abnormal Bleeding Symptoms:  No   Any diet changes including alcohol intake, vegetables or greens since the last visit:  No Any illnesses or hospitalizations since the last visit:  No Any signs of clotting since the last visit (including chest discomfort, dizziness, shortness of breath, arm tingling, slurred speech, swelling or redness in leg):  No

## 2010-07-02 NOTE — Miscellaneous (Signed)
Summary: Physician[facility] Orders/[facility]  [facility] Orders/[facility]   Imported By: Maryln Gottron 05/26/2010 10:23:20  _____________________________________________________________________  External Attachment:    Type:   Image     Comment:   External Document

## 2010-07-02 NOTE — Progress Notes (Signed)
----   Converted from flag ---- ---- 03/25/2010 1:36 PM, Julieta Gutting, RN, BSN wrote: Thank you!!  ---- 03/25/2010 12:20 PM, Willy Eddy, LPN wrote: FYI--Received medication list on this pt and her amiodorone was dicontinued on 02-20-10 ------------------------------

## 2010-07-02 NOTE — Medication Information (Signed)
Summary: Physician's Orders   Physician's Orders   Imported By: Roderic Ovens 03/31/2010 13:22:56  _____________________________________________________________________  External Attachment:    Type:   Image     Comment:   External Document

## 2010-07-02 NOTE — Procedures (Signed)
Summary: device check/sl   Current Medications (verified): 1)  Warfarin Sodium 5 Mg Tabs (Warfarin Sodium) .... Use As Directed By Anticoagulation Clinic 2)  Digoxin 0.125 Mg  Tabs (Digoxin) .Marland Kitchen.. 1 Tab By Mouth Every Other Day 3)  Lasix 40 Mg  Tabs (Furosemide) .... Once Daily 4)  Fish Oil Concentrate 1000 Mg Caps (Omega-3 Fatty Acids) .... Two Caps Two Times A Day 5)  Tylenol Extra Strength 500 Mg Tabs (Acetaminophen) .... As Needed 6)  Saline Nasal Spray 0.65 % Soln (Saline) .... As Needed 7)  Diovan 40 Mg Tabs (Valsartan) .... Take 1 Tablet By Mouth Once A Day 8)  Ensure  Liqd (Nutritional Supplements) .... Drink One Can With Meds Daily 9)  Levothyroxine Sodium 25 Mcg Tabs (Levothyroxine Sodium) .... One By Mouth Daly 10)  Amoxicillin 500 Mg Caps (Amoxicillin) .... 4 1 Hour Prior To Dental Work 11)  Nitrofurantoin Macrocrystal 50 Mg Caps (Nitrofurantoin Macrocrystal) .Marland Kitchen.. 1  Tab By Mouth Once Daily 12)  Therems  Tabs (Multiple Vitamin) .Marland Kitchen.. 1 Tab By Mouth Once Daily  Allergies (verified): 1)  Zocor   ICD Specifications Following MD:  Lewayne Bunting, MD     Referring MD:  Excell Seltzer ICD Vendor:  [employer]     ICD Model Number:  T175     ICD Serial Number:  [id number] ICD DOI:  01/23/2007     ICD Implanting MD:  Lewayne Bunting, MD  Lead 1:    Location: [facility]     DOI: 01/23/2007     Model #: 0157     Serial #: [id number]     Status: active  Indications::  ICM   ICD Follow Up Battery Voltage:  3.02 V     Charge Time:  7.4 seconds     Underlying rhythm:  SR ICD Dependent:  No       ICD Device Measurements Right Ventricle:  Amplitude: 25.0 mV, Impedance: 785 ohms, Threshold: 1.2 V at 0.4 msec Shock Impedance: 41 ohms   Episodes MS Episodes:  0     Coumadin:  Yes Shock:  0     ATP:  0     Nonsustained:  0     Atrial Therapies:  0 Ventricular Pacing:  0%  Brady Parameters Mode VVI     Lower Rate Limit:  40      Tachy Zones VF:  240     VT:  200     VT1:  180     Next Cardiology Appt  Due:  08/13/2010 Tech Comments:  NORMAL DEVICE FUNCTION.  NO EPISODES SINCE LAST CHECK.  CHANGED RV OUTPUT FROM 2.6 TO 2.4 V.  ROV IN 3 MTHS W/DEVICE CLINIC. Mary Arellano  May 14, 2010 10:06 AM

## 2010-07-02 NOTE — Letter (Signed)
Summary: [facility] Orders  [facility] Orders   Imported By: Roderic Ovens 09/11/2009 15:30:22  _____________________________________________________________________  External Attachment:    Type:   Image     Comment:   External Document

## 2010-07-02 NOTE — Assessment & Plan Note (Signed)
Summary: 3 MO ROV/MM   Vital Signs:  Patient profile:   77 year old female Height:      62 inches Weight:      116 pounds BMI:     21.29 Temp:     98.2 degrees F oral Pulse rate:   68 / minute Resp:     14 per minute BP sitting:   110 / 70  (left arm)  Vitals Entered By: Willy Eddy, LPN (June 17, 2010 9:48 AM) CC: roa- dr concerned about dementia Is Patient Diabetic? No   Primary Care Provider:  Stacie Glaze MD  CC:  roa- dr concerned about dementia.  History of Present Illness: The pt present for floow up with recent episodes of inappropriate memory She is able to demonstract good recall and reasoning as well as passed the clock test with ease today. We discussed mental exercizes to keep the brain clear as well as physical exercizes to keep fall risk down No evidence of CHF heart is in SR today coumadin levels checked today very stable weigth and activity levels has pacer/defibrilator check planned  Preventive Screening-Counseling & Management  Alcohol-Tobacco     Smoking Status: quit     Year Quit: 1953     Passive Smoke Exposure: no  Current Problems (verified): 1)  Transaminases, Serum, Elevated  (ICD-790.4) 2)  Automatic Implantable Cardiac Defibrillator Situ  (ICD-V45.02) 3)  Paroxysmal Ventricular Tachycardia  (ICD-427.1) 4)  Hypothyroidism, Secondary  (ICD-244.8) 5)  Uti's, Recurrent  (ICD-599.0) 6)  Supraventricular Tachycardia, Paroxysmal, Hx of  (ICD-V12.59) 7)  Syncope  (ICD-780.2) 8)  Systolic Heart Failure, Chronic  (ICD-428.22) 9)  Mitral Stenosis/ Insufficiency, Non-rheumatic  (ICD-424.0) 10)  Hordeolum  (ICD-373.11) 11)  Hyperlipidemia, Type II  (ICD-272.4) 12)  Encounter For Long-term Use of Other Medications  (ICD-V58.69) 13)  Unspecified Hypotension  (ICD-458.9) 14)  Coumadin Therapy  (ICD-V58.61) 15)  Encounter For Therapeutic Drug Monitoring  (ICD-V58.83) 16)  Symptom, Memory Loss  (ICD-780.93) 17)  Advef,  Drug/medicinal/biological Subst Nos  (ICD-995.20) 18)  Cough, Chronic  (ICD-786.2) 19)  Weight Loss, Abnormal  (ICD-783.21) 20)  Cardiomyopathy  (ICD-425.4) 21)  Atrial Fibrillation  (ICD-427.31)  Current Medications (verified): 1)  Warfarin Sodium 5 Mg Tabs (Warfarin Sodium) .... Use As Directed By Anticoagulation Clinic 2)  Digoxin 0.125 Mg  Tabs (Digoxin) .Marland Kitchen.. 1 Tab By Mouth Every Other Day 3)  Lasix 40 Mg  Tabs (Furosemide) .... Once Daily 4)  Fish Oil Concentrate 1000 Mg Caps (Omega-3 Fatty Acids) .... Two Caps Two Times A Day 5)  Tylenol Extra Strength 500 Mg Tabs (Acetaminophen) .... As Needed 6)  Saline Nasal Spray 0.65 % Soln (Saline) .... As Needed 7)  Diovan 40 Mg Tabs (Valsartan) .... Take 1 Tablet By Mouth Once A Day 8)  Ensure  Liqd (Nutritional Supplements) .... Drink One Can With Meds Daily 9)  Levothyroxine Sodium 25 Mcg Tabs (Levothyroxine Sodium) .... One By Mouth Daly 10)  Amoxicillin 500 Mg Caps (Amoxicillin) .... 4 1 Hour Prior To Dental Work 11)  Nitrofurantoin Macrocrystal 50 Mg Caps (Nitrofurantoin Macrocrystal) .Marland Kitchen.. 1  Tab By Mouth Once Daily 12)  Therems  Tabs (Multiple Vitamin) .Marland Kitchen.. 1 Tab By Mouth Once Daily  Allergies (verified): 1)  Zocor  Past History:  Family History: Last updated: Jan 09, 2007 father died from  age  at 40 mother died from   ? cause         at 59  Social History: Last updated: 04/16/2008 Alcohol  use-no Former Smoker quit in 1953 widowed Drug use-no Regular exercise-yes Lives [facility]  Risk Factors: Caffeine Use: 0 (04/19/2007) Exercise: yes (01/02/2007)  Risk Factors: Smoking Status: quit (06/17/2010) Passive Smoke Exposure: no (06/17/2010)  Past medical, surgical, family and social histories (including risk factors) reviewed, and no changes noted (except as noted below).  Past Medical History: Reviewed history from 04/03/2009 and no changes required. Atrial fibrillation Dyslipidemia Myxomatous Mitral Valve dz  status post mitral valve replacement 2006 CARDIOMYOPATHY (ICD-425.4), nonischemic EF 15% Coumadin therapy chronic CHF NYH class 3 CHF S/P ICD (Guidant Vitality ICD model #T175), implanted 01/23/2007  Past Surgical History: Reviewed history from 03/24/2007 and no changes required. Hemorrhoidectomy Tonsillectomy Mitral valve replacement 2006 ( cow valve) implantable defibrillator  Family History: Reviewed history from 01/02/2007 and no changes required. father died from  age  at 8 mother died from   ? cause         at 60  Social History: Reviewed history from 04/16/2008 and no changes required. Alcohol use-no Former Smoker quit in 1953 widowed Drug use-no Regular exercise-yes Lives [facility]  Review of Systems  The patient denies anorexia, fever, weight loss, weight gain, vision loss, decreased hearing, hoarseness, chest pain, syncope, dyspnea on exertion, peripheral edema, prolonged cough, headaches, hemoptysis, abdominal pain, melena, hematochezia, severe indigestion/heartburn, hematuria, incontinence, genital sores, muscle weakness, suspicious skin lesions, transient blindness, difficulty walking, depression, unusual weight change, abnormal bleeding, enlarged lymph nodes, angioedema, and breast masses.    Physical Exam  General:  alert, underweight appearing, and cachetic.   Head:  normocephalic and atraumatic.   Eyes:  pupils equal and pupils round.   Ears:  R ear normal and L ear normal. r ight pinna is erythematous Nose:  no external deformity and no nasal discharge.   Mouth:  pharynx pink and moist and no erythema.   Neck:  No deformities, masses, or tenderness noted. Lungs:  Normal respiratory effort, chest expands symmetrically. Lungs are clear to auscultation, no crackles or wheezes. Heart:  normal rate, regular rhythm, and Grade  1 /6 systolic ejection murmur.   Abdomen:  Bowel sounds positive,abdomen soft and non-tender without masses, organomegaly or  hernias noted.   Impression & Recommendations:  Problem # 1:  SYSTOLIC HEART FAILURE, CHRONIC (ICD-428.22) Assessment Improved  Her updated medication list for this problem includes:    Warfarin Sodium 5 Mg Tabs (Warfarin sodium) ..... Use as directed by anticoagulation clinic    Digoxin 0.125 Mg Tabs (Digoxin) .Marland Kitchen... 1 tab by mouth every other day    Lasix 40 Mg Tabs (Furosemide) ..... Once daily    Diovan 40 Mg Tabs (Valsartan) .Marland Kitchen... Take 1 tablet by mouth once a day  Orders: TLB-BNP (B-Natriuretic Peptide) (83880-BNPR)  Problem # 2:  MITRAL STENOSIS/ INSUFFICIENCY, NON-RHEUMATIC (ICD-424.0) Assessment: Unchanged  Her updated medication list for this problem includes:    Warfarin Sodium 5 Mg Tabs (Warfarin sodium) ..... Use as directed by anticoagulation clinic  Problem # 3:  SUPRAVENTRICULAR TACHYCARDIA, PAROXYSMAL, HX OF (ICD-V12.59)  Problem # 4:  HYPERLIPIDEMIA, TYPE II (ICD-272.4)  Labs Reviewed: SGOT: 82 (03/16/2010)   SGPT: 110 (03/16/2010)   HDL:85.00 (09/18/2009), 74.10 (08/21/2008)  LDL:79 (07/13/2006)  Chol:271 (09/18/2009), 236 (08/21/2008)  Trig:94 (07/13/2006)  Orders: TLB-Lipid Panel (80061-LIPID)  Complete Medication List: 1)  Warfarin Sodium 5 Mg Tabs (Warfarin sodium) .... Use as directed by anticoagulation clinic 2)  Digoxin 0.125 Mg Tabs (Digoxin) .Marland Kitchen.. 1 tab by mouth every other day 3)  Lasix 40 Mg Tabs (  Furosemide) .... Once daily 4)  Omega-3 Krill Oil 300 Mg Caps (Krill oil) .... Two by mouth two times a day 5)  Tylenol Extra Strength 500 Mg Tabs (Acetaminophen) .... As needed 6)  Saline Nasal Spray 0.65 % Soln (Saline) .... As needed 7)  Diovan 40 Mg Tabs (Valsartan) .... Take 1 tablet by mouth once a day 8)  Ensure Liqd (Nutritional supplements) .... Drink one can with meds daily 9)  Levothyroxine Sodium 25 Mcg Tabs (Levothyroxine sodium) .... One by mouth daly 10)  Amoxicillin 500 Mg Caps (Amoxicillin) .... 4 1 hour prior to dental work 11)   Nitrofurantoin Macrocrystal 50 Mg Caps (Nitrofurantoin macrocrystal) .Marland Kitchen.. 1  tab by mouth once daily 12)  Therems Tabs (Multiple vitamin) .Marland Kitchen.. 1 tab by mouth once daily  Other Orders: Fingerstick (16109) Protime (60454UJ) TLB-Hepatic/Liver Function Pnl (80076-HEPATIC) Venipuncture (81191) TLB-CBC Platelet - w/Differential (85025-CBCD)  Patient Instructions: 1)  keep on current coumadin dose 2)  Please schedule a follow-up appointment in 3 months. Prescriptions: OMEGA-3 KRILL OIL 300 MG CAPS (KRILL OIL) two by mouth two times a day  #120 x 11   Entered and Authorized by:   Stacie Glaze MD   Signed by:   Stacie Glaze MD on 06/17/2010   Method used:   Print then Give to Patient   RxID:   [id number]    Orders Added: 1)  Fingerstick [36416] 2)  Protime [46962XB] 3)  TLB-Hepatic/Liver Function Pnl [80076-HEPATIC] 4)  Venipuncture [28413] 5)  TLB-BNP (B-Natriuretic Peptide) [83880-BNPR] 6)  TLB-Lipid Panel [80061-LIPID] 7)  TLB-CBC Platelet - w/Differential [85025-CBCD] 8)  Est. Patient Level IV [24401]    Laboratory Results   Blood Tests   Date/Time Recieved: June 17, 2010 9:41 AM  Date/Time Reported: June 17, 2010 9:41 AM    INR: 1.8   (Normal Range: 0.88-1.12   Therap INR: 2.0-3.5) Comments: Wynona Canes, CMA  June 17, 2010 9:41 AM       ANTICOAGULATION RECORD PREVIOUS REGIMEN & LAB RESULTS Anticoagulation Diagnosis:  Deep venous thrombosis on  01/02/2007 Previous INR Goal Range:  2.0-3.0 on  11/23/2007 Previous INR:  2.1 on  05/28/2010 Previous Coumadin Dose(mg):  5mg  only on sundays then 2.5mg  qd on  08/22/2009 Previous Regimen:  same on  05/28/2010 Previous Coagulation Comments:  Dr. Caryl Never approved on  05/28/2010  NEW REGIMEN & LAB RESULTS Current INR: 1.8 Current Coumadin Dose(mg): 5mg  on wed & sun 2.5mg  on other days Regimen: same  (no change) Coagulation Comments: continue current dose      Repeat testing in:  OV MEDICATIONS WARFARIN SODIUM 5 MG TABS (WARFARIN SODIUM) Use as directed by Anticoagulation Clinic DIGOXIN 0.125 MG  TABS (DIGOXIN) 1 tab by mouth every other day LASIX 40 MG  TABS (FUROSEMIDE) once daily OMEGA-3 KRILL OIL 300 MG CAPS (KRILL OIL) two by mouth two times a day TYLENOL EXTRA STRENGTH 500 MG TABS (ACETAMINOPHEN) as needed SALINE NASAL SPRAY 0.65 % SOLN (SALINE) as needed DIOVAN 40 MG TABS (VALSARTAN) Take 1 tablet by mouth once a day ENSURE  LIQD (NUTRITIONAL SUPPLEMENTS) drink one can with meds daily LEVOTHYROXINE SODIUM 25 MCG TABS (LEVOTHYROXINE SODIUM) ONE by mouth DALY AMOXICILLIN 500 MG CAPS (AMOXICILLIN) 4 1 hour prior to dental work NITROFURANTOIN MACROCRYSTAL 50 MG CAPS (NITROFURANTOIN MACROCRYSTAL) 1  tab by mouth once daily THEREMS  TABS (MULTIPLE VITAMIN) 1 tab by mouth once daily   Anticoagulation Visit Questionnaire      Coumadin dose missed/changed:  No  Abnormal Bleeding Symptoms:  No   Any diet changes including alcohol intake, vegetables or greens since the last visit:  No Any illnesses or hospitalizations since the last visit:  No Any signs of clotting since the last visit (including chest discomfort, dizziness, shortness of breath, arm tingling, slurred speech, swelling or redness in leg):  No   Appended Document: Orders Update    Clinical Lists Changes  Orders: Added new Service order of Specimen Handling (16109) - Signed

## 2010-07-02 NOTE — Cardiovascular Report (Signed)
Summary: Office Visit   Office Visit   Imported By: Roderic Ovens 05/29/2010 16:29:21  _____________________________________________________________________  External Attachment:    Type:   Image     Comment:   External Document

## 2010-07-02 NOTE — Assessment & Plan Note (Signed)
Summary: pt/cjr//DAUGHTER RESCD//CCM  Nurse Visit   Allergies: 1)  Zocor Laboratory Results   Blood Tests   Date/Time Received: November 18, 2009 4:19 PM  Date/Time Reported: November 18, 2009 4:19 PM    INR: 2.4   (Normal Range: 0.88-1.12   Therap INR: 2.0-3.5) Comments: Wynona Canes, CMA  November 18, 2009 4:19 PM     Orders Added: 1)  Est. Patient Level I [99211] 2)  Protime [16109UE]  Laboratory Results   Blood Tests      INR: 2.4   (Normal Range: 0.88-1.12   Therap INR: 2.0-3.5) Comments: Wynona Canes, CMA  November 18, 2009 4:19 PM       ANTICOAGULATION RECORD PREVIOUS REGIMEN & LAB RESULTS Anticoagulation Diagnosis:  Deep venous thrombosis on  01/02/2007 Previous INR Goal Range:  2.0-3.0 on  11/23/2007 Previous INR:  1.9 on  10/16/2009 Previous Coumadin Dose(mg):  5mg  only on sundays then 2.5mg  qd on  08/22/2009 Previous Regimen:  same dose on  09/18/2009 Previous Coagulation Comments:  Approved by Dr. Kirtland Bouchard on  01/01/2009  NEW REGIMEN & LAB RESULTS Current INR: 2.4 Regimen: same dose  (no change)       Repeat testing in: 4 weeks MEDICATIONS COUMADIN 2.5 MG  TABS (WARFARIN SODIUM) As directed 2.5 daily except one time a week 5mg . MULTIVITAMINS   TABS (MULTIPLE VITAMIN) Take one tablet by mouth daily AMIODARONE HCL 200 MG  TABS (AMIODARONE HCL) one daily DIGOXIN 0.125 MG  TABS (DIGOXIN) once daily LASIX 40 MG  TABS (FUROSEMIDE) once daily FISH OIL CONCENTRATE 1000 MG CAPS (OMEGA-3 FATTY ACIDS) two caps two times a day TYLENOL EXTRA STRENGTH 500 MG TABS (ACETAMINOPHEN) as needed SALINE NASAL SPRAY 0.65 % SOLN (SALINE) as needed DIOVAN 40 MG TABS (VALSARTAN) Take 1 tablet by mouth once a day ENSURE  LIQD (NUTRITIONAL SUPPLEMENTS) drink one can with meds daily LEVOTHYROXINE SODIUM 25 MCG TABS (LEVOTHYROXINE SODIUM) ONE by mouth DALY LUDENS COUGH DROPS 3-60 MG LOZG (MENTHOL-ASCORBIC ACID) as needed AMOXICILLIN 500 MG CAPS (AMOXICILLIN) 4 1 hour prior to dental  work   Anticoagulation Visit Questionnaire      Coumadin dose missed/changed:  No      Abnormal Bleeding Symptoms:  No   Any diet changes including alcohol intake, vegetables or greens since the last visit:  No Any illnesses or hospitalizations since the last visit:  No Any signs of clotting since the last visit (including chest discomfort, dizziness, shortness of breath, arm tingling, slurred speech, swelling or redness in leg):  No

## 2010-07-02 NOTE — Miscellaneous (Signed)
Summary: Physician Orders/[facility]  Physician Orders/[facility]   Imported By: Maryln Gottron 03/30/2010 14:41:42  _____________________________________________________________________  External Attachment:    Type:   Image     Comment:   External Document

## 2010-07-02 NOTE — Assessment & Plan Note (Signed)
Summary: pt/njr  Nurse Visit   Allergies: 1)  Zocor Laboratory Results   Blood Tests      INR: 2.1   (Normal Range: 0.88-1.12   Therap INR: 2.0-3.5) Comments: Rita Ohara  May 28, 2010 2:09 PM     Orders Added: 1)  Est. Patient Level I [99211] 2)  Protime [[id number]]   ANTICOAGULATION RECORD PREVIOUS REGIMEN & LAB RESULTS Anticoagulation Diagnosis:  Deep venous thrombosis on  01/02/2007 Previous INR Goal Range:  2.0-3.0 on  11/23/2007 Previous INR:  1.6 on  05/15/2010 Previous Coumadin Dose(mg):  5mg  only on sundays then 2.5mg  qd on  08/22/2009 Previous Regimen:  5mg . today then 5mg . wed. and Sun. all others 2.5mg  on  05/15/2010 Previous Coagulation Comments:  Dr. Kirtland Bouchard approved on  05/15/2010  NEW REGIMEN & LAB RESULTS Current INR: 2.1 Regimen: same Coagulation Comments: Dr. Caryl Never approved Repeat testing in: 2 weeks  Anticoagulation Visit Questionnaire Coumadin dose missed/changed:  No Abnormal Bleeding Symptoms:  No  Any diet changes including alcohol intake, vegetables or greens since the last visit:  No Any illnesses or hospitalizations since the last visit:  No Any signs of clotting since the last visit (including chest discomfort, dizziness, shortness of breath, arm tingling, slurred speech, swelling or redness in leg):  No  MEDICATIONS WARFARIN SODIUM 5 MG TABS (WARFARIN SODIUM) Use as directed by Anticoagulation Clinic DIGOXIN 0.125 MG  TABS (DIGOXIN) 1 tab by mouth every other day LASIX 40 MG  TABS (FUROSEMIDE) once daily FISH OIL CONCENTRATE 1000 MG CAPS (OMEGA-3 FATTY ACIDS) two caps two times a day TYLENOL EXTRA STRENGTH 500 MG TABS (ACETAMINOPHEN) as needed SALINE NASAL SPRAY 0.65 % SOLN (SALINE) as needed DIOVAN 40 MG TABS (VALSARTAN) Take 1 tablet by mouth once a day ENSURE  LIQD (NUTRITIONAL SUPPLEMENTS) drink one can with meds daily LEVOTHYROXINE SODIUM 25 MCG TABS (LEVOTHYROXINE SODIUM) ONE by mouth DALY AMOXICILLIN 500 MG CAPS  (AMOXICILLIN) 4 1 hour prior to dental work NITROFURANTOIN MACROCRYSTAL 50 MG CAPS (NITROFURANTOIN MACROCRYSTAL) 1  tab by mouth once daily THEREMS  TABS (MULTIPLE VITAMIN) 1 tab by mouth once daily

## 2010-07-08 NOTE — Miscellaneous (Signed)
Summary: [facility]  [facility]   Imported By: Maryln Gottron 07/02/2010 09:13:29  _____________________________________________________________________  External Attachment:    Type:   Image     Comment:   External Document

## 2010-07-08 NOTE — Miscellaneous (Signed)
Summary: [facility]  [facility]   Imported By: Maryln Gottron 07/03/2010 13:06:54  _____________________________________________________________________  External Attachment:    Type:   Image     Comment:   External Document

## 2010-07-15 ENCOUNTER — Other Ambulatory Visit (INDEPENDENT_AMBULATORY_CARE_PROVIDER_SITE_OTHER): Payer: Medicare PPO | Admitting: Internal Medicine

## 2010-07-15 DIAGNOSIS — I4891 Unspecified atrial fibrillation: Secondary | ICD-10-CM

## 2010-07-15 LAB — POCT INR: INR: 2

## 2010-07-24 ENCOUNTER — Encounter: Payer: Self-pay | Admitting: Cardiovascular Disease

## 2010-07-24 ENCOUNTER — Ambulatory Visit (INDEPENDENT_AMBULATORY_CARE_PROVIDER_SITE_OTHER): Payer: Medicare PPO | Admitting: Cardiovascular Disease

## 2010-07-24 DIAGNOSIS — I5022 Chronic systolic (congestive) heart failure: Secondary | ICD-10-CM

## 2010-07-24 DIAGNOSIS — I4891 Unspecified atrial fibrillation: Secondary | ICD-10-CM

## 2010-07-28 ENCOUNTER — Encounter: Payer: Self-pay | Admitting: Cardiovascular Disease

## 2010-07-28 NOTE — Assessment & Plan Note (Addendum)
Summary: rov   Visit Type:  Follow-up Primary Provider:  Stacie Glaze MD  CC:  No complaints.  History of Present Illness: 77 year-old woman with severe nonischemic cardiomyopathy following bioprosthetic mitral valve replacement and paroxysmal AF presenting for follow-up evaluation. She was taken off of Amiodarone several months ago for elevated LFT's.   She is doing very well at present. Has no chest pain, dyspnea, or edema. No palps or lightheadedness.    Current Medications (verified): 1)  Warfarin Sodium 5 Mg Tabs (Warfarin Sodium) .... Use As Directed By Anticoagulation Clinic 2)  Digoxin 0.125 Mg  Tabs (Digoxin) .Marland Kitchen.. 1 Tab By Mouth Every Other Day 3)  Lasix 40 Mg  Tabs (Furosemide) .... Once Daily 4)  Omega-3 Krill Oil 300 Mg Caps (Krill Oil) .... Two By Mouth Two Times A Day 5)  Tylenol Extra Strength 500 Mg Tabs (Acetaminophen) .... As Needed 6)  Saline Nasal Spray 0.65 % Soln (Saline) .... As Needed 7)  Diovan 40 Mg Tabs (Valsartan) .... Take 1 Tablet By Mouth Once A Day 8)  Ensure  Liqd (Nutritional Supplements) .... Drink One Can With Meds Daily 9)  Levothyroxine Sodium 25 Mcg Tabs (Levothyroxine Sodium) .... One By Mouth Daly 10)  Amoxicillin 500 Mg Caps (Amoxicillin) .... 4 1 Hour Prior To Dental Work 11)  Nitrofurantoin Macrocrystal 50 Mg Caps (Nitrofurantoin Macrocrystal) .Marland Kitchen.. 1  Tab By Mouth Once Daily 12)  Therems  Tabs (Multiple Vitamin) .Marland Kitchen.. 1 Tab By Mouth Once Daily  Allergies: 1)  Zocor  Past History:  Past medical history reviewed for relevance to current acute and chronic problems.  Past Medical History: Reviewed history from 04/03/2009 and no changes required. Atrial fibrillation Dyslipidemia Myxomatous Mitral Valve dz status post mitral valve replacement 2006 CARDIOMYOPATHY (ICD-425.4), nonischemic EF 15% Coumadin therapy chronic CHF NYH class 3 CHF S/P ICD (Guidant Vitality ICD model #T175), implanted 01/23/2007  Review of Systems    Negative except as per HPI   Vital Signs:  Patient profile:   77 year old female Height:      62 inches Weight:      122 pounds BMI:     22.39 Pulse rate:   76 / minute Pulse rhythm:   regular Resp:     18 per minute BP sitting:   120 / 68  (left arm) Cuff size:   large  Vitals Entered By: Vikki Ports (July 24, 2010 9:31 AM)  Physical Exam  General:  Pt is alert and oriented, elderly woman in no acute distress. HEENT: normal Neck: normal carotid upstrokes without bruits, JVP normal Lungs: CTA CV: RRR without murmur or gallop Abd: soft, NT, positive BS, no bruit, no organomegaly Ext: no clubbing, cyanosis, or edema. peripheral pulses 2+ and equal Skin: warm and dry without rash     EKG  Procedure date:  07/24/2010  Findings:      NSR with nonspecific ST-T wave changes, HR 76 bpm.   ICD Specifications Following MD:  Lewayne Bunting, MD     Referring MD:  Excell Seltzer ICD Vendor:  [facility] [employer]     ICD Model Number:  T175     ICD Serial Number:  [id number] ICD DOI:  01/23/2007     ICD Implanting MD:  Lewayne Bunting, MD  Lead 1:    Location: RV     DOI: 01/23/2007     Model #: [id number]     Serial #: [id number]     Status: active  Indications::  ICM  ICD Follow Up ICD Dependent:  No      Episodes Coumadin:  Yes  Brady Parameters Mode VVI     Lower Rate Limit:  40      Tachy Zones VF:  240     VT:  200     VT1:  180     Impression & Recommendations:  Problem # 1:  SYSTOLIC HEART FAILURE, CHRONIC (ICD-428.22) Pt remains stable without evidence of volume overload. Will continue current medical program without changes. Her BP has been borderline in the past and she has had an episode of syncope. She should be on a beta blocker in the setting of cardiomyopathy. Will review this with her and consider addition of a low-dose of Toprol XL.  Her updated medication list for this problem includes:    Warfarin Sodium 5 Mg Tabs (Warfarin sodium) ..... Use as directed by  anticoagulation clinic    Digoxin 0.125 Mg Tabs (Digoxin) .Marland Kitchen... 1 tab by mouth every other day    Lasix 40 Mg Tabs (Furosemide) ..... Once daily    Diovan 40 Mg Tabs (Valsartan) .Marland Kitchen... Take 1 tablet by mouth once a day  Problem # 2:  HYPERLIPIDEMIA, TYPE II (ICD-272.4) Lipids elevated. Normal coronaries in past. Has chronic low-grade elevation of transaminases. I think risk of statin Rx outweighs benefit.  CHOL: 286 (06/17/2010)   LDL: 79 (07/13/2006)   HDL: 74.80 (06/17/2010)   TG: 56.0 (06/17/2010)  Problem # 3:  ATRIAL FIBRILLATION (ICD-427.31) Maintaining sinus rhythm. Continue anticoagulation.   Her updated medication list for this problem includes:    Warfarin Sodium 5 Mg Tabs (Warfarin sodium) ..... Use as directed by anticoagulation clinic    Digoxin 0.125 Mg Tabs (Digoxin) .Marland Kitchen... 1 tab by mouth every other day  Orders: EKG w/ Interpretation (93000)  Patient Instructions: 1)  Your physician recommends that you continue on your current medications as directed. Please refer to the Current Medication list given to you today. 2)  Your physician wants you to follow-up in:  6 MONTHS.  You will receive a reminder letter in the mail two months in advance. If you don't receive a letter, please call our office to schedule the follow-up appointment.  Appended Document: rov Comment from Dr Noell Shular---do you mind calling and starting toprol xl 25 mg daily?  I called [facility] and left a message for the patient care coordinator to contact our office about starting this medication.  Appended Document: rov Jasmine December called back and would like an order faxed to [phone] for Metoprolol Succinate to be started. Order faxed.   Clinical Lists Changes  Medications: Added new medication of METOPROLOL SUCCINATE 25 MG XR24H-TAB (METOPROLOL SUCCINATE) Take one tablet by mouth daily

## 2010-07-29 ENCOUNTER — Telehealth: Payer: Self-pay | Admitting: Cardiovascular Disease

## 2010-08-06 NOTE — Progress Notes (Signed)
Summary: new medication pt suppose to take  Phone Note Call from Patient Call back at Home Phone [phone]   Caller: DaughterEileen Stanford- [phone] Reason for Call: Talk to Nurse Summary of Call: pt live in an assistance living. what's the new medication that pt suppose to be taken .  Initial call taken by: Lorne Skeens,  July 29, 2010 9:40 AM  Follow-up for Phone Call        I spoke with the pt's daughter and made her aware that Dr Excell Seltzer wanted the pt to restart Metoprolol Succ 25mg  daily. Order was faxed yesterday to assisted living.  Follow-up by: Julieta Gutting, RN, BSN,  July 29, 2010 10:24 AM

## 2010-08-06 NOTE — Medication Information (Signed)
Summary: Med Orders  Med Orders   Imported By: Marylou Mccoy 07/31/2010 13:35:25  _____________________________________________________________________  External Attachment:    Type:   Image     Comment:   External Document

## 2010-08-12 ENCOUNTER — Ambulatory Visit: Payer: Medicare PPO

## 2010-08-12 DIAGNOSIS — I4891 Unspecified atrial fibrillation: Secondary | ICD-10-CM

## 2010-08-13 ENCOUNTER — Encounter: Payer: Self-pay | Admitting: Internal Medicine

## 2010-08-13 ENCOUNTER — Encounter (INDEPENDENT_AMBULATORY_CARE_PROVIDER_SITE_OTHER): Payer: Medicare PPO

## 2010-08-13 DIAGNOSIS — I428 Other cardiomyopathies: Secondary | ICD-10-CM

## 2010-08-18 NOTE — Procedures (Signed)
Summary: device check/sl  mca   Current Medications (verified): 1)  Warfarin Sodium 5 Mg Tabs (Warfarin Sodium) .... Use As Directed By Anticoagulation Clinic 2)  Digoxin 0.125 Mg  Tabs (Digoxin) .Marland Kitchen.. 1 Tab By Mouth Every Other Day 3)  Lasix 40 Mg  Tabs (Furosemide) .... Once Daily 4)  Omega-3 Krill Oil 300 Mg Caps (Krill Oil) .... Two By Mouth Two Times A Day 5)  Tylenol Extra Strength 500 Mg Tabs (Acetaminophen) .... As Needed 6)  Saline Nasal Spray 0.65 % Soln (Saline) .... As Needed 7)  Diovan 40 Mg Tabs (Valsartan) .... Take 1 Tablet By Mouth Once A Day 8)  Ensure  Liqd (Nutritional Supplements) .... Drink One Can With Meds Daily 9)  Levothyroxine Sodium 25 Mcg Tabs (Levothyroxine Sodium) .... One By Mouth Daly 10)  Amoxicillin 500 Mg Caps (Amoxicillin) .... 4 1 Hour Prior To Dental Work 11)  Nitrofurantoin Macrocrystal 50 Mg Caps (Nitrofurantoin Macrocrystal) .Marland Kitchen.. 1  Tab By Mouth Once Daily 12)  Therems  Tabs (Multiple Vitamin) .Marland Kitchen.. 1 Tab By Mouth Once Daily 13)  Metoprolol Succinate 25 Mg Xr24h-Tab (Metoprolol Succinate) .... Take One Tablet By Mouth Daily  Allergies (verified): 1)  Zocor   ICD Specifications Following MD:  Lewayne Bunting, MD     Referring MD:  Excell Seltzer ICD Vendor:  [facility] [employer]     ICD Model Number:  T175     ICD Serial Number:  [id number] ICD DOI:  01/23/2007     ICD Implanting MD:  Lewayne Bunting, MD  Lead 1:    Location: RV     DOI: 01/23/2007     Model #: [id number]     Serial #: [id number]     Status: active  Indications::  ICM   ICD Follow Up ICD Dependent:  No      Episodes Coumadin:  Yes  Brady Parameters Mode VVI     Lower Rate Limit:  40      Tachy Zones VF:  240     VT:  200     VT1:  180     Tech Comments:  See PaceArt

## 2010-08-31 LAB — DIFFERENTIAL
Basophils Relative: 0 % (ref 0–1)
Eosinophils Absolute: 0 10*3/uL (ref 0.0–0.7)
Eosinophils Relative: 0 % (ref 0–5)
Lymphs Abs: 0.3 10*3/uL — ABNORMAL LOW (ref 0.7–4.0)
Monocytes Absolute: 0.3 10*3/uL (ref 0.1–1.0)
Monocytes Relative: 4 % (ref 3–12)

## 2010-08-31 LAB — POCT I-STAT, CHEM 8
BUN: 16 mg/dL (ref 6–23)
Calcium, Ion: 1.04 mmol/L — ABNORMAL LOW (ref 1.12–1.32)
Chloride: 97 mEq/L (ref 96–112)
Creatinine, Ser: 0.9 mg/dL (ref 0.4–1.2)
Glucose, Bld: 137 mg/dL — ABNORMAL HIGH (ref 70–99)
HCT: 35 % — ABNORMAL LOW (ref 36.0–46.0)
Potassium: 3.5 mEq/L (ref 3.5–5.1)
Sodium: 133 mEq/L — ABNORMAL LOW (ref 135–145)

## 2010-08-31 LAB — COMPREHENSIVE METABOLIC PANEL
ALT: 91 U/L — ABNORMAL HIGH (ref 0–35)
AST: 55 U/L — ABNORMAL HIGH (ref 0–37)
Albumin: 3 g/dL — ABNORMAL LOW (ref 3.5–5.2)
Alkaline Phosphatase: 61 U/L (ref 39–117)
Calcium: 8.5 mg/dL (ref 8.4–10.5)
GFR calc Af Amer: >60 mL/min (ref >60)
Glucose, Bld: 140 mg/dL — ABNORMAL HIGH (ref 70–99)
Potassium: 3.5 mEq/L (ref 3.5–5.1)
Sodium: 132 mEq/L — ABNORMAL LOW (ref 135–145)
Total Protein: 6.2 g/dL (ref 6.0–8.3)

## 2010-08-31 LAB — PROTIME-INR: Prothrombin Time: 21.1 seconds — ABNORMAL HIGH (ref 11.6–15.2)

## 2010-08-31 LAB — URINALYSIS, ROUTINE W REFLEX MICROSCOPIC
Bilirubin Urine: NEGATIVE
Glucose, UA: NEGATIVE mg/dL
Protein, ur: NEGATIVE mg/dL
Urobilinogen, UA: 1 mg/dL (ref 0.0–1.0)

## 2010-08-31 LAB — POCT CARDIAC MARKERS
CKMB, poc: <1 ng/mL — ABNORMAL LOW (ref 1.0–8.0)
Troponin i, poc: <0.05 ng/mL (ref 0.00–0.09)

## 2010-08-31 LAB — CBC
Hemoglobin: 12.1 g/dL (ref 12.0–15.0)
MCHC: 34.7 g/dL (ref 30.0–36.0)
MCV: 94.1 fL (ref 78.0–100.0)
RDW: 14.8 % (ref 11.5–15.5)

## 2010-08-31 LAB — URINE MICROSCOPIC-ADD ON

## 2010-09-02 LAB — COMPREHENSIVE METABOLIC PANEL
ALT: 176 U/L — ABNORMAL HIGH (ref 0–35)
Albumin: 3.7 g/dL (ref 3.5–5.2)
Alkaline Phosphatase: 78 U/L (ref 39–117)
Chloride: 95 mEq/L — ABNORMAL LOW (ref 96–112)
Glucose, Bld: 104 mg/dL — ABNORMAL HIGH (ref 70–99)
Potassium: 3.6 mEq/L (ref 3.5–5.1)
Sodium: 132 mEq/L — ABNORMAL LOW (ref 135–145)
Total Bilirubin: 0.7 mg/dL (ref 0.3–1.2)
Total Protein: 6.8 g/dL (ref 6.0–8.3)

## 2010-09-02 LAB — DIFFERENTIAL
Basophils Absolute: 0 K/uL (ref 0.0–0.1)
Basophils Relative: 1 % (ref 0–1)
Eosinophils Absolute: 0.1 K/uL (ref 0.0–0.7)
Eosinophils Relative: 1 % (ref 0–5)
Lymphocytes Relative: 15 % (ref 12–46)
Lymphs Abs: 1.1 K/uL (ref 0.7–4.0)
Monocytes Absolute: 0.9 K/uL (ref 0.1–1.0)
Monocytes Relative: 13 % — ABNORMAL HIGH (ref 3–12)
Neutro Abs: 5.1 K/uL (ref 1.7–7.7)
Neutrophils Relative %: 71 % (ref 43–77)

## 2010-09-02 LAB — URINALYSIS, ROUTINE W REFLEX MICROSCOPIC
Bilirubin Urine: NEGATIVE
Glucose, UA: NEGATIVE mg/dL
Hgb urine dipstick: NEGATIVE
Specific Gravity, Urine: 1.017 (ref 1.005–1.030)
pH: 6.5 (ref 5.0–8.0)

## 2010-09-02 LAB — PROTIME-INR
INR: 3.69 — ABNORMAL HIGH (ref 0.00–1.49)
Prothrombin Time: 36.3 s — ABNORMAL HIGH (ref 11.6–15.2)

## 2010-09-02 LAB — URINE MICROSCOPIC-ADD ON

## 2010-09-02 LAB — DIGOXIN LEVEL: Digoxin Level: 1.2 ng/mL (ref 0.8–2.0)

## 2010-09-02 LAB — CBC
HCT: 37.3 % (ref 36.0–46.0)
Hemoglobin: 13.1 g/dL (ref 12.0–15.0)
MCHC: 35.1 g/dL (ref 30.0–36.0)
MCV: 91.5 fL (ref 78.0–100.0)
Platelets: 218 K/uL (ref 150–400)
RBC: 4.08 MIL/uL (ref 3.87–5.11)
RDW: 12.9 % (ref 11.5–15.5)
WBC: 7.2 K/uL (ref 4.0–10.5)

## 2010-09-03 LAB — URINALYSIS, ROUTINE W REFLEX MICROSCOPIC
Glucose, UA: NEGATIVE mg/dL
Nitrite: NEGATIVE
Protein, ur: NEGATIVE mg/dL
Urobilinogen, UA: 2 mg/dL — ABNORMAL HIGH (ref 0.0–1.0)

## 2010-09-03 LAB — URINE CULTURE

## 2010-09-03 LAB — COMPREHENSIVE METABOLIC PANEL
AST: 59 U/L — ABNORMAL HIGH (ref 0–37)
Albumin: 3.4 g/dL — ABNORMAL LOW (ref 3.5–5.2)
Chloride: 96 mEq/L (ref 96–112)
Creatinine, Ser: 1.07 mg/dL (ref 0.4–1.2)
GFR calc Af Amer: 60 mL/min — ABNORMAL LOW (ref >60)
Potassium: 3.9 mEq/L (ref 3.5–5.1)
Total Bilirubin: 0.7 mg/dL (ref 0.3–1.2)

## 2010-09-03 LAB — DIFFERENTIAL
Basophils Absolute: 0 10*3/uL (ref 0.0–0.1)
Eosinophils Relative: 0 % (ref 0–5)
Lymphocytes Relative: 6 % — ABNORMAL LOW (ref 12–46)
Monocytes Absolute: 0 10*3/uL — ABNORMAL LOW (ref 0.1–1.0)

## 2010-09-03 LAB — CBC
MCV: 93.1 fL (ref 78.0–100.0)
Platelets: 188 10*3/uL (ref 150–400)
WBC: 13.6 10*3/uL — ABNORMAL HIGH (ref 4.0–10.5)

## 2010-09-03 LAB — URINE MICROSCOPIC-ADD ON

## 2010-09-06 LAB — DIFFERENTIAL
Basophils Absolute: 0 10*3/uL (ref 0.0–0.1)
Basophils Relative: 1 % (ref 0–1)
Eosinophils Absolute: 0.1 10*3/uL (ref 0.0–0.7)
Eosinophils Relative: 1 % (ref 0–5)
Lymphocytes Relative: 12 % (ref 12–46)
Lymphs Abs: 0.8 10*3/uL (ref 0.7–4.0)
Monocytes Absolute: 0.5 K/uL (ref 0.1–1.0)
Monocytes Relative: 7 % (ref 3–12)
Neutro Abs: 5.4 K/uL (ref 1.7–7.7)
Neutrophils Relative %: 79 % — ABNORMAL HIGH (ref 43–77)

## 2010-09-06 LAB — POCT CARDIAC MARKERS
CKMB, poc: 1.1 ng/mL (ref 1.0–8.0)
CKMB, poc: 1.3 ng/mL (ref 1.0–8.0)
Myoglobin, poc: 42.1 ng/mL (ref 12–200)
Myoglobin, poc: 43.7 ng/mL (ref 12–200)
Troponin i, poc: <0.05 ng/mL (ref 0.00–0.09)
Troponin i, poc: <0.05 ng/mL (ref 0.00–0.09)

## 2010-09-06 LAB — COMPREHENSIVE METABOLIC PANEL
ALT: 52 U/L — ABNORMAL HIGH (ref 0–35)
AST: 48 U/L — ABNORMAL HIGH (ref 0–37)
Alkaline Phosphatase: 75 U/L (ref 39–117)
CO2: 29 mEq/L (ref 19–32)
Chloride: 94 mEq/L — ABNORMAL LOW (ref 96–112)
GFR calc non Af Amer: >60 mL/min (ref >60)
Potassium: 4.3 mEq/L (ref 3.5–5.1)
Total Bilirubin: 0.5 mg/dL (ref 0.3–1.2)

## 2010-09-06 LAB — COMPREHENSIVE METABOLIC PANEL WITH GFR
Albumin: 4 g/dL (ref 3.5–5.2)
BUN: 21 mg/dL (ref 6–23)
Calcium: 9 mg/dL (ref 8.4–10.5)
Creatinine, Ser: 0.8 mg/dL (ref 0.4–1.2)
GFR calc Af Amer: >60 mL/min (ref >60)
Glucose, Bld: 91 mg/dL (ref 70–99)
Sodium: 134 meq/L — ABNORMAL LOW (ref 135–145)
Total Protein: 7.5 g/dL (ref 6.0–8.3)

## 2010-09-06 LAB — URINALYSIS, ROUTINE W REFLEX MICROSCOPIC
Bilirubin Urine: NEGATIVE
Glucose, UA: NEGATIVE mg/dL
Hgb urine dipstick: NEGATIVE
Ketones, ur: NEGATIVE mg/dL
Nitrite: NEGATIVE
Protein, ur: NEGATIVE mg/dL
Specific Gravity, Urine: 1.007 (ref 1.005–1.030)
Urobilinogen, UA: 1 mg/dL (ref 0.0–1.0)
pH: 7 (ref 5.0–8.0)

## 2010-09-06 LAB — CBC
HCT: 38.3 % (ref 36.0–46.0)
Hemoglobin: 13.3 g/dL (ref 12.0–15.0)
MCHC: 34.8 g/dL (ref 30.0–36.0)
MCV: 92.3 fL (ref 78.0–100.0)
Platelets: 172 10*3/uL (ref 150–400)
RBC: 4.15 MIL/uL (ref 3.87–5.11)
RDW: 13.5 % (ref 11.5–15.5)
WBC: 6.8 K/uL (ref 4.0–10.5)

## 2010-09-06 LAB — PROTIME-INR
INR: 2.9 — ABNORMAL HIGH (ref 0.00–1.49)
Prothrombin Time: 32.6 s — ABNORMAL HIGH (ref 11.6–15.2)

## 2010-09-06 LAB — APTT: aPTT: 35 s (ref 24–37)

## 2010-09-06 LAB — URINE MICROSCOPIC-ADD ON

## 2010-09-06 LAB — LIPASE, BLOOD: Lipase: 157 U/L (ref 23–300)

## 2010-09-06 LAB — DIGOXIN LEVEL: Digoxin Level: 0.7 ng/mL — ABNORMAL LOW (ref 0.8–2.0)

## 2010-09-09 ENCOUNTER — Encounter: Payer: Self-pay | Admitting: Internal Medicine

## 2010-09-11 ENCOUNTER — Other Ambulatory Visit (INDEPENDENT_AMBULATORY_CARE_PROVIDER_SITE_OTHER): Payer: Medicare PPO

## 2010-09-11 DIAGNOSIS — I4891 Unspecified atrial fibrillation: Secondary | ICD-10-CM

## 2010-09-11 NOTE — Patient Instructions (Signed)
Same dose, 5 mg on wednesdays and sundays, 2.5 mg on other days Check in 4 weeks

## 2010-09-16 ENCOUNTER — Ambulatory Visit (INDEPENDENT_AMBULATORY_CARE_PROVIDER_SITE_OTHER): Payer: Medicare PPO | Admitting: Internal Medicine

## 2010-09-16 ENCOUNTER — Encounter: Payer: Self-pay | Admitting: Internal Medicine

## 2010-09-16 VITALS — BP 120/60 | HR 68 | Temp 98.2°F | Resp 16 | Ht 63.0 in | Wt 126.0 lb

## 2010-09-16 DIAGNOSIS — R634 Abnormal weight loss: Secondary | ICD-10-CM

## 2010-09-16 DIAGNOSIS — E038 Other specified hypothyroidism: Secondary | ICD-10-CM

## 2010-09-16 DIAGNOSIS — I428 Other cardiomyopathies: Secondary | ICD-10-CM

## 2010-09-16 DIAGNOSIS — E785 Hyperlipidemia, unspecified: Secondary | ICD-10-CM

## 2010-09-16 LAB — HEPATIC FUNCTION PANEL
ALT: 23 U/L (ref 0–35)
Bilirubin, Direct: 0.1 mg/dL (ref 0.0–0.3)
Total Protein: 6.9 g/dL (ref 6.0–8.3)

## 2010-09-16 NOTE — Assessment & Plan Note (Signed)
The patient is 77 years of age in stable condition would not complicate her medicines with side effects by adding cholesterol drugs at this time so therefore we will no longer monitor her lipid panel continuation of his visual is recommended due to his potential benefits on multiple levels

## 2010-09-16 NOTE — Assessment & Plan Note (Signed)
Her rate is well-controlled her cardiomyopathy is stable there is no evidence of congestive heart failure.

## 2010-09-16 NOTE — Assessment & Plan Note (Signed)
Patient's weight has now been stable for 6 months we will resolve this issue

## 2010-09-16 NOTE — Assessment & Plan Note (Signed)
Monitoring of her liver functions for potential medication side effects today

## 2010-09-16 NOTE — Assessment & Plan Note (Signed)
Her thyroid is checked 7 months ago she is due a thyroid profile today she takes her thyroid medicine on an empty stomach person in the morning

## 2010-09-16 NOTE — Progress Notes (Signed)
  Subjective:    Patient ID: Mary Arellano, female    DOB: 1926/10/14, 77 y.o.   MRN: [id number]  HPI Patient has been doing remarkably well her activity has been stable her weight has improved with the adding of supplemental shakes she denies any increased symptoms of congestive heart failure chest pain shortness of breath or swelling in her lower extremities.  She has been compliant with her medications she couldn't remember the name of her medications and when she takes them.  She will have appropriate laboratory monitoring today of her thyroid and her liver functions.  We discussed with her daughter the continued use of official without monitoring anymore lipid panels do to her age and the fact that we would not start her on a statin at this point the appropriate course is to discontinue lipid monitor.   Review of Systems  Constitutional: Positive for activity change. Negative for appetite change and fatigue.  HENT: Negative for ear pain, congestion, neck pain, postnasal drip and sinus pressure.   Eyes: Negative for redness and visual disturbance.  Respiratory: Positive for shortness of breath. Negative for cough and wheezing.   Gastrointestinal: Negative for abdominal pain and abdominal distention.  Genitourinary: Negative for dysuria, frequency and menstrual problem.  Musculoskeletal: Positive for arthralgias and gait problem. Negative for myalgias and joint swelling.  Skin: Positive for pallor. Negative for rash and wound.  Neurological: Positive for weakness. Negative for dizziness and headaches.  Hematological: Negative for adenopathy. Does not bruise/bleed easily.  Psychiatric/Behavioral: Negative for sleep disturbance and decreased concentration.   Past Medical History  Diagnosis Date  . AF (atrial fibrillation)   . Hyperlipidemia   . Myxomatous mitral valve     post mitral valve replacement 2006  . Other primary cardiomyopathies   . Encounter for long-term (current)  use of anticoagulants   . CHF NYHA class III    Past Surgical History  Procedure Date  . S/p icd (guidant vitality icd model #t175), i mplanted 01/23/2007   . Hemorroidectomy   . Tonsillectomy   . Mitral valve replacement 2006 (cow valve)   . Implantable defebrillator     reports that she quit smoking about 59 years ago. She does not have any smokeless tobacco history on file. She reports that she does not drink alcohol or use illicit drugs. family history is not on file. Allergies  Allergen Reactions  . Simvastatin     REACTION: liver function elevation       Objective:   Physical Exam  Nursing note and vitals reviewed. Constitutional: No distress.  HENT:  Head: Normocephalic and atraumatic.  Eyes: Conjunctivae are normal. Pupils are equal, round, and reactive to light.  Neck: Normal range of motion. Neck supple.  Cardiovascular: Normal rate and regular rhythm.   Pulmonary/Chest: Effort normal and breath sounds normal.  Abdominal: Soft. Bowel sounds are normal.  Musculoskeletal: She exhibits edema.  Neurological: She is alert.  Skin: Skin is warm and dry.  Psychiatric: Her behavior is normal. Thought content normal.          Assessment & Plan:

## 2010-10-09 ENCOUNTER — Ambulatory Visit (INDEPENDENT_AMBULATORY_CARE_PROVIDER_SITE_OTHER): Payer: Medicare PPO | Admitting: Internal Medicine

## 2010-10-09 DIAGNOSIS — I4891 Unspecified atrial fibrillation: Secondary | ICD-10-CM

## 2010-10-13 NOTE — Assessment & Plan Note (Signed)
[facility]                                 ON-CALL NOTE   EVERLYN, FARABAUGH                   MRN:          [id number]  DATE:04/28/2007                            DOB:          Jun 16, 1926    This is a dictation for Imogene Burn on April 28, 2007, at 5:09  p.m.  Date of birth is 1927/02/18.  Phone number is [phone].  Caller  is [employer], Clydie Braun, about a critical lab.  Patient is a regular  patient of Dr. Cato Mulligan and Dr. Lovell Sheehan.  I am Dr. Milinda Antis on call.  Clydie Braun  from [employer] said she had a critical potassium level of 6.2 and a  sodium level that was low at 128 that does not look hemolyzed.  I tried  to call the patient at home at [phone] and got an answering machine.  I  left her a message that she needs to go to the emergency room  immediately when she gets this message to have her labs rechecked to see  if they are correct and to be treated, and I will continue to try to  reach her through the night.     Marne A. Tower, MD  Electronically Signed    MAT/MedQ  DD: 04/28/2007  DT: 04/28/2007  Job #: [id number]   cc:   Valetta Mole. Swords, MD

## 2010-10-13 NOTE — Discharge Summary (Signed)
NAME:  Mary Arellano, RUDDY NO.:  [id number]   MEDICAL RECORD NO.:  [id number]          PATIENT TYPE:  INP   LOCATION:  6525                         FACILITY:  [facility]   PHYSICIAN:  Doylene Canning. Ladona Ridgel, MD    DATE OF BIRTH:  01-02-1927   DATE OF ADMISSION:  01/23/2007  DATE OF DISCHARGE:  01/24/2007                               DISCHARGE SUMMARY   This patient has no known drug allergies.   This dictation greater than 35 minutes.   FINAL DIAGNOSES:  1. Nonischemic cardiomyopathy, ejection fraction 15%.  2. Discharging day #1 status post implant of Guidant Vitality single-      chamber cardioverter-defibrillator.   SECONDARY DIAGNOSES:  1. Moderate tricuspid regurgitation.  2. Status post mitral valve replacement with pericardial tissue valve      February 2006.  3. Chronic Coumadin therapy.  4. Dyslipidemia.  5. New York Heart Association class II chronic systolic congestive      heart failure.  6. Cardioverter-defibrillator per, SCD-HEFT criteria.   PROCEDURE:  January 23, 2007, implant Guidant single-chamber cardioverter-  defibrillator by Drs. Lewayne Bunting.  The patient had no postprocedural  complications.  Her incision looks good postprocedure day #1, and the  patient is discharging August 26.   BRIEF HISTORY:  The patient is a 77 year old female.  She has a history  of nonischemic cardiomyopathy.  Her coronary arteries are normal.  Her  ejection fraction is 15%.  She has moderate tricuspid regurgitation.   She has a history of valvular heart disease with a myxomatous mitral  valve and severe mitral regurgitation.  She is status post mitral valve  replacement.  She had implant of a pericardial tissue valve February  2006.  She had postoperative atrial fibrillation.  She is currently in  sinus bradycardia.  She has been on chronic Coumadin therapy.  Her heart  failure symptoms are New York Heart Association class II.  She has  cardiac rehab several days a  week, exercise tolerance is good.  She  denies syncope.  She denies palpitation.  The risks and benefits of  implant of cardioverter-defibrillator for prophylaxis and primary  prevention are based on the SCD-HEFT.  Risks and benefits described to  the patient and she wishes to proceed.   HOSPITAL COURSE:  The patient presented electively to [facility] August 25.  She underwent implant of the Guidant Vitality II  cardioverter-defibrillator by Dr. Lewayne Bunting.  Defibrillator threshold  study was less than or equal to 14 joules, discharging postprocedure day  #1 on the following medications:   1. Coumadin 2.5 mg daily.  Her INR on postprocedure day #1, August 26,      is 1.5.  2. Metoprolol ER 25 mg two tablets daily.  3. Atacand 4 mg daily.  4. Pravachol 40 mg daily at bedtime.  5. Five multivitamin daily.   She is asked to keep her incision dry for the next 7 days and to sponge-  bathe until Monday, September 1.  Mobility of the left arm has been  described to the patient.  She is asked not to drive for  the next 1  week.  Follows up at Dr. Lovell Sheehan' office Thursday, August 28, at 10  o'clock for blood work.  She will proceed to the ICD clinic at [facility], [address] Wednesday September 10, at 9:40.  she will see Dr. Ladona Ridgel Wednesday, December 3, at 8:45.   Lab studies pertinent to this admission were drawn on August 19.  Complete blood count:  White cells 6.8, hemoglobin 13.6, hematocrit  39.1, the platelets are 121, slightly low.  Pro time 13.8, INR 1.3,  which is a subtherapeutic INR.  Sodium is 141, potassium 4.6, chloride  108, carbonate 30, glucose 115, BUN is 19, creatinine 0.9.      Maple Mirza, PA      Doylene Canning. Ladona Ridgel, MD  Electronically Signed    GM/MEDQ  D:  01/24/2007  T:  01/24/2007  Job:  [id number]   cc:   Stacie Glaze, MD  Tera Mater. Clent Ridges, MD

## 2010-10-13 NOTE — Assessment & Plan Note (Signed)
[facility]                         ELECTROPHYSIOLOGY OFFICE NOTE   Mary Arellano                 MRN:          [id number]  DATE:10/30/2007                            DOB:          04/18/1927    Mary Arellano returns today for follow-up.  She is a very pleasant  elderly woman with nonischemic cardiomyopathy, congestive heart failure  and atrial fibrillation, status post ICD insertion.  She returns today  for follow-up.  Her heart failure is very stable, class II.  She denies  chest pain.  She has very minimal dyspnea with exertion.   MEDICATIONS:  1. Coumadin as directed.  2. Atacand 2 mg daily.  3. Amiodarone 2 mg daily.  4. Lasix 40 a day.  5. Zocor 20 a day.  6. Digoxin 0.125 daily.   PHYSICAL EXAMINATION:  GENERAL APPEARANCE:  She is a pleasant, somewhat  frail-appearing elderly woman with distress.  VITAL SIGNS:  Blood pressure was 100/57, the pulse 61 and regular,  respirations were 18.  Weight was 99 pounds.  NECK:  No jugular venous distension.  LUNGS:  Clear bilaterally to auscultation.  No wheezes, rales or rhonchi  were present.  CARDIOVASCULAR:  Regular rate and rhythm.  Normal S1 and S2.  EXTREMITIES:  No cyanosis, clubbing or edema.  ABDOMEN:  Soft, nontender, nondistended.   Interrogation of her defibrillator demonstrates a Guidant Vitality.  The  R waves greater than 25, the impedance 658, threshold 0.4 volts at 0.4  milliseconds.  Battery voltage was 3.22 volts.  There were no  intercurrent IC therapies.   IMPRESSION:  1. Nonischemic cardiomyopathy.  2. Congestive heart failure.  3. Atrial fibrillation.  4. Status post implantable cardioverter defibrillator insertion.  5. Chronic amiodarone therapy.   DISCUSSION:  Mary Arellano is stable and her defibrillator is working  normally.  We will see her back in the office in one year.  She will  follow up in our device clinic in three months.    Doylene Canning.  Ladona Ridgel, MD  Electronically Signed   GWT/MedQ  DD: 10/30/2007  DT: 10/30/2007  Job #: [id number]

## 2010-10-13 NOTE — Assessment & Plan Note (Signed)
[facility]                            CARDIOLOGY OFFICE NOTE   Mary Arellano, Mary Arellano                   MRN:          [id number]  DATE:12/30/2006                            DOB:          [birth year]    PRIMARY CARDIOLOGIST:  Dr. Tonny Bollman.   PRIMARY CARE Maren Wiesen:  Dr. Gershon Crane.   PATIENT PROFILE:  A 77 year old Caucasian female with a history of  nonischemic cardiomyopathy who presents with mild dyspnea.   PROBLEM LIST:  1. Nonischemic cardiomyopathy.      a.     On August 17, 2004, cardiac catheterization, normal coronary       arteries.      b.     On July 13, 2006, 2D echocardiogram, EF is 10% to 20%       with severe, diffuse left ventricular hypokinesis, moderately       reduced RV systolic function, moderate TR.  2. History of myxomatous mitral valve disease and severe mitral      regurgitation, status post prosthetic mitral valve replacement with      a pericardial tissue valve in February 2006.  3. Postoperative atrial fibrillation.  4. Chronic Coumadin therapy.  5. Hyperlipidemia.  6. Remote hemorrhoidectomy.   HISTORY OF PRESENT ILLNESS:  A 77 year old Caucasian female with a prior  history of nonischemic cardiomyopathy, last seen in clinic by Dr. Excell Seltzer  on May 27.  At that time, she was doing reasonably well though she has  chronic dyspnea on exertion despite that she works out in cardiac rehab  3 days a week and has not noticed any significant drop off in her  exercise tolerance or reason to change her level of activity.  She had  been out of town this week, and, therefore, did not go to cardiac rehab  this week.  Her daughter reports that she has noticed that her mom has  been having some decrease in exercise tolerance outside and around the  house over the past week or so.  The patient somewhat agrees with that  assessment, but, overall, thinks that she has been feeling pretty well.  She does note an episode that  occurred yesterday at her sister's where  she was standing and felt as though someone was raking a washboard  inside of her abdomen and thorax, lasting just a second or so and then  resolving spontaneously.  Other than this, she did not have any specific  complaints with the exception of a nagging, dry, nonproductive cough  that she has had since about February which correlates with the time  that she initiated on Lisinopril.  She thinks it may be worse since her  Lisinopril dose was increased from 2.5 to 5 mg daily.  She, otherwise,  denies any PND, orthopnea, dizziness, syncope, edema, or early satiety.  She has not had any significant weight gain.   HOME MEDICATIONS:  1. Pravachol 40 mg daily.  2. Coumadin as directed.  3. Multivitamin daily.  4. Toprol ER 50 mg daily.  5. Lisinopril 5 mg daily.   PHYSICAL EXAMINATION:  Blood pressure 102/68, heart rate 83,  respirations 16.  Her weight is 105 pounds which is up 2 pounds since  May, but, overall, she had been down dating back to February of this  year.  A pleasant white female in no acute distress.  Awake, alert and oriented  x3.  NECK:  No bruits or JVD.  LUNGS:  Respiration is regular and unlabored, clear to auscultation.  CARDIAC:  Regular S1, S2, there is no S3, S4 or murmurs.  ABDOMEN:  Round, soft, nontender, nondistended.  Bowel sounds present  x4.  EXTREMITIES:  Warm, dry, pink, no cyanosis, clubbing, or edema.  Dorsalis pedis and posterior tibial pulses are 1+ and equal bilaterally.   ACCESSORY CLINICAL FINDINGS:  __________  1. Chronic systolic congestive heart failure/nonischemic      cardiomyopathy.  The patient presents today primarily because the      daughter thinks that she has been more short of breath than usual.      Despite this observation, Ms. Towry says she has been doing      reasonably well and has not changed any of her activities, has not      noted any drop in her exercise tolerance at cardiac  rehab.  She      shows no evidence of volume overload on exam, therefore, I will not      add diuretic therapy.  She is due for an echocardiogram which is      currently scheduled for August 14, and we have been able to move      this up to August 6.  She will, otherwise, follow up with Dr.      Excell Seltzer on August 14 and will continue her beta blocker at its      current dose.  Because she has been coughing with angiotensin-      converting enzyme inhibitor therapy, I will try stopping this and      add Atacand instead and see how she does.  2. Cough, question if this is secondary to angiotensin-converting      enzyme inhibitor therapy.  Discontinue Lisinopril, add Atacand 4 mg      daily.  3. Hyperlipidemia.  The patient is to continue  with Pravachol      therapy.  4. History of atrial fibrillation.  She is in sinus rhythm and remains      on Coumadin therapy.   DISPOSITION:  Echo on August 5, follow up with Dr. Excell Seltzer on August 14.      Nicolasa Ducking, ANP  Electronically Signed      Pricilla Riffle, MD, [facility]  Electronically Signed   CB/MedQ  DD: 12/30/2006  DT: 12/31/2006  Job #: [id number]

## 2010-10-13 NOTE — Assessment & Plan Note (Signed)
[facility]                            CARDIOLOGY OFFICE NOTE   PEARSON, REASONS                   MRN:          [id number]  DATE:03/01/2007                            DOB:          11/28/1926    This is a patient of Dr. Colvin Caroli and Dr. Lewayne Bunting.   PRIMARY CARE PHYSICIAN:  Dr. Gershon Crane.   Mary Arellano is a very pleasant 77 year old white female with severe  non-ischemic cardiomyopathy and New York Heart Association class 2  congestive heart failure. Her ejection fraction is 15% and she recently  received a defibrillator. She has had intermittent atrial fibrillation  in the past as well and she was started on Amiodarone one month ago by  Dr. Excell Seltzer. The patient complains of a several week history of increased  lower extremity edema, orthopnea, as well as dyspnea on exertion. She  cannot go up a flight of stairs without stopping and then continuing on.  She feels like she is smothering when she lays at night and has to prop  herself up on a big pillow. She is very conscientious about her sodium  intake and cannot figure out where she could possibly be getting any  salt in her diet according to her and her daughter. She is extremely  fatigued as well.   CURRENT MEDICATIONS:  1. Pravachol 40 mg daily.  2. Coumadin as directed.  3. Multivitamin daily.  4. Metoprolol 25 mg 2 daily.  5. Atacand 4 mg daily.  6. Amiodarone 200 mg daily.  7. Lasix 40 mg 2 daily.  8. Potassium 20 mEq daily.  9. Benadryl daily.   PHYSICAL EXAMINATION:  GENERAL:  This is a thin elderly 77 year old  white female in no acute distress.  VITAL SIGNS:  Blood pressure 92/76, pulse 70, weight 105.  NECK:  She does have increased JVD and HJR. No bruit or thyroid  enlargement.  LUNGS:  Pretty clear. She has a few crackles at the right base, but  clear elsewhere.  HEART:  Regular rate and rhythm at 70 beats-per-minute. Normal S1 and  S2. Positive S4  with a 2/6 systolic ejection murmur at the left sternal  border.  ABDOMEN:  Soft without organomegaly, masses, lesions, or abnormal  tenderness.  EXTREMITIES:  +2 pitting edema bilaterally halfway up her lower leg.   IMPRESSION:  1. Acute-on-chronic systolic heart failure.  2. Non-ischemic cardiomyopathy with class 2 to 3 heart failure,      ejection fraction 15%.  3. Status post ICD.  4. Paroxysmal atrial fibrillation currently in sinus rhythm.  5. Coumadin therapy.  6. Hyperlipidemia.  7. History of mitral valve disease status post mitral valve      replacement with pericardial tissue valve in 2006.   PLAN:  I have asked her to increase her Lasix to 80 mg b.i.d. for three  days and potassium to 40 mEq b.i.d. for three days. I have also given  her a prescription for TED hose and asked her to continue to watch her  sodium intake closely, as well as her food intake. If she is not feeling  better by Friday, she is asked to call our office and she will either  see Dr. Excell Seltzer or myself back next week in follow up.      Mary Reedy, PA-C  Electronically Signed      Mary [facility]. Mary Chance, MD, [facility]  Electronically Signed   ML/MedQ  DD: 03/01/2007  DT: 03/02/2007  Job #: [id number]   cc:   Mary Senior A. Clent Ridges, MD

## 2010-10-13 NOTE — Discharge Summary (Signed)
NAME:  ALAZE, GARVERICK NO.:  [id number]   MEDICAL RECORD NO.:  [id number]          PATIENT TYPE:  INP   LOCATION:  6525                         FACILITY:  [facility]   PHYSICIAN:  Maple Mirza, PA   DATE OF BIRTH:  June 18, 1926   DATE OF ADMISSION:  01/23/2007  DATE OF DISCHARGE:  01/24/2007                               DISCHARGE SUMMARY   ADDENDUM:  This addendum covers the augmented and improved medication  list for this patient at discharge, January 24, 2007.   DISCHARGE MEDICATIONS:  1. Pravachol 40 mg daily at bedtime.  2. Metoprolol 25-mg tablets 3 tablets daily or 75 mg daily.  3. Coumadin 2.5 mg daily.  4. Multivitamin.  5. Glucosamine 1 tablet daily.  6. Atacand 4 mg daily.  7. Mucinex as needed.  8. Darvocet-N 100 one-two tablets every 4-6 hours as needed for pain.      Maple Mirza, PA     GM/MEDQ  D:  01/24/2007  T:  01/24/2007  Job:  [id number]   cc:   Doylene Canning. Ladona Ridgel, MD  Veverly Fells. Excell Seltzer, MD  Stacie Glaze, MD

## 2010-10-13 NOTE — Assessment & Plan Note (Signed)
[facility]                         ELECTROPHYSIOLOGY OFFICE NOTE   JANNELY, HENTHORN                   MRN:          [id number]  DATE:02/15/2007                            DOB:          11-25-1926    Mary Arellano returns today for followup.  She is a very pleasant 77-  year-old woman with valvular heart disease and nonischemic  cardiomyopathy, congestive heart failure class II-III, and atrial  fibrillation.  She returns today for followup.  Her main complaint is  that of increasing fatigue, weakness, and shortness of breath.  She also  notices palpitations.  The patient notes that some weeks are pretty  good, but other times she gets weak, short of breath, and fatigued.  She  does not have palpitations, to speak of.  She denies dietary and medical  noncompliance.   PHYSICAL EXAMINATION:  She is a pleasant, chronically ill-appearing  elderly woman in no acute distress.  Blood pressure was 90/60, pulse 109 and irregularly irregular,  respirations 18.  Weight was 104 pounds.  NECK:  There was 7-8 cm jugular venous distention.  There was no  thyromegaly.  Trachea is midline.  LUNGS:  Rales in the bases bilaterally, but there were no wheezes or  rhonchi and certainly no increased work of breathing.  CARDIOVASCULAR:  Irregularly irregular tachycardia with normal S1 and  S2.  I did not appreciate any murmurs today.  The PMI was enlarged and  laterally displaced.  EXTREMITIES:  Trace peripheral edema bilaterally.  There was no clubbing  or cyanosis.   MEDICATIONS:  1. Pravachol 40 a day.  2. Coumadin as directed.  3. Metoprolol ER 50 mg daily.  4. Atacand 4 mg daily.   IMPRESSION:  1. Nonischemic cardiomyopathy.  2. Congestive heart failure.  3. Atrial fibrillation, which has been intermittent in the past.   DISCUSSION:  I have discussed treatment options with Ms. Amundson and  her daughter, who is with her today, in detail.  I think  her atrial  fibrillation is making her heart failure symptoms worse.  While she may  ultimately do better on carvedilol rather than metoprolol, my first  order would be to try to get her back into sinus rhythm, and to this  end, I have started her on amiodarone 400 mg daily, which will take her  at least a week and then decrease her dose to 200 mg daily with plans to  undergo DC cardioversion if she does not spontaneously return to sinus  rhythm on her own.  We will plan to see her back in several weeks.   ADDENDUM:  I have asked that she follow up to have her INR checked with  Dr. Lovell Sheehan office in the next week, as she will likely not need as much  Coumadin while she is on amiodarone.     Doylene Canning. Ladona Ridgel, MD  Electronically Signed    GWT/MedQ  DD: 02/15/2007  DT: 02/15/2007  Job #: [id number]   cc:   Stacie Glaze, MD

## 2010-10-13 NOTE — Assessment & Plan Note (Signed)
[facility]                         ELECTROPHYSIOLOGY OFFICE NOTE   Mary, Arellano                 MRN:          [id number]  DATE:08/22/2007                            DOB:          Mar 03, 1927    Mary Arellano returns today for follow-up.  I last saw her back in  September and at that time she was doing very poorly with symptomatic A  fib and worsening LV function and nonischemic cardiomyopathy.  The  patient however has been started on amiodarone and is back to sinus  rhythm.  Overall she is improved.  She denies chest pain.  She has no  dyspnea at present.  She has also been gaining weight.   MEDICATIONS:  1. Coumadin as directed.  2. Atacand 2 mg daily.  3. Amiodarone 200 mg daily.  4. Lasix 40 mg daily.  5. Zocor 20 a day.  6. Digoxin 0.125 daily.   PHYSICAL EXAM:  She is a pleasant, chronically-ill appearing,  frail,  elderly woman, but in no acute distress.  Blood pressure was 110/63, the pulse was 74 and regular, respirations 18  and the weight was 98.5 pounds.  NECK:  No jugular distention.  LUNGS:  Clear bilaterally.  The breath sounds were slightly decreased  but there were no wheezes or rhonchi or rales present.  CARDIOVASCULAR:  Regular rate and rhythm with normal S1 and S2. There  were no murmurs, rubs or gallops that I could appreciate today. PMI was  not enlarged or laterally displaced.  EXTREMITIES:  Demonstrated no peripheral edema.   IMPRESSION:  1. Congestive heart failure.  2. Nonischemic cardiomyopathy.  3. Atrial fibrillation.  4. History of congestive heart failure class II presently.   DISCUSSION:  Overall Mary Arellano is stable.  Her weight is well  maintained.  She has no heart failure symptoms to speak of at the  present time but is sedentary in  her activities. I will plan to see the  patient back in the office in 1 year and she will follow up with our  nursing staff in 3 months.    Doylene Canning.  Ladona Ridgel, MD  Electronically Signed   GWT/MedQ  DD: 08/22/2007  DT: 08/22/2007  Job #: [id number]

## 2010-10-13 NOTE — Assessment & Plan Note (Signed)
[facility]                            CARDIOLOGY OFFICE NOTE   Mary, Arellano                 MRN:          [id number]  DATE:07/07/2007                            DOB:          Apr 09, 1927    Mary Arellano was  seen in follow-up at the [facility]  office on July 07, 2007.  Mary Arellano is a 77 year old woman with  severe non-ischemic cardiomyopathy and LVEF less than 20%.  She has had  problems with low output congestive heart failure as well as a more  recent hospitalization with acute renal insufficiency and digoxin  toxicity.   Since her medications have been adjusted, she is really done quite well  over the last 2 months.  She currently has no symptoms.  She  specifically denies chest pain, dyspnea, orthopnea, or PND.  She is  really enjoying living at [facility] and is active  with friends  there.  She plays Bingo three times a week.  She has no other  complaints.   MEDICATIONS:  1. Coumadin as directed.  2. Multivitamin daily.  3. Atacand 2 mg daily.  4. Amiodarone 200 mg daily.  5. Lasix 40 mg daily.  6. Zocor 20 mg at bedtime.  7. Digoxin 0.125 mg daily.   ALLERGIES:  NKDA.   PHYSICAL EXAMINATION:  GENERAL:  On exam she is alert and oriented,  elderly woman with a bright affect.  VITAL SIGNS:  Weight is 99 pounds.  Blood pressure 110/54, heart rate  72, respiratory rate 16.  HEENT:  Normal.  NECK:  Normal carotid upstrokes without bruits.  Jugular venous pressure  normal.  LUNGS:  Clear bilaterally.  HEART:  Regular rate and rhythm. The apex is diffuse.  ABDOMEN:  Soft, nontender.  No organomegaly.  No bruits.  EXTREMITIES:  No clubbing, cyanosis or edema.  Peripheral pulses 2+ and  equal throughout.   ASSESSMENT:  1. New York Heart Association Class II congestive heart failure      secondary to severe underlying non-ischemic cardiomyopathy.  Mrs.      Arellano is doing very well.  I would  continue her current      therapy without changes.  She really has not tolerated much medical      therapy.  Clinically, this the best I have seen her and I think      will continue with her modest therapy which includes an ARB Lasix      and low dose digoxin.  2. Status post biventricular ICD.  3. Paroxysmal atrial fibrillation.  Continue amiodarone and Coumadin      for anticoagulation.   For follow-up,  I would like to see Mary Arellano back in 2 months.  She continues to follow closely with Dr. Lovell Sheehan as well.     Mary Arellano. Excell Seltzer, MD  Electronically Signed    MDC/MedQ  DD: 07/07/2007  DT: 07/09/2007  Job #: [id number]   cc:   Stacie Glaze, MD

## 2010-10-13 NOTE — Discharge Summary (Signed)
NAME:  Mary Arellano, Mary Arellano NO.:  [id number]   MEDICAL RECORD NO.:  [id number]          PATIENT TYPE:  INP   LOCATION:  4713                         FACILITY:  [facility]   PHYSICIAN:  Veverly Fells. Excell Seltzer, MD  DATE OF BIRTH:  [birth year]   DATE OF ADMISSION:  03/02/2007  DATE OF DISCHARGE:  03/15/2007                               DISCHARGE SUMMARY   PRIMARY CARDIOLOGIST:  Dr. Tonny Bollman   PRIMARY CARE PHYSICIAN:  [facility] office physician, Dr. Gershon Crane   PROCEDURES PERFORMED DURING HOSPITALIZATION:  Right heart cath with  milrinone challenge dated March 09, 2007:  Results revealing low  cardiac output with negative response to milrinone; low right heart  pressures; fluid load; increase ACE as tolerated per Dr. Excell Seltzer.   FINAL DISCHARGE DIAGNOSES:  1. Acute-on-chronic systolic heart failure, New York Heart Association      class III.  2. Paroxysmal atrial fibrillation.   SECONDARY DIAGNOSES:  1. Status post mitral valve repair.  2. Severe nonischemic cardiomyopathy with an ejection fraction of 15%.   HOSPITAL COURSE:  This is a very pleasant, 77 year old Caucasian female  who is well known to Dr. Excell Seltzer, who was actually seen in the office  prior to admission by Wende Bushy, PA.  Unfortunately, the patient  continued to have continued dyspnea on exertion, orthopnea and lower  extremity edema.  Ms. Noland Fordyce felt that she should be admitted for  inpatient management with IV diuretics.   The patient was admitted with a diagnoses of acute-on-chronic systolic  heart failure refractory to outpatient management.  The patient was seen  and examined by Dr. Excell Seltzer and Joellyn Rued, PA, on arrival.  The  patient had marked JVD 8 cm noted at 45-degree angle; she also had  crackles in her base and was complaining of coughing and pain with deep  inspiration.  She did have bilateral 2+ edema, right greater than left.  The patient did not have evidence of clubbing or  cyanosis at that time  and good pulses were palpated.  On arrival, the patient's BNP was 4956  on admission.  The patient was started on IV diuretics with good result.  The patient had aldactone and digoxin added during hospital course to  assist in diuresis and better cardiac output.  The patient diuresed  approximately 2000 ml a day during the initial part of her admission.  The patient's blood pressure remained low-normal in the low 100s at the  beginning of her admission and this was followed closely to avoid over  diuresing.  The patient's appetite began to improve, as she was unable  to eat prior to coming in secondary to symptoms.   Approximately three days into the admission, the patient began to feel  some better.  The patient had diuresed again approximately 1500 ml down  from 2000 ml a day.  The patient's blood pressure remained stable in the  low 100s.  The patient's BUN and creatinine were followed closely as  well as potassium.  The patient's edema had resolved, but BNP remained  elevated.  The patient Lasix and ARB were held secondary to mild  hypotension and case manager was requested to assist with home health.   Over the next several days, chest x-rays were also completed.  The  patient did have pleural effusions on  admission which resolved on  fourth day of admission.  The patient did have some mild continuing  hypotension and her BNP was starting to come down.  The patient had  slow, but steady improvement throughout hospitalization.  The patient  was seen by Dr. Gala Romney and Dr. Antoine Poche over the weekend prior to  discharge and medications were adjusted.  Recommendations from Dr.  Gala Romney did suggest a trial of milrinone at home to assist with  cardiac output; he discussed this with the patient and daughter with  risks and benefits of this treatment and a milrinone challenge was  completed on March 09, 2007.  Patient on March 09, 2007 had a right  cath with  milrinone secondary to severe systolic heart failure.  Please  see detailed dictation of Dr. Excell Seltzer concerning the heart pressures,  etc.  It was found that the patient had a low cardiac output with  negative response to milrinone, low right heart pressures.  The patient  was treated with a fluid load and increase in ACE inhibitor as  tolerated.   The patient was monitored on Coumadin therapy throughout hospitalization  and INR remained therapeutic.   On March 10, 2007, the patient had steady improvement at that time.  Discussion for assisted-living placement was had with social worker and  daughter.  Daughter did find an assisted-living facility that she was  pleased with and reviewed it with her mother and it will be [facility] here in [place].  They were awaiting bed placement at that time  and was to be made available on March 15, 2007.  In the interim, the  patient was started back on Atacand and aldactone; her beta-blocker was  reduced to 25 mg a day which is a long-acting formula and restarted on a  low-dose Lasix.   The patient was followed closely over the following weekend by Dr. Myrtis Ser  at which time the patient was found to have some mild hyperkalemia with  a potassium of 5.0; her potassium dose was adjusted.   On day of discharge, the patient was seen and examined by Dr. Calton Dach and found to be stable.  Lab work remained stable.  Vital signs  were also stable for her with low-normal blood pressures 82 to 106  systolic.  The patient was anxious to be discharged and was happy to be  moving to [facility].  The patient will have a  followup appointment with Dr. Excell Seltzer and continue PT/INR with the  [facility] office for continued management of her Coumadin therapy.  The  patient will also have a followup B-Met in two weeks prior to seeing Dr.  Excell Seltzer.   DISCHARGE LABS:  Sodium 134, potassium 4.8, chloride 101, CO2 29, BUN  24, creatinine  0.8, glucose 84.  Dig level 1.2.  PT 24.4, INR 2.1.  CBC:  Hemoglobin 14.3, hematocrit 42.8, white blood cells 5.6, platelets 124.   Chest x-ray dated March 06, 2007 reveals resolution of small effusion;  stable cardiomegaly with pacemaker.   Most recent echo dated January 04, 2007 revealing left ventricle was  moderately to markedly dilated; overall left ventricular systolic  function was severely reduced; left ventricular ejection fraction was  estimated to be 15%; the mitral prosthesis was working well; mean  transmitral  gradient was 3 mmHg; mitral valve area by pressure half-time  was 3.38 cm2; the left atrium was dilated; there was moderate tricuspid  valvular regurg; the right atrium was dilated per Dr. Myrtis Ser.   DISCHARGE MEDICATIONS:  1. Zocor 20 mg at bedtime.  2. Multivitamin daily with minerals.  3. Amiodarone 200 mg daily.  4. Spironolactone  12.5 mg daily.  5. Digoxin 0.125 mg daily.  6. Coumadin per Coumadin clinic recommendations; current dose 5 mg      with a PT/INR as listed above.  7. Atacand 4 mg daily.  8. Metoprolol XL 25 mg daily.  9. Potassium 10 mEq daily.  10.Lasix 40 mg daily.   ALLERGIES:  No known drug allergies and no contrast allergies.   FOLLOWUP PLANS AND APPOINTMENTS:  1. The patient will follow up with Dr. Tonny Bollman on November 10      at 11:45 p.m.  2. The patient is scheduled to have a B-Met drawn on October 27 for      continued management of kidney function and potassium level.  3. The patient will follow up with Dr. Clent Ridges, her primary care      physician, for continued medical management.  4. The patient has been given our phone number for any questions or      problems associated when she returns to assisted-living.  5. The patient has also been given heart failure management      instructions with emphasis on daily weight.  If she gains two to      three pounds overnight, she is to call for further instructions      with our phone  number provided.   Time spent with the patient to include physician time:  45 minutes.      Bettey Mare. Lyman Bishop, NP      Veverly Fells. Excell Seltzer, MD  Electronically Signed    KML/MEDQ  D:  03/15/2007  T:  03/15/2007  Job:  [id number]   cc:   Tera Mater. Clent Ridges, MD  [facility] [place]

## 2010-10-13 NOTE — Assessment & Plan Note (Signed)
[facility]                            CARDIOLOGY OFFICE NOTE   CEIRA, HOESCHEN                 MRN:          [id number]  DATE:07/02/2008                            DOB:          01-24-1927    REASON FOR VISIT:  Chronic heart failure.   HISTORY OF PRESENT ILLNESS:  [name] is a delightful 77 year old  woman with severe nonischemic cardiomyopathy and LVEF in the range of  20%.  She has been stable over the past year and has not had problems  with progressive heart failure symptoms.  She has had an episode of  syncope that occurred in the fall and the etiology of this was unclear.  Her ICD did not demonstrate any ventricular arrhythmias.  She feels well  at present.  She has some generalized fatigue and takes naps on a daily  basis.  She denies edema, orthopnea, PND, chest pain, lightheadedness,  or recurrent syncope.  She is treated with Coumadin for paroxysmal  atrial fibrillation and has had no palpitations or other complaints  related to that.  She feels that her gait is steady and she has had no  other episodes of falls.   CURRENT MEDICATIONS:  1. Coumadin as directed.  2. Multivitamin one daily.  3. Amiodarone 200 mg daily.  4. Lasix 40 mg daily.  5. Fish oil 1 g 2 twice daily.  6. Digoxin 0.125 mg every other day.  7. Atacand 2 mg daily.   ALLERGIES:  NKDA.   PHYSICAL EXAMINATION:  GENERAL:  She is alert and oriented elderly  woman, in no acute distress.  VITAL SIGNS:  Weight is 98 pounds this up from 92 pounds in November.  Blood pressure 118/52, heart rate 64, and respiratory rate 16.  HEENT:  Normal.  NECK:  Normal carotid upstrokes.  No bruits.  JVP normal.  LUNGS:  Clear bilaterally.  HEART:  Regular rate and rhythm.  No murmurs or gallops.  ABDOMEN:  Soft and nontender.  No organomegaly.  EXTREMITIES:  No clubbing, cyanosis, or edema.  Peripheral pulses intact  and equal.  SKIN:  Warm and dry without rash.   ASSESSMENT:  1. Severe nonischemic cardiomyopathy.  Continue current medical      regimen.  No evidence of heart failure on exam.  She has been      unable to tolerate higher doses of her medications because of      hypotension.  2. Paroxysmal atrial fibrillation.  She remains in sinus rhythm on      amiodarone.  We will continue Coumadin.  She has a single chamber      implantable cardioverter-defibrillator, so we cannot determine her      atrial fibrillation burden.  With her depressed ejection fraction,      I think it is best to keep her anticoagulated because of      concomitant stroke risk.  3. Amiodarone monitoring:  Thyroid studies were within normal range on      check on April 17, 2008.  We will check LFTs and thyroid studies      at her followup visit in  3-4 months.     Veverly Fells. Excell Seltzer, MD  Electronically Signed    MDC/MedQ  DD: 07/02/2008  DT: 07/03/2008  Job #: [id number]   cc:   Stacie Glaze, MD

## 2010-10-13 NOTE — Assessment & Plan Note (Signed)
[facility]                            CARDIOLOGY OFFICE NOTE   Mary Arellano, Mary Arellano                 MRN:          [id number]  DATE:03/27/2008                            DOB:          1926/07/21    REASON FOR VISIT:  Followup congestive heart failure.   HISTORY OF PRESENT ILLNESS:  [name] is a 77 year old woman with  severe nonischemic cardiomyopathy, chronic atrial fibrillation, and  history of mitral valve disease status post mitral valve replacement.  She has chronic heart failure symptoms, manifest mainly as generalized  fatigue.  She denies orthopnea, PND, or lower extremity edema.  She has  chronic exertional dyspnea with moderate level activity.  She remains  New York Heart Association class II to III.  She denies chest pain,  lightheadedness, or syncope.  She does have mild gait instability.   MEDICATIONS:  1. Coumadin as directed.  2. Multivitamin 1 daily.  3. Atacand 2 mg daily.  4. Amiodarone 200 mg daily.  5. Lasix 40 mg daily.  6. Digoxin 0.125 mg daily.  7. Fish oil 1 g 2 b.i.d.   ALLERGIES:  NKDA.   PHYSICAL EXAMINATION:  GENERAL:  The patient is alert and oriented.  No  acute distress.  VITAL SIGNS:  Weight 94 pounds, blood pressure 120/50, heart rate 64,  respiratory rate 16.  HEENT:  Normal.  NECK:  Normal carotid upstrokes.  No bruits.  JVP normal.  LUNGS:  Clear bilaterally.  HEART:  Regular rate and rhythm.  No murmurs or gallops.  ABDOMEN:  Soft, nontender.  No organomegaly.  EXTREMITIES:  No clubbing, cyanosis, or edema.  Peripheral pulses intact  and equal.   EKG shows normal sinus rhythm with LVH and repolarization abnormality  present.   ASSESSMENT:  1. Chronic systolic heart failure secondary to severe underlying      nonischemic cardiomyopathy.  [name] remains stable.  She      has no congestive symptoms.  Continue current medical program as      outlined.  She has been intolerant to  escalating medical therapies      due to hypotension.  2. Paroxysmal atrial fibrillation.  Continue Coumadin.  She is      maintaining sinus rhythm on low-dose amiodarone.  3. Status post implantable cardioverter-defibrillator.  No discharges.      Followup per EP.   I would like to see [name] back in 3 months.  Prescription was  written for her to have a dig level and TSH at her next lab draw at Dr.  Lovell Sheehan' office.  These are for serial followup while she is on  amiodarone.     Veverly Fells. Excell Seltzer, MD  Electronically Signed    MDC/MedQ  DD: 04/01/2008  DT: 04/01/2008  Job #: [id number]   cc:   Stacie Glaze, MD

## 2010-10-13 NOTE — H&P (Signed)
NAME:  Mary Arellano, LUMB NO.:  [id number]   MEDICAL RECORD NO.:  [id number]          PATIENT TYPE:  INP   LOCATION:  4713                         FACILITY:  [facility]   PHYSICIAN:  Veverly Fells. Excell Seltzer, MD  DATE OF BIRTH:  09/10/1926   DATE OF ADMISSION:  03/02/2007  DATE OF DISCHARGE:                              HISTORY & PHYSICAL   HISTORY:  Ms. Fulginiti is a 77 year old white female who is well-known  to Dr. Earmon [facility] practice. She was seen in the office yesterday by  Wende Bushy,  PA-C for increasing congestive heart failure symptoms.  Her diuretics were increased however, her blood work that was drawn in  the office shows a BNP of 49-56. Given her history of heart failure in  the past has been refractory to outpatient medications and her elevated  BNP, it was felt that she should be admitted for inpatient management  with IV diuretics. The patient continues to have dyspnea on exertion,  orthopnea and lower extremity edema.  She also states that she received  a prescription for TED hose however, she only received one hose and she  had planned to call the office today to further evaluate this. It is  unclear how much weight she has dropped within the last 24 hours.  Given  her recent increase of her Lasix to 80 mg b.i.d. and potassium 40 mEq  b.i.d.   PAST MEDICAL HISTORY:  No known drug allergies.   MEDICATIONS PRIOR TO ADMISSION:  1. Pravachol 40 mg q.h.s.  2. Coumadin 2.5 mg daily regulated by her primary care physician.  3. Multivitamin daily.  4. Metoprolol 25 mg b.i.d.  5. Atacand 4 mg daily.  6. Amiodarone 200 mg daily.  7. Lasix 80 mg b.i.d.  8. Potassium 40 mEq b.i.d.  9. Benadryl daily.   PAST MEDICAL HISTORY:  Notable for a nonischemic cardiomyopathy with  class 2-3 heart failure symptoms.  Her last echocardiogram on January 04, 2007 showed ejection fraction of 15%. This also showed prosthetic valve  was working well, left atrial and right atrial  enlargement, moderate TR.  She has a history of atrial fibrillation currently maintaining normal  sinus rhythm with anticoagulation regulated by her primary care  physician.  Hyperlipidemia, history of mitral valve disease status post  mitral valve replacement with pericardial tissue valve in 2006.  Status  post Guidant ICD on January 23, 2007 by Dr. Sharrell Ku.   FAMILY HISTORY AND SOCIAL HISTORY:  Essentially remained unchanged from  his previous H&P in August.   REVIEW OF SYSTEMS:  As above.   PHYSICAL EXAM:  GENERAL:  Well-nourished, well-developed, but frail-  appearing female in mild respiratory distress.  She cannot tolerate  lying flat in the emergency room, daughter is present.  VITAL SIGNS:  Temperature is 97, blood pressure 91/55, pulse 78 and  regular, respirations 18 and regular, 98% sat on room air.  HEENT:  Unremarkable.  NECK:  Supple without thyromegaly, adenopathy or carotid bruits.  She  does have approximately 8 cm JVD at 45 degrees.  LUNGS:  Symmetrical excursion.  She does have a few crackles  in the  bases but essentially clear to auscultation; however, she developed  coughing with deep inspiration.  HEART:  PMI is enlarged. She has a regular rate and rhythm with a normal  S1 and S2.  She does have an S4 with a 2/6 systolic murmur best  appreciated in the left sternal border.  ABDOMEN:  Soft, bowel sounds present without organomegaly, masses or  tenderness.  EXTREMITIES:  2+ bilateral pitting edema extending to above her calves.  On her left lower extremity she does have a TED hose that has reduced  some of the edema. Thus her right lower extremity is slightly worse than  her left lower extremity. Peripheral pulses are symmetrical and intact.   IMPRESSION:  1. Acute on chronic systolic congestive heart failure refractory to      outpatient management with a BNP yesterday of 4956.  2. History of dilated cardiomyopathy with a known ejection fraction of       15% by echocardiogram on July 06, 2006.  History as noted above.   DISPOSITION:  I have reviewed the patient's indications for being  admitted to the hospital, the patient's history and exam with Dr.  Excell Seltzer. He will admit the patient for IV diuresis and further medical  management of her heart failure.  We will check our usual labs and  follow potassium closely.      Joellyn Rued, PA-C      Veverly Fells. Excell Seltzer, MD  Electronically Signed    EW/MEDQ  D:  03/02/2007  T:  03/03/2007  Job:  [id number]   cc:   Tera Mater. Clent Ridges, MD

## 2010-10-13 NOTE — Cardiovascular Report (Signed)
NAME:  Mary Arellano, CROSTON NO.:  [id number]   MEDICAL RECORD NO.:  [id number]          PATIENT TYPE:  INP   LOCATION:  4713                         FACILITY:  [facility]   PHYSICIAN:  Veverly Fells. Excell Seltzer, MD  DATE OF BIRTH:  [birth year]   DATE OF PROCEDURE:  DATE OF DISCHARGE:                            CARDIAC CATHETERIZATION   PROCEDURE:  Right heart catheterization with milrinone challenge.   INDICATIONS:  Ms. [name] is a 77 year old woman with advanced  systolic heart failure.  She has an LV ejection fraction of 15%.  She is  not tolerating medical therapy due to hypotension.  She was brought for  right heart catheterization with milrinone challenge to evaluate her for  potential home milrinone therapy.   Risks and indications of the procedure were reviewed with the patient,  informed consent was obtained.  The right groin was prepped, draped, and  anesthetized with 1% lidocaine.  Using the modified Seldinger technique  a 7-French sheath was placed in the right femoral vein.  A Swan-Ganz  catheter was used to measure baseline right heart hemodynamics as  pressures were measured from the right atrium to the pulmonary capillary  wedge position.  Pulmonary artery saturations were drawn.  Aortic  saturations were based on noninvasive pulse oximetry.  Fick cardiac  index and output were calculated.  Thermodilution cardiac output and  index were calculated.  At that point, milrinone was started at 0.25  mcg/kg per minute.  Hemodynamics were measured with graded increases of  milrinone at 0.25 mcg, 0.375 mcg, 0.5 mcg, and finally 0.75 mcg/kg per  minute.  The patient tolerated the procedure well and there are no  immediate complications.   FINDINGS:  Baseline hemodynamics:  Right atrial mean pressure 1, right  ventricular pressure 24/2, pulmonary artery pressure 24/8 with a mean of  14, pulmonary capillary wedge pressure mean of 7.  Oxygen saturation:  Aorta 96%,  pulmonary artery 68%.   Fick cardiac output is 1.9 L per minute.  Cardiac index 1.4 L per minute  per meters squared.   Thermodilution cardiac output at baseline is 2.3 L per minute.  Cardiac  index 1.7 L per minute per meters squared.  There was minimal change  with milrinone.   Final milrinone hemodynamics at a dose of 0.75 mcg/kg per minute were as  follows:  Pulmonary artery pressure 31/11 with a mean of 18, pulmonary  capillary wedge pressure mean of 10.  Cardiac output by the Fick method  2.1.  Cardiac index by the Fick method 1.5 L per minute per meters  squared.  By thermodilution the cardiac output was 2.6 L per minute.  The cardiac index was 1.9 L per minute per meters squared by  thermodilution.   ASSESSMENT:  1. Low cardiac output with a negative response to milrinone.  2. Low right heart pressures.   DISCUSSION:  Ms. [name] has low cardiac output as well as low  pressures throughout her right heart chambers with a low wedge pressure  of 7.  She appears to be volume deplete and will carefully give her  normal saline over the  next 12-24  hours.  Will increase her ACE inhibitor as she tolerates.  It  does not appear the she will gain benefit from milrinone as she did not  achieve a 20% increase in her cardiac index nor did she achieve a  decrease in her pulmonary capillary wedge pressure.      Veverly Fells. Excell Seltzer, MD  Electronically Signed     MDC/MEDQ  D:  03/09/2007  T:  03/10/2007  Job:  [id number]

## 2010-10-13 NOTE — Assessment & Plan Note (Signed)
[facility]                         ELECTROPHYSIOLOGY OFFICE NOTE   MESA, JANUS                   MRN:          [id number]  DATE:01/09/2007                            DOB:          09/20/1926    REFERRING PHYSICIAN:  Veverly Fells. Excell Seltzer, MD   Ms. Gren is referred today by Dr. Calton Dach for consideration  for evaluation of ICD implantation.   HISTORY OF PRESENT ILLNESS:  The patient is a 77 year old woman with a  history of nonischemic cardiomyopathy with normal coronary arteries.  Her EF is 15%.  She has diffuse hypokinesis, moderate RV dysfunction and  moderate tricuspid regurgitation.  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  She had postoperative atrial fibrillation  initially which has resolved.  She has been on chronic Coumadin therapy  secondary to all of the above.  She has dyslipidemia and a remote  history of hemorrhoid surgery.  The patient's heart failure symptoms are  class II.  She goes to cardiac rehab several days a week and her  exercise tolerance has been quite good.  She denies syncope.  She denies  rapid palpitations.  She has not felt any atrial fibrillation symptoms  since her heart surgery.   MEDICATIONS:  1. Coumadin as directed.  2. Multivitamin.  3. Toprol ER 50 a day.  4. Lisinopril 5 a day.  5. Pravachol 40 a day.   FAMILY HISTORY:  Noncontributory.   SOCIAL HISTORY:  Negative for tobacco or ethanol abuse.   REVIEW OF SYSTEMS:  Negative except as noted in the HPI.   PHYSICAL EXAMINATION:  VITAL SIGNS:  Blood pressure 106/70, pulse 90 and  regular, respirations 16, weight 104 pounds.  GENERAL APPEARANCE:  She is a pleasant, well-appearing, elderly woman in  no distress.  HEENT:  Normocephalic and atraumatic.  Pupils equal and round.  Oropharynx moist.  Sclerae are anicteric.  NECK:  There is 7 cm jugular venous distension, no thyromegaly.  Trachea  midline.  Carotids are 2+ and symmetric.  LUNGS:  Clear to auscultation bilaterally.  No wheezing, rhonchi or  rales are present.  CARDIOVASCULAR:  Regular rate and rhythm with normal S1 and S2.  There  was a soft systolic murmur at the left lower sternal border.  There was  a soft S3 gallop present.  PMI was enlarged and laterally displaced.  ABDOMEN:  Soft, nontender, nondistended.  There is no organomegaly,  bowel sounds are present, no rebound or guarding.  EXTREMITIES:  No clubbing, cyanosis, or edema.  Pulses are 2+ and  symmetric.  NEUROLOGIC:  Alert and oriented x3.  Cranial nerves are intact.  Strength is 5/5 and symmetric.   EKG demonstrates sinus rhythm with QRS duration of 110 msec.   IMPRESSION:  1. Nonischemic cardiomyopathy.  2. Class II congestive heart failure.  3. Mitral valve disease, status post mitral valve replacement.   DISCUSSION:  Overall, Ms. Finau is stable but she is at risk for  sudden cardiac  death and is a candidate for prophylactic ICD insertion  based on the (SCD-HEFT) criteria.  The risks, benefits, goals and  expectations of the procedure have been discussed with the patient and  she would like to proceed.  This will be scheduled at the earliest  possible convenient time.     Doylene Canning. Ladona Ridgel, MD  Electronically Signed    GWT/MedQ  DD: 01/09/2007  DT: 01/10/2007  Job #: [id number]   cc:   Jeannett Senior A. Clent Ridges, MD

## 2010-10-13 NOTE — Op Note (Signed)
NAME:  Mary Arellano, Mary Arellano NO.:  [id number]   MEDICAL RECORD NO.:  [id number]          PATIENT TYPE:  INP   LOCATION:  6525                         FACILITY:  [facility]   PHYSICIAN:  Doylene Canning. Ladona Ridgel, MD    DATE OF BIRTH:  [birth year]   DATE OF PROCEDURE:  01/23/2007  DATE OF DISCHARGE:                               OPERATIVE REPORT   PROCEDURE PERFORMED:  Implantation of a single chamber ICD.   INDICATIONS:  Nonischemic cardiomyopathy, EF 15%, class 2 heart failure,  longstanding despite maximal medical therapy.   I. INTRODUCTION:  The patient is a very pleasant 77 year old woman with  a history of congestive heart failure who has been on beta blockers and  ARBs for several years now.  She has severe LV dysfunction with an EF of  15%.  She had diffuse hypokinesis by echo.  She is now referred for ICD  implantation.   II. PROCEDURE PERFORMED:  Informed consent was obtained.  The patient  was taken to the diagnostic EP lab in the fasting state.  After the  usual preparation and draping, intravenous fentanyl and metaxalone were  given for sedation.  Then 10 mL contrast was injected into the left  upper extremity venous system demonstrating a patent left subclavian  vein.  It was subsequently punctured and the Guidant model [id number]  active fixation defibrillation lead was subsequently advanced by way of  the subclavian vein into the right ventricle.  Mapping was carried out  at the final site.  The R-waves measured 20 mV the pacing impedance of  the lead actively fixed was 686 ohms, with a threshold of 1 V at 0.5  milliseconds.  A 10 volt pacing did not stimulate the diaphragm.  With  these satisfactory parameters, the lead was secured to subpectoralis  fascia with a figure-of-eight silk suture; and the sewing sleeve also  secured with a silk suture.   Electrocautery was then utilized to make a subcutaneous pocket.  Kanamycin irrigation was utilized to irrigate the  pocket and  electrocautery was utilized to assure hemostasis.  At this point the  Guidant vitality II ICD model 2175 serial number [id number] was connected to  the defibrillation lead and placed back in the subcutaneous pocket.  Generator was secured with a silk suture.  Additional kanamycin was then  utilized to irrigate the pocket; and defibrillation threshold testing  carried out.   After the patient was more deeply sedated with fentanyl and Versed, VF  was induced with a T wave shock and a 14 joules shock was delivered  terminating VF and restoring sinus rhythm.  At this point 5 minutes was  allowed to elapse and a second defibrillation threshold test was carried  out.  Again, VF was induced with a T wave shock; and again a 14 joules  shock was delivered which terminated VF and restored sinus rhythm.  At  this point, no additional defibrillation threshold testing was carried  out; and the incision closed with a layer of 2-0 Vicryl followed by a  layer of 3-0 Vicryl.  Benzoin was painted on the skin.  Steri-Strips  were  applied; and a pressure dressing was placed; and the patient was  returned to her room in satisfactory condition.   III. COMPLICATIONS:  There were no immediate procedure complications.   IV. RESULTS:  This demonstrate successful implantation of a Guidant  single chamber defibrillator in a patient with a nonischemic  cardiomyopathy class 3 heart failure, and an EF of 15%.      Doylene Canning. Ladona Ridgel, MD  Electronically Signed     GWT/MEDQ  D:  01/23/2007  T:  01/23/2007  Job:  [id number]   cc:   Veverly Fells. Excell Seltzer, MD  Tera Mater. Clent Ridges, MD

## 2010-10-13 NOTE — Discharge Summary (Signed)
NAME:  Mary Arellano, Mary Arellano          ACCOUNT NO.:  [id number]   MEDICAL RECORD NO.:  [id number]          PATIENT TYPE:  INP   LOCATION:  6708                         FACILITY:  [facility]   PHYSICIAN:  Valerie A. Felicity Coyer, MDDATE OF BIRTH:  [birth year]   DATE OF ADMISSION:  04/28/2007  DATE OF DISCHARGE:  05/03/2007                               DISCHARGE SUMMARY   DISCHARGE DIAGNOSES:  1. Supratherapeutic INR.  2. Acute renal insufficiency in setting of nausea and vomiting,      resolved.  3. Chronic systolic heart failure, currently clinically compensated.  4. Paroxysmal atrial fibrillation, metoprolol discontinued secondary      to hypotension during this admission.  5. Dyslipidemia, on Zocor.  6. Escherichia coli urinary tract infection.   HISTORY OF PRESENT ILLNESS:  Ms. Vath is a 77 year old female who  was admitted on April 28, 2007, with a chief complaint of nausea and  vomiting which had been present for 6 days prior to admission.  She saw  her primary care Euell Schiff earlier on the day of admission and was told  to come to the emergency room due to reportedly elevated potassium of  greater than 6.  She was also noted to have an elevated INR of 8.7.  She  was admitted for further evaluation and treatment.   PAST MEDICAL HISTORY:  1. Stage III CHF.  2. Next paroxysmal atrial fibrillation.  3. Dyslipidemia.   COURSE OF HOSPITALIZATION:  Problem 1.  SUPRATHERAPEUTIC INR:  A CT of the head was performed on  admission to rule out any intracranial bleeds in setting of  supratherapeutic INR.  CT of head showed no acute abnormality.  The  patient's Coumadin was held.  Her INR at time of discharge is stable  with a value of 2.1.   Problem 2.  ACUTE RENAL FAILURE:  The patient was noted to be  hyponatremic with an elevated creatinine of 1.8.  She was given IV  hydration and her creatinine trended downward.  Creatinine at time of  discharge is stable at 0.8.   Problem 3.   NAUSEA AND VOMITING:  This was likely acute viral gastritis.  She is currently tolerating p.o.'s without difficulty.   Problem 4.  CHRONIC SYSTOLIC HEART FAILURE:  The patient had a left  ventricular ejection fraction of 15% in August 2008.  The patient was  seen in consultation by [facility] during this admission,  initially by Dr. [place] Bing.  Her heart failure is currently  compensated.  Also of note, the patient was noted to have an elevated  digoxin level during this admission with a value of 3.0.  digoxin was  held.  Follow-up digoxin level was 1.8.  She will be placed on a lower  dose of digoxin at time of discharge.   Problem 5.  ESCHERICHIA COLI URINARY TRACT INFECTION:  The patient was  noted to have a small amount of E. coli in her urine.  She also had some  sundowning events and apparently some confusion earlier in this  admission, although and she is currently stable.  We will treat her with  a brief  course of oral Cipro.   PHYSICAL EXAM:  BP 96/42, heart rate 59, respiratory rate 20,  temperature 98.3, O2 saturation 97% on room air.  HEENT: The patient is noted to have a right orbital ecchymosis.  GENERAL:  She is an awake, elderly, thin female in no acute distress.  CARDIOVASCULAR:  S1, S2, regular rate and rhythm is noted.  LUNGS:  Clear to auscultation bilaterally without rales, wheezes or  rhonchi.  ABDOMEN:  Soft, nontender, nondistended.  EXTREMITIES:  Note trace bilateral lower extremity edema.  NEUROLOGIC:  The patient is awake and alert, moving all extremities.  Speech is clear.  She answered questions appropriately.   MEDICATIONS AT TIME OF DISCHARGE:  1. Multivitamin 1 tablet p.o. daily.  2. Amiodarone 200 mg p.o. daily.  3. Digoxin 0.125 mg p.o. daily.  4. Coumadin 2.5 mg p.o. daily for 3 days, then 5 mg p.o. on the fourth      day as before.  5. Zocor 20 mg p.o. daily.  6. Lasix 40 mg p.o. daily.  7. Next Aldactone 12.5 mg p.o. daily.  8.  Atacand 2 mg p.o. daily.  9. Cipro 250 mg p.o. b.i.d. x3 days.   PERTINENT LABORATORIES AT TIME OF DISCHARGE:  INR 2.1.  Digoxin 1.8.  BUN 19, creatinine 0.8, potassium 4.1.   DISPOSITION:  The patient will be discharged to home to her assisted  living facility.   FOLLOW-UP:  The patient is to follow up with Dr. Darryll Capers on  December 18 at 11 a.m.  She is also to follow up with Dr. Tonny Bollman  and contact the office for an appointment.      Sandford Craze, NP      Raenette Rover. Felicity Coyer, MD  Electronically Signed    MO/MEDQ  D:  05/03/2007  T:  05/03/2007  Job:  [id number]   cc:   Veverly Fells. Excell Seltzer, MD  Stacie Glaze, MD

## 2010-10-13 NOTE — Assessment & Plan Note (Signed)
[facility]                            CARDIOLOGY OFFICE NOTE   ADISYN, RUSCITTI                 MRN:          [id number]  DATE:04/17/2008                            DOB:          April 09, 1927    PRIMARY CARDIOLOGIST:  Veverly Fells. Excell Seltzer, MD   PRIMARY CARE PHYSICIAN:  Stacie Glaze, MD   HISTORY OF PRESENT ILLNESS:  This is a 77 year old with history of  nonischemic cardiomyopathy, paroxysmal atrial fibrillation and ICD  placement who presents after an episode of apparent syncope.  This  episode occurred on April 15, 2008.  She stated that she was at her  assisted living, sitting in a chair doing arm and leg exercises (her  usual calisthenics).  She does not remember the actual event, and all  she remembers is that she was doing calisthenics and then she was in an  ambulance.  Her son-in-law who was there at the assisted living with her  at that time states that she complained about feeling hot and then she  appeared to slump in her chair, her eyes were opened throughout the  event; however, she was not responsive.  Apparently, the event lasted  more than a minute to less than 5 minutes.  She says this kind of  episode has not happened to her before.  EMS was summoned and by the  time EMS had arrived, she was fully awake and alert and her blood  pressure was noted to be nonhypotensive.  The patient was taken to the  emergency department.  She had a CT of her head done which showed no  acute event.  Her digoxin level was 1.3.  The patient has no history of  seizures.  In general the patient is stable clinically today.  She gets  short of breath climbing up a flight of steps; however, she can walk on  flat ground without assistance with no shortness of breath.  She has had  one fall recently.  She says it was about a week ago.  She was going out  to play cards and she tripped and appears to be a mechanical fall, there  was no injury.   PAST MEDICAL HISTORY:  1. Nonischemic cardiomyopathy.  Echocardiogram in August 2009, showed      marked LV dilation, EF 15%, RV normal size and function, prosthetic      mitral valve was functioning normally.  There was moderate      tricuspid regurgitation.  2. Paroxysmal atrial fibrillation.  The patient is on amiodarone to      maintain NSR and she is on Coumadin.  3. Congestive heart failure New York Heart Association class II to      III.  4. Status post mitral valve replacement.  5. Status post placement of a Guidant single chamber ICD.   MEDICATIONS:  1. Coumadin.  2. Candesartan 2 mg daily.  3. Amiodarone 200 mg daily.  4. Lasix 40 mg daily.  5. Digoxin 0.125 mg daily.  6. Fish oil.   Most recent labs were done on April 15, 2008.  TSH was mildly  elevated  at 4.708.  Digoxin level was 1.3, creatinine was 1.1.   PHYSICAL EXAMINATION:  VITAL SIGNS:  Blood pressure 113/64, heart rate  67 and regular, weight is 92 pounds today which is down from 94 pounds  prior.  GENERAL:  This is an elderly female in no apparent distress.  NEUROLOGIC:  Alert and oriented x3.  NECK:  There is no JVD, there is no thyromegaly or thyroid nodule.  CARDIOVASCULAR:  Heart regular S1 and S2.  There is no S3, no S4.  There  is a slight 1/6 holosystolic murmur on the left lower sternal border.  ABDOMEN:  Soft, nontender.  No hepatosplenomegaly.  LUNGS:  Clear to auscultation bilaterally with normal respiratory  effort.  EXTREMITIES:  No clubbing, cyanosis.  There is no edema.   ASSESSMENT AND PLAN:  This is a 81-year with history of nonischemic  cardiomyopathy and paroxysmal atrial fibrillation who presents after a  presumed syncopal episode on April 15, 2008.  1. Syncopal episode.  The patient slumped in her chair while doing      calisthenics in a chair.  Her eyes were open but she was not      responsive.  Her ICD was interrogated today.  There were no events      recorded by her ICD.   She had no ventricular pacing.  She had no      high rate events that were below the rate that would trigger ICD      therapies.  Really can find no evidence for an arrhythmic event as      the cause of this episode.  It is possible that she dropped her      blood pressure while maintaining her heart rate.  She was seated at      the time of this event so that I doubt it would be an orthostatic      event.  An episode of vasovagal syncope would be possible, but I do      not see that there was any trigger for this unless she has carotid      sinus hypersensitivity and somehow compressed her carotid sinus      while raising her arms.  The other consideration would be a      seizure; however, her head CT showed no acute event and it would be      unusual to present with a seizure disorder at the age of 82 and      have nothing on head imaging, so I think for now will continue to      monitor her and if she has another one of these events for which no      arrhythmic episode can be detected, it may be reasonable to refer      to see a neurologist.  Additionally, I am going to check a CHEM-7      today to make sure her electrolytes are stable.  2. The patient did have an elevated digoxin level of 1.3 on April 15, 2008, it was about 8 hours after her digoxin dose.  Given the      fact that she is a woman especially her goal digoxin level would be      less than 1.  I am going to go ahead and cut back on her digoxin to      0.125 mg every other day.  I will have her repeat digoxin level on  a day that she has not taken the medication.  If she does develop      increasing symptoms off the medication we can have her go back to      daily dosing.  3. Nonischemic cardiomyopathy.  The patient does appear close to      euvolemic today.  She has stable New York Heart Association class      II to III symptoms.  4. Elevated TSH.  The patient is on amiodarone currently.  We will      repeat  a TSH and check a free T4 to see if she is truly      hypothyroid.     Marca Ancona, MD  Electronically Signed    DM/MedQ  DD: 04/17/2008  DT: 04/18/2008  Job #: [id number]   cc:   Stacie Glaze, MD

## 2010-10-13 NOTE — Assessment & Plan Note (Signed)
[facility]                            CARDIOLOGY OFFICE NOTE   Mary [facility]llano, Mary Arellano                   MRN:          [id number]  DATE:04/10/2007                            DOB:          [birth year]    Mary Arellano returns for followup at the [facility] Cardiology office  on April 10, 2007. Mary Arellano is a delightful 77 year old woman  well known to me with severe non-ischemic cardiomyopathy and New [place]  Heart Association Class III congestive heart failure. She has had a  prolonged hospitalization for congestive heart failure back in October.  At that time, she underwent a right heart catheterization for  consideration of outpatient milrinone infusion. She had a negative  response and therefore this was not pursued any further. She has  responded remarkably well to her current medical therapy. She moved from  home to an assisted living facility at [facility]. She really likes  the environment there and is doing quite well.   From a symptomatic standpoint, she has generalized fatigue and stable  exertional dyspnea, but is getting along quite well overall. She denies  orthopnea, PND, edema, or lightheadedness or syncope. She has had no  chest pain.   CURRENT MEDICATIONS:  1. Coumadin as directed.  2. Multivitamin daily.  3. Metoprolol ER 25 mg daily.  4. Atacand 4 mg daily.  5. Amiodarone 200 mg daily.  6. Lasix 40 mg daily.  7. K-Dur 10 mEq daily.  8. Zocor 20 mg at bedtime.  9. Aldactone 12.5 mg daily.  10.Digoxin 25 mcg daily.   ALLERGIES:  No known drug allergies.   PHYSICAL EXAMINATION:  The patient is alert and oriented. She is in no  acute distress. She is a very thin, elderly woman. Weight is 93 pounds.  Blood pressure 104/54, heart rate 57, respiratory rate is 16.  HEENT: Is normal.  NECK: Normal carotid upstrokes without bruits. Jugular venous pressure  is normal.  LUNGS:  Clear to auscultation bilaterally.  HEART: Regular rate and rhythm with an S3 gallop.  ABDOMEN: Soft and nontender. No organomegaly.  EXTREMITIES: No clubbing, cyanosis or edema. Peripheral pulses are 2+  and equal throughout.   EKG shows normal sinus rhythm with marked LVH and associated  repolarization abnormality.   ASSESSMENT:  1. New York Heart Association Class III systolic heart failure      secondary to severe underlying non-ischemic cardiomyopathy. Will      continue current medical therapy. Overall, I am very pleased with      Mary Arellano's clinical progress. She appears to be thriving in      her new environment. I think her volume status is good at present      as she appears euvolemic. I did not make any changes to her medical      regimen today.  2. Paroxysmal atrial fibrillation. Continue amiodarone and Coumadin.      She will be due for her amiodarone monitoring with a TSH, LFTs and      metabolic panel in December.  3. Dyslipidemia. Will check lipids and LFTs at the time of her blood  work next month.   For followup, I would like to see Mary Arellano back in three months.     Veverly Fells. Excell Seltzer, MD  Electronically Signed    MDC/MedQ  DD: 04/10/2007  DT: 04/10/2007  Job #: [id number]   cc:   Tera Mater. Clent Ridges, MD

## 2010-10-13 NOTE — Assessment & Plan Note (Signed)
[facility]                            CARDIOLOGY OFFICE NOTE   JUHI, LAGRANGE                   MRN:          [id number]  DATE:01/12/2007                            DOB:          [birth year]    Mary Arellano returns for followup as an outpatient to the [facility]  Cardiology office on January 12, 2007.  She is a delightful, 77 year old  woman with severe nonischemic cardiomyopathy and New York Heart  Association Class II congestive heart failure.  Mary Arellano is stable  from a symptomatic standpoint.  She was seen on August 1, because of  increased shortness of breath.  The patient actually reported stable  symptoms, but her daughter thought her breathing was worse so she came  in for a visit at that time.  She had noticed an increased cough and her  lisinopril was changed to Atacand.  Her cough has improved and it was  likely ACE inhibitor induced.  At this point, she reports stable  exertional dyspnea.  She complains of fatigue and dyspnea with one  flight of stairs, but she is still able to do that level of activity.  She can perform daily housework without symptoms.  She goes to cardiac  rehabilitation three times weekly and is able to walk on the treadmill  at 2.3 miles per hour and pedal the recumbent bicycle.  She does not  have symptoms with that level of activity.   She has seen Dr. Ladona Ridgel in consultation and is planning on undergoing  prophylactic ICD placement for prevention of sudden cardiac death in the  setting of her severe nonischemic cardiomyopathy.  She is scheduled for  the end of this month.   CURRENT MEDICATIONS:  1. Pravachol 40 mg daily.  2. Coumadin as directed.  3. Multivitamin daily.  4. Long-acting metoprolol 25 mg two daily.  5. Atacand 4 mg daily.   ALLERGIES:  No known drug allergies.   PHYSICAL EXAMINATION:  GENERAL:  The patient is alert and oriented.  She  is no acute distress.  VITAL SIGNS:   Weight 102 pounds, blood pressure 108/62, heart rate 88,  respirations 16.  HEENT:  Normal.  NECK:  Normal carotid upstrokes without bruits.  Jugular venous pressure  normal.  LUNGS:  Clear to auscultation bilaterally.  HEART:  The apex is diffuse.  Regular rate and rhythm without murmurs,  rubs or gallops.  ABDOMEN:  Soft, nontender, no organomegaly.  EXTREMITIES:  No clubbing, cyanosis or edema.   ASSESSMENT:  1. New York Heart Association Class II congestive heart failure      secondary to severe nonischemic cardiomyopathy with recent      assessment of left ventricular function by 2-D echocardiogram and      left ventricular ejection fraction estimated at 15%.  2. History of mitral valve disease, status post mitral valve      replacement with pericardial tissue valve in February 2006.  Stable      with mean transvalvular gradient of 3 mmHg in mitral valve area by      pressure half-time of 3.4 sq cm.  3. Paroxysmal  atrial fibrillation.  The patient has been in sinus      rhythm since I have been following her.  I have continued her      anticoagulation in spite of maintaining sinus rhythm because of her      extremely low ejection fraction and history of severe mitral      valvular disease with a bioprosthetic mitral valve.  4. Hyperlipidemia.  She is on Pravachol 40 mg.  Lipids from February      showed a total cholesterol of 143, HDL 45, LDL 79.  Continue with      yearly lipids and liver function tests.   DISCUSSION:  Mary Arellano is stable.  She continues to have Class II  symptoms.  I have asked her to increase her metoprolol ER from 50-75 mg  daily.  She was advised to call over the next few weeks if she has any  progressive symptoms with her increasing beta-blocker dose.  Will  continue her current dose of Atacand at this time.  She is euvolemic on  exam and does not warrant diuretic therapy at this point.  Plans for  prophylactic ICD as noted above.  She will be  instructed to stop her  Coumadin prior to the procedure per Dr. Ladona Ridgel.  No other changes at  this point.  I would like to see Mary Arellano back in 3 months.     Veverly Fells. Excell Seltzer, MD  Electronically Signed    MDC/MedQ  DD: 01/12/2007  DT: 01/13/2007  Job #: [id number]   cc:   Jeannett Senior A. Clent Ridges, MD

## 2010-10-13 NOTE — Assessment & Plan Note (Signed)
[facility]                            CARDIOLOGY OFFICE NOTE   Mary Arellano                 MRN:          [id number]  DATE:12/15/2007                            DOB:          [birth year]    Mary Arellano returns for followup with [facility] Cardiology office  on December 15, 2007.  She is a delightful 77 year old woman with  nonischemic cardiomyopathy.  She has an LVEF of 20% and has undergone  ICD placement.  She really has done well clinically after several  hospital admissions in 2008.  She currently denies chest pain, dyspnea,  orthopnea, PND, or edema.  She is able to perform low to moderate level  of activities without symptoms.  She remains New York Heart Association  class II-III.  Overall, she feels well at present.   MEDICATIONS:  Include Coumadin as directed, multivitamin daily, Atacand  2 mg daily, amiodarone 200 mg daily, Lasix 40 mg daily, Zocor 20 mg at  bedtime, and digoxin 0.125 mg daily.   ALLERGIES:  NKDA.   PHYSICAL EXAMINATION:  GENERAL:  The patient is alert and oriented in no  acute distress.  VITAL SIGNS:  Weight is 96 pounds, blood pressure 122/66, heart rate 72,  and respiratory rate 16.  HEENT:  Normal.  NECK:  Normal carotid upstrokes without bruits.  Jugular venous pressure  is normal.  LUNGS:  Clear to auscultation bilaterally.  HEART:  Regular rate and rhythm without murmurs or gallops.  ABDOMEN:  Soft and nontender.  No organomegaly.  No abdominal bruits.  EXTREMITIES:  No clubbing, cyanosis, or edema.  Peripheral pulses are 2+  and equal throughout.   LABORATORY DATA:  Labs reviewed from Dr. Lovell Sheehan' office dated November 23, 2007, CBC was normal with a hemoglobin 13, white blood cell count 7, and  platelets 172.  Metabolic panel shows a sodium 811, potassium 4.1, BUN  18, and creatinine 1.0.   ASSESSMENT:  1. Chronic systolic heart failure secondary to severe underlying      nonischemic  cardiomyopathy with left ventricular ejection fraction      of 20%.  Mary Arellano remains remarkably stable.  She has New      [place] Heart Association class II-III symptoms.  Continue current      therapy with digoxin, Atacand, Lasix, and amiodarone. She has not      tolerated more aggressive medical therapy in the past and      medications have been limited after precipitating acute renal      failure.  She seems to be doing very well at present and will      continue with her medicines without changes.  2. Paroxysmal atrial fibrillation.  Continue Coumadin and amiodarone.      Followup TSH, LFTs, and dig level with her next blood draw at Dr.      Lovell Sheehan' office.  She was given a prescription for these labs      today.  3. Dyslipidemia.  Continues on low-dose simvastatin.   For followup, I would like to see Mary Arellano back in 3 months.     Veverly Fells.  Excell Seltzer, MD  Electronically Signed    MDC/MedQ  DD: 12/15/2007  DT: 12/16/2007  Job #: [id number]   cc:   Mary Glaze, MD

## 2010-10-13 NOTE — H&P (Signed)
NAME:  Mary Arellano, Mary Arellano          ACCOUNT NO.:  [id number]   MEDICAL RECORD NO.:  [id number]          PATIENT TYPE:  INP   LOCATION:  6708                         FACILITY:  [facility]   PHYSICIAN:  Hollice Espy, M.D.DATE OF BIRTH:  [birth year]   DATE OF ADMISSION:  04/28/2007  DATE OF DISCHARGE:                              HISTORY & PHYSICAL   PRIMARY CARE PHYSICIAN:  Dr. Birdie Sons   CHIEF COMPLAINT:  Nausea/vomiting.   HISTORY OF PRESENT ILLNESS:  The patient is a 77 year old white female,  past medical history of class III congestive heart failure, paroxysmal  atrial fibrillation, and dyslipidemia who for the past 6 days has been  having problems with daily nausea and vomiting.  She has still been able  to keep some foods down but was having at least one vomiting episode on  a daily basis.  She saw her PCP earlier on today and had labs drawn and  was told to come into the emergency room as she reportedly had elevated  potassium of greater than 6.  When she came to the emergency room for  further evaluation, labs were drawn on the patient and she was found to  have a normal potassium of only 4.9 but other labs of concern were  sodium of 124, albumin of 33, a creatinine of 1.8, and most concerning  was her Coumadin level which was found to be markedly elevated at 8.7.  The patient herself is feeling actually okay.  She denies any complaints  other than some weakness. Also, what prompted the patient to see her PCP  was a fall in which she had some lightheadedness.  When she came into  the emergency room she was noted to have some severe ecchymosis around  her right eye.  A CT scan of the head was done showed only some  superficial bleeding but no evidence of any deep trauma or internal  bleeding.  The patient on admission was also noted to have a blood  pressure of only 94/42.  Currently, the patient states she is feeling a  little bit better.  She notes not any mild headache,  no vision changes,  no dysphagia, no dizziness.  She has no chest pain or palpitations, no  wheezing or coughing.  She complains of no abdominal pain and actually  said her appetite is better today.  No constipation, diarrhea,  hematuria, dysuria; focal extremity numbness, weakness or pain.  Review  of systems otherwise negative.   PAST MEDICAL HISTORY:  1. Stage III congestive heart failure.  2. Paroxysmal atrial fibrillation.  3. Dyslipidemia.   MEDICATIONS:  1. Coumadin 2.5 mg for three days in a row followed by 5 mg on the      fourth day, then repeat.  2. Multivitamin daily.  3. Metoprolol decreased today down to 12.5 p.o. daily.  4. Atacand 2 mg p.o. daily.  5. Digoxin 25 mcg p.o. daily.  6. Spironolactone 12.5 mg p.o. daily.  7. Amiodarone 200 p.o. daily.  8. Zocor 20 p.o. daily.  9. K-Dur 10 mEq p.o. daily.  10.Lasix 40 p.o. daily.   She has  no known drug allergies.   SOCIAL HISTORY:  No tobacco, alcohol or drug use.   FAMILY HISTORY:  Noncontributory.   PHYSICAL EXAMINATION:  VITAL SIGNS ON ADMISSION:  Temperature afebrile,  heart rate 53, blood pressure 94/42, respirations 18, O2 saturation 99%  on room air.  GENERAL:  She is alert and oriented x3 in no acute distress.  HEENT:  Normocephalic.  She has evidence of bleeding around her right  eye.  Mucous membranes are slightly dry.  She has no carotid bruits.  HEART:  Regular rate and rhythm; S1, S2; 2/6 systolic ejection murmur.  LUNGS:  Clear to auscultation bilaterally.  ABDOMEN:  Soft, nontender, nondistended, positive bowel sounds.  EXTREMITIES:  Show no clubbing, cyanosis or edema.   LABORATORY WORK:  White count 9, H&H 15.6 and 45, MCV of 91, platelet  count 160, 77% neutrophils.  Sodium 124, potassium 4.9, chloride 86,  bicarb 26, BUN 33, creatinine 1.83, glucose 107.  LFTs are unremarkable  except for bilirubin of 1.6 which is similar compared to previous.  Her  INR is 8.7.  UA is pending.  Chest x-ray  shows stable, she has some  chronic cardiomegaly, and AICD, prosthetic mitral valve noted.   ASSESSMENT AND PLAN:  1. Supratherapeutic INR.  The immediate cause of why her INR would be      so high is not exactly clear.  Her liver function tests look to be      chronic.  She shows no signs of any acute flareup.  She denies any      abdominal pain.  Will continue to monitor and see if her levels      come down.  Does not look necessarily to be any type of medicine      interaction.  2. Acute renal failure.  Gently hydrate secondary to nausea and      vomiting.  3. Hyponatremia, secondary to #2.  4. Nausea and vomiting.  Might be a viral gastroenteritis.  The      patient is not having any current pain symptoms and she is able to      tolerate p.o.  If symptoms persist, consider abdominal x-ray.      Hollice Espy, M.D.  Electronically Signed     SKK/MEDQ  D:  04/29/2007  T:  04/29/2007  Job:  [id number]   cc:   Valetta Mole. Swords, MD

## 2010-10-13 NOTE — Assessment & Plan Note (Signed)
[facility]                                 ON-CALL NOTE   MELL, MELLOTT                   MRN:          [id number]  DATE:04/28/2007                            DOB:          08/01/1926    TIME:  9:30 p.m.   PHONE NUMBER:  [phone].   The call back came from her daughter, Devonne Doughty at [phone].   CHIEF COMPLAINT:  Abnormal labs.   I had gotten a call earlier tonight with a critical potassium of 6.2 and  sodium of 128, that was not hemolyzed.  I called the patient directly at  home [phone] and left her a message that she needed to go to the  emergency room for evaluation of abnormal labs.  I called several times  after that and did not get an answer.  I finally got through to her at  about 8:50 tonight.  I spoke to her at length.  She seemed very confused  and unable to process the information on her own.  I tried calling her  daughter first at [phone] and then at [phone] and left several  messages.  She called me back at 8:45 p.m.  I was able to talk to her  daughter and explained the situation.  She had gone into the doctor's  office today after having some vomiting and really not feeling well,  although she said she was feeling fairly good now.  She did in fact take  her potassium supplement tonight despite the fact that it was high  because she did not check her messages on her machine.  Her daughter is  en route to take her to [facility] now.  I did call triage and let them  know she was coming and that she needed to be evaluated for abnormal  labs.     Marne A. Tower, MD  Electronically Signed    MAT/MedQ  DD: 04/28/2007  DT: 04/28/2007  Job #: [id number]   cc:   Valetta Mole. Swords, MD

## 2010-10-13 NOTE — Consult Note (Signed)
NAME:  JOESPHINE, SCHEMM NO.:  [id number]   MEDICAL RECORD NO.:  [id number]          PATIENT TYPE:  INP   LOCATION:  6708                         FACILITY:  [facility]   PHYSICIAN:  Gerrit Friends. Dietrich Pates, MD, FACCDATE OF BIRTH:  10-16-1926   DATE OF CONSULTATION:  05/01/2007  DATE OF DISCHARGE:                                 CONSULTATION   PRIMARY CARE PHYSICIAN:  Dr. Birdie Sons.   PRIMARY CARDIOLOGIST:  Dr. Tonny Bollman.  Electrophysiologist:  Dr.  Lewayne Bunting.   CHIEF COMPLAINT:  Possible tachybrady syndrome.   HISTORY OF PRESENT ILLNESS:  Ms. Santini is a 77 year old female with  no known coronary artery disease.  She has a history of chronic systolic  congestive heart failure and a nonischemic cardiomyopathy with an EF  last measured at 15%.  She also has a history of paroxysmal atrial  fibrillation.  She came to the hospital on April 29, 2007 with  approximately a 1-week history of nausea, vomiting, decreased p.o.  intake and subjective fever.  On the morning of November 28, she sat up  in bed and when she got out of bed she fell and struck her head  resulting in a black eye.  There were no witnesses and the patient  denies syncope.  She also denies chest pain, palpitations, shortness of  breath, diaphoresis or lightheadedness.   Ms. Edelen was admitted for dehydration, nausea and vomiting.  Of  note, she had some electrolyte abnormalities with a sodium of 124, BUN  33, creatinine of 1.83 and an INR of 8.7.  She has been managed  medically and has significantly improved.   Today, her heart rate was noted to be 42 and upon further review of  vital sign reports, her heart rate has ranged from 42-111.  Cardiology  was asked to assess her for possible tachybrady syndrome.  Currently,  she is asymptomatic and denies any recent palpitations, chest pain,  shortness of breath, fevers or chills.  Of note, her pulse is currently  irregular, and she has  PVCs on an EKG, but she is not aware of these.   PAST MEDICAL HISTORY:  1. Nonischemic cardiomyopathy with an EF of 15% by echocardiogram in      August 2008.  2. Chronic systolic CHF stage III.  3. Paroxysmal atrial fibrillation.  4. Dyslipidemia.  5. History of myxomatous mitral valve with severe MR, status post      mitral valve replacement with a 27 mm pericardial Edwards valve      prosthesis, as well as closure of a PFO and oversewing of the left      atrial appendage.  6. Status post cardiac catheterization prior to the mitral valve      replacement in November 2005 showing no coronary artery disease.  7. Chronic anticoagulation with Coumadin secondary to atrial      fibrillation.  8. Anorexia.  9. Insomnia.  10.Status post colonoscopy and tonsillectomy.  11.History of diverticulitis.  12.Remote history of hemorrhoidectomy..   ALLERGIES:  No known drug allergies.   CURRENT MEDICATIONS:  1. Amiodarone 200 mg daily.  2. Digoxin 0.25  mg daily.  3. Multivitamin daily.  4. Protonix 40 mg a day.  5. Ativan p.r.n.  Of note, prior to admission she was on Coumadin, metoprolol 12.5 daily,  Atacand 2 mg daily, spironolactone 12.5 mg daily, Zocor 20 mg daily, K-  Dur 10 mEq a day and Lasix 40 mg daily.  These medications are all on  hold or discontinued.   SOCIAL HISTORY:  She lives in [place] in an assisted-living facility.  She is a retired [employer] trader.  She is a widow and has two children.  She  quit tobacco 50 years ago and has no history of alcohol or drug abuse.   FAMILY HISTORY:  Her mother died at age 29, and her father died at age  85.  Neither her mother nor any siblings have any history of coronary  artery disease, and her father was thought to have a minor heart attack  back in 1968/11/08 prior to his death, but he was at least 80 then.   REVIEW OF SYSTEMS:  Her weight is down significantly from anorexia and  decreased p.o. intake.  Until her recent illness, she was not  nauseated  with eating, she just did not feel like it.  She has some chronic  dyspnea on exertion that is not changed.  She denies arthralgias.  She  had nausea and vomiting prior to admission but not since.  She has a  poor memory.  She denies depression or anxiety.  Full 14 point review of  systems is otherwise negative.   PHYSICAL EXAMINATION:  VITAL SIGNS:  Temperature is 97.7, blood pressure  118/37, pulse 42, respiratory 22, O2 saturation 97% on room air.  GENERAL:  She is a frail elderly white female in no acute distress.  HEENT:  Normal except for severe ecchymosis around her right thigh.  NECK:  There is no lymphadenopathy, thyromegaly or JVD noted.  She has  faint carotid bruits versus transmission of her murmur noted.  CV:  Heart is slightly irregular in rate and rhythm with an S1-S2 and a  systolic murmur is noted.  Distal pulses are intact in all four  extremities with no bruits noted.  LUNGS:  Essentially clear to auscultation bilaterally.  SKIN:  She has no rashes noted, and the only lesion is ecchymosis around  the right eye.  ABDOMEN:  Soft and nontender with active bowel sounds.  EXTREMITIES:  No cyanosis, clubbing or edema noted.  MUSCULOSKELETAL:  There is no joint deformity or effusion and no spine  or CVA tenderness is noted.  NEURO:  She is alert and oriented.  Cranial nerves II-XII grossly intact  but has poor memory for recent events.   Chest x-ray shows AICD and prosthetic mitral valve noted, moderate  stable cardiomegaly is present and large lung volumes raise the  possibility of COPD.  Stable ill definition of left hemidiaphragm is  likely due to flattening of the hemidiaphragms and potentially mild left  lower lobe scarring.  No new air space opacities are identified.   EKG is sinus bradycardia, rate 55, with a first-degree AV block, P:R 234  and PVCs noted.  There are no acute ischemic changes.   LABORATORY VALUES:  Current labs include hemoglobin 12.9,  hematocrit  38.2, WBC 7.9, platelets 131.  Sodium 137, potassium 4.5, chloride 104,  CO2 30, BUN 12, creatinine 0.86, glucose 82.  INR today 4.9.   IMPRESSION:  Ms. Kaman is a 77 year old female with a history of  sick sinus syndrome  who was noted to be bradycardic here by radial pulse  measurement.  She probably has undetected PVCs.  She is on amiodarone  and warfarin, and it is unclear whether atrial fibrillation has been  documented over the past 2 years.  This is an issue best addressed at  the office.  She should be on telemetry overnight, which has been  ordered.  If no significant arrhythmia is seen, her heart rate remains  stable and she remains asymptomatic, no further cardiac workup is  indicated.  From a cardiac standpoint, she can be discharged in the a.m.   Outpatient followup will be arranged.  She has been on a homeopathic  dose of metoprolol, and unless she can tolerate more, this will be  discontinued.  We will also check a digoxin level.      Theodore Demark, PA-C      Gerrit Friends. Dietrich Pates, MD, [facility]  Electronically Signed    RB/MEDQ  D:  05/01/2007  T:  05/02/2007  Job:  [id number]   cc:   Valetta Mole. Swords, MD

## 2010-10-13 NOTE — Assessment & Plan Note (Signed)
[facility]                            CARDIOLOGY OFFICE NOTE   BRUCE, MAYERS                   MRN:          [id number]  DATE:10/25/2006                            DOB:          [birth year]    Mary Arellano was seen in outpatient followup at the [facility]  cardiology office on Oct 25, 2006.  She is a 77 year old woman with  severe nonischemic cardiomyopathy likely secondary to valvular heart  disease.  She has New York Heart Association Class II congestive heart  failure.  Her most recent echocardiogram back in February of this year  showed moderate dilatation of the left ventricle with an LVEF of 10-20%.  Since that time, she has been started on increasing medical therapy for  her congestive heart failure.  From a symptomatic standpoint, she is  doing relatively well at this time.  She continues to participate in  cardiac rehabilitation and does about 30 minutes of exercise 3 times  weekly.  She walks for 10 minutes on the treadmill followed by 10  minutes of supine bicycle and 10 minutes of walking.  She has no  symptoms with that level of activity.  She describes shortness of breath  with 1-2 flights of stairs.  She has no orthopnea, PND, or edema.  There  has been no chest pain or other symptoms.   CURRENT MEDICINES:  1. Pravachol 40 mg daily.  2. Coumadin as directed.  3. Multivitamin daily.  4. Lisinopril 5 mg daily.  5. Metoprolol ER 50 mg daily.   ALLERGIES:  NKDA.   PHYSICAL EXAMINATION:  GENERAL:  The patient is alert and oriented.  She  is in no acute distress.  VITAL SIGNS:  Weight is 103 pounds, blood pressure 110/70, heart rate  84, respiratory rate 16.  HEENT:  Normal.  NECK:  Normal carotid upstrokes without bruits.  Jugular venous  pressures normal.  LUNGS:  Clear to auscultation bilaterally.  HEART:  The apex is diffuse and delayed.  The heart is regular rate and  rhythm without murmurs or gallops.  ABDOMEN:   Soft, nontender, no organomegaly.  EXTREMITIES:  No cyanosis, clubbing, or edema.  Peripheral pulses are 2+  and equal throughout.   Her last EKG, which was performed on March 26, showed sinus rhythm with  PAC and an incomplete left bundle-branch block with a QRS duration of  110 msec.   ASSESSMENT:  Mary Arellano is currently stable from a cardiovascular  standpoint with regard to her severe nonischemic cardiomyopathy  following bioprosthetic mitral valve replacement.  At the time of her  last visit, her lisinopril was increased from 2.5 to 5 mg daily.  Her  Toprol has also been increased over the last few months up to 50 mg  daily.  She reported a systolic blood pressure this morning at cardiac  rehab in the 80s.  We should continue her on her current doses due to  this relative hypotension.  As above, her ventricular function was last  assessed in February, and I would like to recheck that in August.  I  suspect that she will not  have much recovery of her left ventricular  function and should be a candidate for an ICD at that point.  Depending  on her left ventricular ejection fraction, we will plan on an evaluation  in electrophysiology by either Dr. Graciela Husbands or Dr. Ladona Ridgel.  She may be a  candidate for a biventricular device, but her QRS duration is borderline  at this point.   I encouraged her regarding her exercise program and will plan on seeing  her back in 3 months for followup or sooner if any new problems arise.     Veverly Fells. Excell Seltzer, MD  Electronically Signed    MDC/MedQ  DD: 10/25/2006  DT: 10/25/2006  Job #: [id number]   cc:   Jeannett Senior A. Clent Ridges, MD

## 2010-10-13 NOTE — Assessment & Plan Note (Signed)
[facility]                            CARDIOLOGY OFFICE NOTE   TIMI, REESER                 MRN:          [id number]  DATE:09/12/2007                            DOB:          May 25, 1927    Khalie Wince was seen in followup in [facility] Cardiology Office on  September 12, 2007.  Ms. Berdine Addison is a delightful 77 year old woman with  severe nonischemic cardiomyopathy and an LVEF of 20%.  She has really  stabilized nicely since multiple hospital admissions in 2008.  I think  her move to [facility] has made a very positive impact.  She denies  any chest pain, dyspnea, orthopnea, PND or edema.  Her only complaint is  generalized fatigue.  She takes a regular afternoon nap and this seems  to help.  She has no specific complaints today.   MEDICATIONS:  Include Coumadin as directed, multivitamin daily, Atacand  2 mg daily, Amiodarone 200 mg daily, Lasix 40 mg daily, Zocor 20 mg at  bedtime, digoxin 0.125 mg daily.   ALLERGIES:  NKDA.   EXAM:  Ms. Berdine Addison is alert and oriented.  She is a thin elderly woman  no acute distress.  Weights 98, blood pressure 102/60, heart rate 61, respiratory rate 16.  HEENT:  Normal.  NECK:  Normal carotid upstrokes without bruits.  Jugular venous pressure  is normal.  LUNGS:  Clear hearts regular rate and rhythm with a diffuse apex.  ABDOMEN:  Soft, nontender no organomegaly.  No bruits.  EXTREMITIES:  No clubbing, cyanosis or edema.  Peripheral pulses 2+ and  equal throughout.   EKG shows sinus rhythm with LVH and marked repolarization abnormality.   ASSESSMENT:  Ms. Berdine Addison is stable from a cardiac standpoint.  Her  cardiac issues are as follows.  1. Chronic systolic heart failure.  She remains well compensated.      Continue current regimen.  We have been unable to advance medical      therapy due to hypotension and have actually precipitated transient      renal failure with more aggressive  medical therapy.  She has not      tolerated beta blockade.  She is clinically doing very well on low-      dose Atacand.  Continue with no changes.  2. Paroxysmal atrial fibrillation.  Continue Coumadin and amiodarone.      Follow up amiodarone monitoring labs with her next INR check, which      is scheduled for next week.  3. Dyslipidemia.  Remains on low-dose simvastatin.  Lipids followed by      Dr. Lovell Sheehan.   Overall Nissa is doing quite well.  Will continue her current regimen  with no changes.  Will check TSH, metabolic panel, and LFTs with her lab  drawn next week.  Followup office visit in 3 months.     Veverly Fells. Excell Seltzer, MD  Electronically Signed    MDC/MedQ  DD: 09/12/2007  DT: 09/12/2007  Job #: [id number]   cc:   Stacie Glaze, MD

## 2010-10-13 NOTE — Assessment & Plan Note (Signed)
[facility]                         ELECTROPHYSIOLOGY OFFICE NOTE   Mary, Arellano                   MRN:          [id number]  DATE:02/08/2007                            DOB:          Jan 09, 1927    [name] was seen today in the device clinic for followup of her  newly implanted Guidant Vitality ICD model #T175.  Her device was  implanted on January 23, 2007 for ischemic cardiomyopathy.   INTERROGATION OF HER DEVICE TODAY:  Demonstrates a single chamber device  with R waves of greater than 27 millivolts with an RV impedance of 615  ohms and threshold of 0.6 volts at 0.4 milliseconds.  Her shock  impedance was 43 ohms. Her battery voltage was 3.24 volts with charge  time of 6.7 seconds.  She was in normal sinus rhythm today.  She had no  evidence of any arrhythmia since last interrogation.  She is  V-pacing zero percent of the time.  Her Steri-strips were removed today.  Her wound was without redness or swelling.   She has an appointment to come back in December to see Dr. Ladona Ridgel in the  clinic.      Gypsy Balsam, RN,BSN  Electronically Signed      Doylene Canning. Ladona Ridgel, MD  Electronically Signed   AS/MedQ  DD: 02/08/2007  DT: 02/08/2007  Job #: [id number]

## 2010-10-13 NOTE — Assessment & Plan Note (Signed)
[facility]                            CARDIOLOGY OFFICE NOTE   RANE, DUMM                 MRN:          [id number]  DATE:06/05/2007                            DOB:          Aug 17, 1926    Mary Arellano was seen in followup the [facility] Cardiology office on  June 05, 2007.  Mary Arellano is a delightful 77 year old woman with  advanced left ventricular dysfunction and congestive heart failure.  She  was last hospitalized at [facility] in November with acute renal  insufficiency and digoxin toxicity.  She responded well to fluid  hydration and alterations of her medical regimen.  Mary Arellano has a  severe nonischemic cardiomyopathy and a left ventricular ejection  fraction of 15%.  She has undergone cardiac resynchronization therapy  with a biventricular ICD implant in August 2008.   From a symptomatic standpoint, Mary Arellano is doing well.  She has  had recent cold symptoms, but otherwise feels well.  She denies chest  pain, orthopnea, PND or edema.  She has chronic stable dyspnea at  moderate-level activity.  With light activities, she has been  asymptomatic.   CURRENT MEDICATIONS:  1. Coumadin as directed.  2. Multivitamin daily.  3. Atacand 2 mg daily.  4. Amiodarone 200 mg daily.  5. Lasix 40 mg daily.  6. Zocor 20 mg at bedtime.  7. Digoxin 0.125 mg daily.   ALLERGIES:  NO KNOWN DRUG ALLERGIES.   PHYSICAL EXAMINATION:  Mary Arellano is alert and oriented.  She is in no  acute distress.  Weight is a 96 pounds, blood pressure is 100/60, heart rate 64,  respiratory rate 16.  HEENT:  Normal.  NECK:  Normal carotid upstrokes without bruits.  Jugular venous pressure  is normal.  LUNGS:  Clear to auscultation bilaterally.  HEART:  Regular rate and rhythm with a diffuse apex.  There are no  murmurs or gallops.  ABDOMEN:  Soft, nontender.  No organomegaly.  EXTREMITIES:  No clubbing, cyanosis or edema.  SKIN:  Warm  and dry.   ASSESSMENT:  Chronic systolic heart failure secondary to severe  nonischemic cardiomyopathy, New York Heart Association class II to III.  Mary Arellano remain stable.  I think she has improved since I last saw  her in December.  While she is not currently on much therapy, I think  this is likely all she is going to be able to tolerate at present.  She  has very low cardiac output and has had a great deal of difficulty with  hypotension and even renal failure when she was on more aggressive  medical therapy.  I did not make any changes to her medical regimen  today.  I reviewed recent laboratory work that was done by Dr. Lovell Sheehan  on  December 18; at that time, her creatinine was normal at 0.8, potassium  was 4.2 and BUN was 7.  I would like to see Mary Arellano back in 1  month with followup laboratories at that time.     Veverly Fells. Excell Seltzer, MD  Electronically Signed    MDC/MedQ  DD: 06/05/2007  DT:  06/06/2007  Job #: [id number]   cc:   Stacie Glaze, MD

## 2010-10-13 NOTE — Assessment & Plan Note (Signed)
[facility]                            CARDIOLOGY OFFICE NOTE   PUNEET, SELDEN                 MRN:          [id number]  DATE:05/10/2007                            DOB:          1926/09/07    Mary Arellano returns for follow-up at the [facility] Cardiology  office on May 10, 2007.  Mary Arellano was recently hospitalized  approximately two weeks ago at [facility] with a 1-week history of  nausea, vomiting, and decreased p.o. intake.  She was found to have  acute renal insufficiency and digoxin toxicity.  On admission she also  had a supratherapeutic INR.  She was hydrated and appropriate  medications were held.  She recovered rapidly and is back to her  baseline.  She has a background history of severe nonischemic  cardiomyopathy with an LV EF of 15% and advanced systolic heart failure.  She has undergone cardiac resynchronization.   Since her return home from the hospital she has been feeling better.  She has made slow progress but really does not have any specific  complaints.  She denies any change in her chronic exertional dyspnea.  She has no resting dyspnea, orthopnea, or PND.  She has had no lower  extremity edema.  Her light-headedness has resolved and she has had no  further nausea or vomiting.   CURRENT MEDICATIONS:  1. Coumadin as directed.  2. Multivitamin one daily.  3. Atacand 2 mg daily.  4. Amiodarone 200 mg daily.  5. Lasix 40 mg daily.  6. K-Dur 10 mEq daily.  7. Zocor 20 mg at bedtime.  8. Digoxin 0.125 mg daily.   ALLERGIES:  No known drug allergies.   PHYSICAL EXAMINATION:  On exam Mary Arellano is an alert and oriented  elderly woman in no acute distress.  Blood pressure 102/58, heart rate 77, respiratory rate 16.  HEENT:  There is a healing area of right periorbital ecchymoses.  Otherwise HEENT is normal.  NECK:  Normal carotid upstrokes without bruits.  Jugular venous pressure  is normal.  LUNGS:  Clear to auscultation bilaterally.  HEART:  The apex is diffuse.  The heart is regular rate and rhythm  without murmurs or gallops.  ABDOMEN:  Soft, nontender.  No organomegaly.  EXTREMITIES:  No clubbing, cyanosis, or edema.  SKIN:  Warm and dry.   EKG shows normal sinus rhythm with LVH and ST segment change consistent  with repolarization abnormality.   ASSESSMENT:  1. Chronic systolic heart failure, New York Heart Association, class      III.  Mary Arellano has advanced heart failure with low cardiac      output.  She is currently stable.  Her medications were adjusted in      the hospital and her beta blocker is currently on hold, Atacand has      been decreased and Aldactone is on hold.  I plan to continue her on      her same medications at present, as I think she is doing well and      there is no evidence of volume overload.  Will repeat a basic  metabolic panel and a digoxin level today.  I would like to see her      back in approximately three weeks for follow-up.  She is going to      see Dr. Lovell Sheehan next week and that way my next visit will be offset      by his visit.  Overall I think Mary Arellano is stable but she is      very tenuous and is going to need close outpatient follow-up over      the next few months.     Veverly Fells. Excell Seltzer, MD  Electronically Signed    MDC/MedQ  DD: 05/11/2007  DT: 05/12/2007  Job #: [id number]   cc:   Stacie Glaze, MD

## 2010-10-16 NOTE — Op Note (Signed)
NAME:  Mary Arellano, Mary Arellano NO.:  [id number]   MEDICAL RECORD NO.:  [id number]          PATIENT TYPE:  INP   LOCATION:  2314                         FACILITY:  [facility]   PHYSICIAN:  Sheldon Silvan, M.D.      DATE OF BIRTH:  11-12-1926   DATE OF PROCEDURE:  07/20/2004  DATE OF DISCHARGE:                                 OPERATIVE REPORT   PROCEDURE:  Interoperative transesophageal echocardiography (TEE).   [name] was brought to the operating room today by Dr. Donata Clay  for  repair or replacement of her mitral valve due to mitral regurgitation.  It  was felt that the use of TEE would be appropriate for her for both  diagnostic and monitoring purposes.  After satisfactory induction of general  anesthesia including endotracheal intubation, the [employer]  TEE probe was lubricated and sheathed appropriately.  It was passed through  the oropharynx into the esophagus atraumatically.  The heart was imaged.  The left ventricle was examined and was mildly dilated with mildly thickened  walls concentrically.  Contractility was good with all four segments  contracting normally and there were no wall motion abnormalities noted.  The  mitral valve was imaged and it was noted there was bileaflet prolapse.  There was calcification of the valve throughout both the anterior and  posterior leaflets as well as the annulus.  On color flow exam, it was noted  there was 4+ regurgitant flow into the left atrium which was quite dilated.  The jet was eccentric posteriorly as well as centrally.  There was no  obvious reverse flow in the pulmonary veins.  The left atrial appendage was  seen and there was no thrombus noted.  The aortic valve was imaged and it  was tricuspid.  There was very little sclerosis noted and on color flow exam  in the long axis view, there was no regurgitation or stenosis noted.  The  interatrial septum was examined and in the lower third of the septum a  small  patent foramen ovale was noted on color flow exam.  The right ventricle  appeared normal.  The tricuspid valve was 2+ in terms of regurgitation by  color flow exam.   The patient was placed on cardiopulmonary bypass by Dr. Donata Clay and it was  decided to replace the mitral valve apparatus with a pericardial tissue  valve due to the heavy calcification of the valve.  On completion of repair  of both the patent foramen ovale and the mitral valve, the patient was  weaned from bypass.  TEE was used for monitoring of LV filling.  In  addition, the mitral valve was re-examined and the new apparatus appeared to  be in good position.  It was easily seen.  There was only trace  regurgitation noted centrally.  There was no color flow across the  interatrial septum after the repair had been performed on bypass.  The  patient continued to be weaned from bypass successfully.  Prior to the  patient leaving the operating room, the TEE probe was removed and the  patient was taken to  the SICU in good condition.      DC/MEDQ  D:  07/20/2004  T:  07/20/2004  Job:  [id number]

## 2010-10-16 NOTE — Discharge Summary (Signed)
NAME:  Mary Arellano, Mary Arellano NO.:  [id number]   MEDICAL RECORD NO.:  [id number]          PATIENT TYPE:  INP   LOCATION:  2014                         FACILITY:  [facility]   PHYSICIAN:  Kerin Perna, M.D.  DATE OF BIRTH:  1926-12-08   DATE OF ADMISSION:  07/20/2004  DATE OF DISCHARGE:  07/28/2004                                 DISCHARGE SUMMARY   HISTORY OF PRESENT ILLNESS:  The patient is a 77 year old white female with  a long history of mitral valve prolapse followed carefully by Armanda Magic,  M.D., with serial 2-D echocardiograms.  On the patient's recent 2-D  echocardiogram, an increase in LV diameter was noted from 4.9 cm to 5.3 cm.  She also had moderate to severe mitral regurgitation with an eccentric jet  directing posteriorly.  She had mild pulmonary hypertension and was in sinus  rhythm.  She underwent cardiac catheterization in late 2005 which  demonstrated normal coronary arteries.  She had a 3+ mitral regurgitation  with bileaflet mitral prolapse.  There was heavy annular calcification.  Her  ejection fraction was normal and left ventricular diastolic pressure was 10  mmHg.  Because of significant mitral regurgitation and left ventricular  enlargement, mitral valve repair/replacement was felt to be indicated.   PAST MEDICAL HISTORY:  1.  Mitral valve prolapse, progressing to significant mitral regurgitation      with left ventricular enlargement.  2.  Dyslipidemia.  3.  Palpitations associated with mitral valve prolapse.  4.  History of right mandible fracture.   CURRENT MEDICATIONS:  1.  Coreg 1 mg p.o. daily.  2.  Aspirin 325 mg daily.  3.  Chondroitin sulfate one p.o. daily.  4.  Lipitor 10 mg daily.  5.  Multivitamins one daily.  6.  Tums p.r.n.  7.  Calcium two p.o. b.i.d.   ALLERGIES:  None.   FAMILY HISTORY:  Please see the history and physical done at the time of  admission.   SOCIAL HISTORY:  Please see the history and physical done at  the time of  admission.   REVIEW OF SYSTEMS:  Please see the history and physical done at the time of  admission.   PHYSICAL EXAMINATION:  Please see the history and physical done at the time  of admission.   HOSPITAL COURSE:  The patient was admitted electively and on July 20, 2004, taken to the operating room where she underwent the following  procedures:  1.  Mitral valve replacement with 27 mm pericardial Edwards Bro  prosthesis, serial [id number], model #6900P.  2.  Closure of patent foramen  ovale.  3.  Oversewing of left atrial appendage.  The procedures were  performed by Kerin Perna, M.D.  The patient tolerated the procedures  well and was taken to the surgical intensive care unit in stable condition.   POSTOPERATIVE HOSPITAL COURSE:  The patient has done well.  She has  maintained stable hemodynamics, however, has had postoperative atrial  fibrillation.  She has been monitored by the cardiologist, as well as seen  in consultation by electrophysiology.  They have adjusted medications  accordingly.  She is still in atrial fibrillation, but her rate is  controlled.  She will be followed for an outpatient for this.  From a  pulmonary viewpoint, she is quite stable.  She was weaned from the  ventilator without difficulty and has also been weaned from oxygen,  maintaining good saturations on room air.  Additionally she has tolerated a  gentle diuresis and showing good and gradual improvement in her  postoperative loss of fluid weight.  She does maintain some edema and will  require further diuresis as an outpatient.  She has tolerated cardiac  rehabilitation phase 1 modalities.  All incisions are healing well without  signs of infection.  He is tolerating diet.  She has been monitored closely  with daily INRs.  Her Coumadin dose at the time of discharge will be 2.5 mg  per day.  She will follow up at [facility] for further management of  this.  Her overall status is  felt to be stable for discharge on today's  date, July 28, 2004.   MEDICATIONS ON DISCHARGE:  1.  Multivitamins one daily.  2.  Lopressor 50 mg q.8h.  3.  Lipitor 10 mg daily.  4.  Coumadin 2.5 mg daily and as directed.  5.  Lasix 40 mg daily for seven days.  6.  K-Dur 20 mEq daily for seven days.  7.  Amiodarone 400 mg twice daily for two weeks and then 400 mg daily.  8.  For pain, Ultram 50 mg one or two q.4-6h. as needed.   DISCHARGE INSTRUCTIONS:  The patient will receive written instructions in  regard to medications, activity, diet, wound care and followup.   FOLLOWUP:  Followup will include Dr. Norris Cross office for three initial  planned appointments, including PT INR on July 31, 2004, at 3:15 p.m.  An  EKG will be checked on August 05, 2004, at 10 a.m.  Dr. Mayford Knife will see the  patient on August 12, 2004, at 1:15 p.m.  Dr. Donata Clay will see the patient  on August 14, 2004, at 11 a.m.   CONDITION ON DISCHARGE:  Stable and improved.   FINAL DIAGNOSIS:  Severe mitral regurgitation, now status post mitral valve  replacement with 27 mm pericardial bioprosthetic valve.   OTHER DIAGNOSES:  1.  Postoperative atrial fibrillation.  2.  Dyslipidemia.  3.  History of right mandibular fracture.   LABORATORY DATA:  Most laboratories with INR on July 27, 2004, of 2.1.  Hemoglobin and hematocrit on July 23, 2004, were 11 and 34,  respectively.  Electrolytes dated July 28, 2004, are as follows:  Sodium  130, potassium 3.9, chloride 92, CO2 30, BUN 13, creatinine 0.8, glucose  111.      WEG/MEDQ  D:  07/28/2004  T:  07/28/2004  Job:  [id number]   cc:   Armanda Magic, M.D.  [address]  [place], [place] [address]  Fax: [phone]   Duke Salvia, M.D.   Dellis Anes Idell Pickles, M.D.  [address]  [place]  [place] [address]  Fax: [phone]

## 2010-10-16 NOTE — Discharge Summary (Signed)
NAME:  Mary Arellano, Mary Arellano          ACCOUNT NO.:  [id number]   MEDICAL RECORD NO.:  [id number]          PATIENT TYPE:  INP   LOCATION:  3736                         FACILITY:  [facility]   PHYSICIAN:  Armanda Magic, M.D.     DATE OF BIRTH:  01/26/1927   DATE OF ADMISSION:  08/12/2004  DATE OF DISCHARGE:  08/22/2004                                 DISCHARGE SUMMARY   ADMISSION DIAGNOSES:  1.  Shortness of breath.  2.  Paroxysmal atrial fibrillation.  3.  Hyperlipidemia.  4.  Anorexia.  5.  Insomnia.   DISCHARGE DIAGNOSES:  1.  Paroxysmal atrial fibrillation.  2.  Left ventricular dysfunction.  3.  Chronic Coumadin therapy.  4.  Shortness of breath, improved.  5.  Status post remote mitral valve replacement.  6.  Elevated systolic dysfunction.  7.  Anorexia, improved.  8.  Insomnia, improved.  9.  Hyperlipidemia.   PROCEDURES:  Cardiac catheterization August 17, 2004.   COMPLICATIONS:  None.   DISCHARGE STATUS:  Stable, improved.   HISTORY OF PRESENT ILLNESS:  Please see complete H&P for details.  In short,  this is a 77 year old female who is status post MVR with a prosthetic valve  July 20, 2004.  The patient began experiencing shortness of breath and  dyspnea after discharge home postoperatively.  When seen for followup in the  office, EKG showed new inferior and lateral T-waves changes.  The patient  also had complaints of orthopnea to the point she is having significant  insomnia and essentially sitting straight up in a chair.  Denied peripheral  edema. No chest pain.  No symptoms of fever.  A 2D echocardiogram was done  in the office on the day of admission which showed new inferior and lateral  hypokinesis and now LV systolic dysfunction with an EF of 40%.  She was  admitted for further treatment and evaluation.   PHYSICAL EXAMINATION:  Please see complete H&P but in short, vital signs  were stable.  She was afebrile.  Physical exam essentially without any  abnormalities.  Questionable valve click.  EKG as mentioned above.  Admission labs show a normal CBC.  C-MET shows hyponatremia at 132,  hypokalemia at 3.3, hyperglycemia at 155, albumin low at 3.2.  INR  therapeutic at 2.9.  Cardiac enzymes are normal.  TSH and digoxin level also  normal.  Amiodarone level was low at 0.6.  Chest x-ray showed cardiomegaly  and small bilateral pleural effusions with improvement in basilar  atelectasis.   HOSPITAL COURSE:  The patient was admitted to telemetry.  Serial enzymes to  rule out MI.  She was started on IV nitroglycerin.  Plans for a cardiac  catheterization to further evaluate were set up.  INR will need to be  reversed.  Outpatient medications were continued.  The potassium was  replaced.  Coumadin was placed on  hold for pending catheterization.   It took several days for INR to normalize.  By August 15, 2004, it was down  to 1.9.  Finally by August 17, 2004, it was down to 1.3.  Shortness of breath  was better.  She did have some IV Lasix.  Otherwise, vital signs remained  stable.  Other labs were normal.  She really had no further complaints or  problems during her last several days except for continued symptoms of  orthopnea although somewhat improved.   She was taken to the cardiac catheterization lab with Dr. Mayford Knife on August 17, 2004.  Results showed widely patent coronary arteries.  Moderate LV  dysfunction with an EF estimated at 30-40%.  Of note, her LV function was  normal by her catheterization just prior to her MBR.   At this point, her Coumadin was restarted with subcu Lovenox bridging.  Her  beta blocker was changed from Lopressor to Coreg to assist with LV  dysfunction.  She had Altace as well added to her regimen.  Lasix was  continued by p.o. route.   Her shortness of breath and symptoms of orthopnea continued to improve  daily.  Unfortunately, early in the morning on August 19, 2004, she went back  into atrial fibrillation.   Heart rate ranging from the low 100s to 120s.  Her dose of amiodarone was then increased back to 400 mg daily.  Her Coreg  was increased to 6.25 mg b.i.d.  She was on IV Cardizem as well.  The  following day, the heart rate remained somewhat elevated in the 110-120s.  The digoxin was added at this point.  INR was almost therapeutic at 1.7.   The patient complained of some mild nausea.  Digoxin level was checked which  was normal at 1.7.  The nausea may be possibly secondary to higher dose of  amiodarone.  Continue to monitor.  By August 22, 2004, she was feeling much  better.  Nausea had resolved. She was still in atrial fibrillation but her  heart rate was now controlled.  Her INR was now therapeutic at 2.5.  Lovenox  was discontinued, and the patient was discharged home without further  incident.   DISCHARGE MEDICATIONS:  1.  Amiodarone 200 mg b.i.d.  2.  Aspirin 81 mg daily.  3.  Lipitor 10 mg daily.  4.  Multivitamin daily.  5.  Lasix 40 mg daily.  6.  K-Dur 20 mEq daily.  7.  Coreg 3.125 mg b.i.d.  8.  Altace 2.5 mg daily.  9.  Coumadin 5 mg; 2.5 mg on the day of discharge, 5 mg the following day,      2.5 mg alternating with 5 mg after that.   DISCHARGE INSTRUCTIONS:  1.  She is instructed to stop her Lopressor.  2.  She has no activity restrictions.  She is to maintain a low fat, low      cholesterol diet, low caffeine.  3.  Home Health nurse will draw labs on Wednesday, August 26, 2004, for a B-      MET and an INR.  Dr. Mayford Knife is to interpret results.  4.  She has an appointment to see Dr. Mayford Knife back for followup Friday, August 28, 2004, at 1:45 p.m.       HB/MEDQ  D:  10/23/2004  T:  10/23/2004  Job:  [id number]   cc:   Armanda Magic, M.D.  [address]  [place], [place] [address]  Fax: [phone]

## 2010-10-16 NOTE — Assessment & Plan Note (Signed)
[facility] OFFICE NOTE   Mary Arellano, Mary Arellano                   MRN:          [id number]  DATE:04/11/2006                            DOB:          [birth year]    This is a 77 year old woman here to establish with our practice.  She is  doing well and has no acute complaints.  She had previously seen Dr.  Foye Deer for primary care, but since he has retired, she is  transferring to [place].   PAST MEDICAL HISTORY:  1. She sees Dr. Armanda Magic for cardiology care and see her about      twice a year.  She had mitral valve stenosis and in  February of      2006, Dr. Kathlee Nations Trigt did a mitral valve replacement for her.      She now has a bovine valve in place.  Around the time of her      surgery, she also had a brief episode of atrial fibrillation; she      was placed on Coumadin at that time and remains on Coumadin,      although she has been in sinus rhythm ever since.  2. She has had a tonsillectomy.  3. She has had a vaginal delivery.  4. She had a normal colonoscopy in 2005.  5. She sees Dr. Eda Paschal on a regular basis for gynecology exams.  6. She has had diverticulitis and chickenpox in the past as well.  7. History of high cholesterol.   MEDICATION ALLERGIES:  None.   CURRENT MEDICATIONS:  1. Coumadin 2.5 mg b.i.d.  2. Toprol-XL 25 mg one-half a tablet per day.  3. Pravachol 40 mg per day.  4. Glucosamine daily.  5. Multivitamins daily.   HABITS:  She quit smoking 50 years ago.  She does not use alcohol.   SOCIAL HISTORY:  She is widowed.  She is a Futures trader.   FAMILY HISTORY:  Unremarkable.   OBJECTIVE:  Height 5 feet 1 inch, weight 121.  BP 118/74, pulse 72 and  regular.  GENERAL:  She appears to be quite healthy.  NECK:  Supple without lymphadenopathy or masses.  LUNGS:  Clear.  CARDIAC:  Rate and rhythm regular without gallops, murmurs or rubs.  Distal pulses are full.  EXTREMITIES:   No edema.   ASSESSMENT AND PLAN:  1. Status post mitral valve replacement, apparently doing well.  She      will follow up with her cardiologist.  2. Status post episodic atrial fibrillation, apparently doing well.      To my mind, she could probably come off Coumadin at this point; I      asked her to discuss this with Dr. Mayford Knife the next time she sees      her.  3. Hyperlipidemia, apparently stable.  We will get copies of her past      records sent to [place].     Tera Mater. Clent Ridges, MD  Electronically Signed    SAF/MedQ  DD: 04/12/2006  DT: 04/12/2006  Job #: [id number]

## 2010-10-16 NOTE — Assessment & Plan Note (Signed)
[facility]                            CARDIOLOGY OFFICE NOTE   SOLARA, GOODCHILD                   MRN:          [id number]  DATE:07/06/2006                            DOB:          1926/10/30    Mary Arellano is a delightful 77 year old woman who presents today  to establish cardiac care.   She has been previously followed by Dr. Armanda Magic at [facility], but is changing due to insurance issues.  She has a history  of mitral valve disease and is status post mitral valve replacement with  a pericardial tissue valve in February of 2006.  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  Preoperatively, she had  normal left ventricular function and postoperatively she had a reduction  in her overall LVEF with global LV dysfunction and an estimated ejection  fraction of 30-40% at that time.  She has had a post-op catheterization  that demonstrated normal coronary arteries.   From a symptomatic standpoint, Mary Arellano complains of dyspnea with  exertion and generalized fatigue with activity as well.  She denies any  chest pain, orthopnea, PND, or edema.  She has palpitations.  She denies  light-headedness or syncope.  Overall, she feels relatively well.  Her  daughter is with her today and states that she notices that Mary Arellano becomes short of breath with fairly low level activity.   PAST MEDICAL HISTORY:  Pertinent for the following.  1. 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  2. Global left ventricular dysfunction of nonischemic etiology.  3. Postoperative paroxysmal atrial fibrillation.  No evidence of      atrial fib in followup over the past few years.  Maintained on      Warfarin for anticoagulation.  4. Remote  hemorrhoidectomy.  5. Dyslipidemia.   SOCIAL HISTORY:  The patient is widowed.  She has 2 children who live  locally.  She regularly participates at cardiac rehab at [facility]  She is a remote smoker, greater than 50 years ago.  She does  not drink alcohol.   FAMILY HISTORY:  Pertinent for longevity in her parents.  Her mother  died at age 33 and father died at age 27.  She has an 40 year old  sister.  Her father was thought to have a minor heart attack back in  1970.   CURRENT MEDICATIONS:  1. Pravachol 40 mg daily.  2. Metoprolol XL 12.5 mg daily.  3. Coumadin as directed.  4. Multivitamin daily.  5. Glucosamine daily.   ALLERGIES:  No known drug allergies.   REVIEW OF SYSTEMS:  A complete 12-point review of systems was performed.  All systems were negative, except as described in the HPI above.   PHYSICAL EXAMINATION:  The patient is alert and oriented.  She is a thin  woman in no acute distress.  Her weight is 109 pounds, blood pressure  123/78, heart rate is 110,  respiratory rate is 18.  HEENT:  Normal.  NECK:  Normal carotid upstrokes without bruits.  Jugular venous pressure  is normal.  There is no thyromegaly or thyroid nodules.  LUNGS:  Clear to auscultation bilaterally.  CARDIOVASCULAR:  There is a diffuse enlarged apical impulse.  The heart  is regular rate and rhythm with a soft S3 present.  I do not appreciate  any murmur.  There is no right ventricular heave or lift.  ABDOMEN:  Soft and nontender.  No organomegaly.  No abdominal bruits.  EXTREMITIES:  No cyanosis, clubbing, or edema.  Peripheral pulses are 2+  and equal throughout.  SKIN:  Warm and dry without rash.  NEUROLOGIC:  Cranial nerves 2-12 are intact.  Strength is 5/5 and equal  in the arms and legs bilaterally.  LYMPHATICS:  There is no adenopathy.  MUSCULOSKELETAL:  There is no joint swelling.   ELECTROCARDIOGRAM:  Demonstrates sinus tachycardia with left anterior  fascicular block  and poor R wave progression across the precordium,  likely related to the fascicular block.  There is LVH with  repolarization abnormality present as well.   ASSESSMENT:  Mary Arellano is a 77 year old woman with a history of  severe mitral regurgitation now approximately 2 years status post mitral  valve replacement with a tissue valve.  She has developed postoperative  left ventricular dysfunction in the absence of coronary artery disease.  Currently, she is on low-dose beta blockade therapy.   I think by clinical exam she has mild congestive heart failure.  Much of  her symptomatology appears chronic.   I have asked her to increase her Toprol dose from 12.5 mg to 25 mg  daily.  I have ordered an echocardiogram to review her degree of left  ventricular dysfunction, as well as to look at her noninvasive  hemodynamic parameters.  I have also asked for lab testing to include  CBC, complete metabolic panel, lipids, and LFTs, as well as a BNP.  This  will be done when she comes back for her echocardiogram.  I have also  asked for a 48 hour Holter monitor to evaluate for occult atrial  fibrillation.  I would be inclined to discontinue her Coumadin if she is  staying in sinus rhythm, as she is 2 years out from surgery and, by my  review, has not had any atrial fibrillation since the immediate  postoperative period.  I will plan on seeing her back in 3 weeks after  these studies are completed to review the data and see how she is doing  clinically on the  increased dose of beta blocker.  I would like to start her on an ACE  inhibitor as well, but we will see how she tolerates the addition of  beta blockade first.     Veverly Fells. Excell Seltzer, MD  Electronically Signed    MDC/MedQ  DD: 07/06/2006  DT: 07/06/2006  Job #: [id number]   cc:   Tera Mater. Clent Ridges, MD

## 2010-10-16 NOTE — Assessment & Plan Note (Signed)
[facility]                            CARDIOLOGY OFFICE NOTE   MELESSA, COWELL                   MRN:          [id number]  DATE:07/28/2006                            DOB:          Mar 09, 1927    HISTORY OF PRESENT ILLNESS:  Ms. Waldo is a 77 year old woman who  returns today for followup after her initial evaluation on February 6.  She has a history of mitral valve disease and is status post mitral  valve replacement with a pericardial tissue valve in February of 2006.  She had marked deterioration of her left ventricular function  postoperatively despite normal coronary arteries.  Her left ventricular  ejection fraction was in the range of 30-40%.   After her initial evaluation I had an echocardiogram performed on  February 13 that demonstrated moderate dilatation of the left ventricle  with severe global reduction in left ventricular function with an  estimated ejection fraction of 10-20%.  The mean transvalvular mitral  gradient was 3 mmHg, and the left atrium was markedly dilated.  There  was also a moderate increase in estimated pulmonary artery systolic  pressure of 48 mmHg.   A 24 hour Holter monitor was also performed, as the patient had a  history of paroxysmal atrial fibrillation in the post-op period and I  wanted to evaluate her rhythm because of this.  This demonstrated sinus  rhythm with frequent supraventricular ectopics with a few runs of  paroxysmal SVT up to 9 beats.  There were also frequent ventricular  ectopics with the longest non-sustained VT of 3 beats.  There was no  complex arrhythmia seen.   From a symptomatic standpoint, Ms. Einspahr is doing fine.  She has no  orthopnea, PND, or edema.  She has exertional dyspnea with walking  upstairs.  She is not limited in her daily activities.  She continues to  attend cardiac rehab.   CURRENT MEDICATIONS:  1. Pravachol 40 mg daily.  2. Coumadin as directed.  3.  Multivitamin daily.  4. Lisinopril 2.5 mg daily.  5. Toprol XL 25 mg daily.   ALLERGIES:  No known drug allergies.   EXAM:  She is alert and oriented, in no acute distress.  Weight is 108 pounds, blood pressure is 100/62, heart rate is 90,  respiratory rate is 16.  HEENT:  Normal.  NECK:  Normal carotid upstrokes without bruits.  Jugular venous pressure  is normal.  LUNGS:  Clear to auscultation bilaterally.  HEART:  Demonstrates an enlarged apex with an S3 and an S4 present.  There were no murmurs present.  ABDOMEN:  Soft and nontender.  No organomegaly.  EXTREMITIES:  No cyanosis, clubbing, or edema.  Peripheral pulses are 2+  and equal throughout.   ASSESSMENT:  Ms. 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   She has tolerated the addition of low-dose lisinopril, as well as an  increase in her beta blocker dose.  I have asked her again  to increase  her Toprol XL dose to 37.5 mg daily and continue on her current  lisinopril dose.  Her resting heart rate in the office is improved from  110 to 90.  I would like to titrate her Toprol up to approximately 50 mg  and then attempt to increase her ACE inhibitor as her blood pressure  allows.  Of note, her BNP was markedly elevated at 1792.  Despite that,  she has no congestive symptoms.  I will hold on any diuretic therapy at  present, since she is not having congestive symptoms.   Regarding her long-term Coumadin, I think with her ectopy seen on Holter  monitor, as well as her severe reduction in LV function and markedly  dilated left atrium, I am reluctant to discontinue Coumadin.  I did not  see any atrial fibrillation, but it was only a 24 hour monitoring.  This  was reviewed and the patient is agreeable.   My overall plan with Ms. Verrastro  is if we can get her up to evidence-  based doses of both ACE inhibitor and beta blocker over the next 6  months, I would like to reevaluate her LV function and if it continues  to be less than 30%, I think she should have an EP referral for  consideration of a prophylactic ICD.  This was also reviewed with her  today.   I encouraged her to continue with cardiac rehab.  I will plan on  increasing her Toprol to 50 mg in 2 weeks and seeing her back in the  clinic in 1 month.     Veverly Fells. Excell Seltzer, MD  Electronically Signed    MDC/MedQ  DD: 07/28/2006  DT: 07/28/2006  Job #: [id number]   cc:   Jeannett Senior A. Clent Ridges, MD

## 2010-10-16 NOTE — Op Note (Signed)
NAME:  Mary Arellano, BUFKIN NO.:  [id number]   MEDICAL RECORD NO.:  [id number]          PATIENT TYPE:  INP   LOCATION:  2314                         FACILITY:  [facility]   PHYSICIAN:  Kathlee Nations Trigt III, M.D.DATE OF BIRTH:  Jan 05, 1927   DATE OF PROCEDURE:  07/20/2004  DATE OF DISCHARGE:                                 OPERATIVE REPORT   OPERATION:  1.  Mitral valve replacement with 27-mm pericardial Edwards Brow prosthesis,      serial number [id number], model number [id number].  2.  Closure of patent foramen ovale.  3.  Oversewing of left atrial appendage.   PREOPERATIVE DIAGNOSIS:  Severe mitral regurgitation with class III to IV  CHF.   POSTOPERATIVE DIAGNOSIS:  Severe mitral regurgitation with class III to IV  CHF.   SURGEON:  Kerin Perna, M.D.   ASSISTANT:  Rowe Clack, P.A.-C.   ANESTHESIA:  General by Sheldon Silvan, M.D.   INDICATIONS:  The patient is a 77 year old female with progressive CHF and  known longstanding mitral regurgitation which has been progressive over  serial 2-D echoes. She is felt to be a candidate for mitral valve repair -  replacement and left and right cardiac catheterization were performed  earlier this winter which demonstrated normal coronaries, EF of 50%, and  moderate to severe mitral regurgitation. There is annular calcification  around the mitral valve. There is no significant aortic valvular disease.  She is felt to be candidate for mitral valve repair - replacement.   Prior to surgery, I examined the patient in the office on at least two  occasions and reviewed the results of her cardiac cath and echo studies. I  discussed the indications and expected benefits of mitral valve replacement  - or repair for treatment of her mitral regurgitation. She understood the  alternatives to surgical therapy for moderate to severe mitral regurgitation  and expected outcome of those alternative therapies. I reviewed with the  patient and  family the major aspects of the planned procedure including the  use of general anesthesia, the location of the surgical incision, the  options for treating her valve disease with mitral valve repair or if not  possible then mitral valve replaced with a bioprosthetic valve, the use of  cardiopulmonary bypass, and expected postoperative hospital recovery. I  reviewed with the patient the risks to her of mitral valve replacement  including the risks of MI, CVA, bleeding, blood transfusion requirement,  infection, and death. She understood these implications for the surgery and  agreed to proceed with operation as planned under what I felt was an  informed consent.   OPERATIVE FINDINGS:  The patient's heart was dilated. Her pulmonary  pressures were moderately elevated at 44/20. The mitral valve annulus had  severe calcification along the posterior annulus and into the posterior  commissure. This prevented mitral valve repair and replacement was  performed. The patient had a PFO by PE which was closed. The patient had  significant anemia at the onset of the operation with a hemoglobin of 6 to 8  grams and received 2 units of packed cells in the  operating room. The  patient had a low platelet count on bypass of 60,000 and received a unit of  platelets in the operating room.   PROCEDURE:  The patient was brought to the operating room and placed supine  on the operating table where general anesthesia was induced under invasive  hemodynamic monitoring. The chest, abdomen and legs were prepped with  Betadine and draped as a sterile field. The transesophageal 2-D echo probe  was placed by the anesthesiologist and the preoperative diagnosis was  confirmed of severe mitral regurgitation. The sternal incision was made. The  pericardium was opened and the sternum retracted. Heparin was administered  for the aprotinin protocol used for this operation. Pursestrings were placed  in the ascending aorta  and right atrium and the patient was cannulated and  placed on bypass. A second pursestring was placed in the lower right atrium  and the inferior vena cava was cannulated with a separate cannula for  bicaval drainage. The interatrial groove was dissected out and cardioplegia  catheters were placed for both antegrade aortic and retrograde coronary  sinus cardioplegia. Tapes were placed around the SVC and the IVC and the  patient was cooled to 30 degrees. Aortic crossclamp was applied and 900 mL  of cold blood cardioplegia was delivered in split doses between the  antegrade aortic and retrograde coronary sinus catheters. There is good  cardioplegic arrest with septal temperature dropping to less than 10  degrees. Topical iced saline was used to augment myocardial preservation. A  pericardial insulator pad was used to protect the left phrenic nerve. The  patient was given cardioplegia every 20 minutes while the crossclamp was in  place.   The left atriotomy incision was performed and the atrial retractors were  positioned to expose the valve. The valve was tested and found to have  prolapsing segments of A2 and A3 and the P2 segment. However, there was  severe calcification from P2-P3 on the posterior annulus and involving the  posterior commissure heavily. It was felt that mitral valve repair was not  possible and replacement was performed. The valve was excised. There is  severe foreshortening of the subvalvular apparatus especially in the  posterior leaflet. There was significant calcium along the posterior annulus  and posterior commissure which were debrided both with the Metzenbaum  scissors as well as the rongeur instrument. The annulus and the orifice were  irrigated with copious amounts of cold saline. Meticulous attention was  taken to avoid embolization of calcium from the operative field. The valve sutures were then placed. We used horizontal mattress supra-annular 2-0  Ethibond  sutures. 17 sutures were placed in total. These were especially  difficult to place around the area of the posterior commissure where the  calcium was transmural and extensive. After the sutures were placed, the  orifice was sized to a 27 mm pericardial Edwards valve. This was then  prepared according to protocol. The valve sutures were placed through the  sewing ring and the valve was seated and the sutures were tied. The valve  was checked and found be well seated without evidence of perivalvular space  or leak. After the valve had been placed, the left atrial appendage was  oversewn with two running 4-0 Prolene. The left atriotomy was then closed in  two layers using running #4-0 Prolene. An LV vent was placed through the  atriotomy incision and air was vented from the left side of the heart and  the coronaries using the  usual maneuvers on bypass as well as a dose of  retrograde warm blood cardioplegia ([place]). Aortic crossclamp was then  removed as the LV vent was removed through the atriotomy incision and the  suture was tied. A second suture line was used to cover the atriotomy  incision.   The heart resumed a spontaneous rhythm. The cardioplegia cannulas and the  ascending aortic vent were removed. The caval tapes were tightened back down  and a right atriotomy was performed. The patent foramen ovale was identified  in the usual anatomic location and closed with two interrupted 4-0 Prolene  sutures. The right atriotomy was then closed using a double layer of running  4-0 Prolene. The caval tapes were released and the right atrium was filled.  The patient was rewarmed to 37 degrees and temporary pacing wires were  applied. Lungs re-expanded and the ventilator was resumed. The inferior vena  caval cannula was removed. The patient was weaned from bypass without  difficulty on no inotropes. Blood pressure and cardiac output were adequate.  Protamine was administered without adverse  reaction. The cannulas were  removed. The mediastinum was irrigated warm saline. The superior pericardial  fat was closed over the aorta and right ventricle. Hemostasis was not  adequate and the patient was given FFP and platelets. The coagulation  parameters were abnormal. The patient remained hemodynamically stable. Two  mediastinal chest tubes were placed brought out through separate incisions.  The sternum was closed with interrupted steel wire. The pectoralis fascia  was closed with a running #1 Vicryl. The subcutaneous and skin layers were  closed  with a running Vicryl and sterile dressings were applied. Total bypass time  was 180 minutes with crossclamp time of 130 minutes. After coming off bypass  the transesophageal 2-D echo showed the valve to be functioning well without  paravalvular leak and good global LV function. The PFO was closed by echo.     PV/MEDQ  D:  07/20/2004  T:  07/20/2004  Job:  [id number]   cc:   CVTS Office   Armanda Magic, M.D.  [address], [address]  [place], [place] [address]  Fax: [phone]   Theressa Millard, M.D.  [address] [place]  [place] [address]  Fax: [phone]

## 2010-10-16 NOTE — Cardiovascular Report (Signed)
NAME:  Mary Arellano, Mary Arellano NO.:  [id number]   MEDICAL RECORD NO.:  [id number]          PATIENT TYPE:  INP   LOCATION:  3736                         FACILITY:  [facility]   PHYSICIAN:  Armanda Magic, M.D.     DATE OF BIRTH:  10-08-1926   DATE OF PROCEDURE:  08/17/2004  DATE OF DISCHARGE:  08/22/2004                              CARDIAC CATHETERIZATION   PROCEDURE:  Coronary angiography.   OPERATOR:  Armanda Magic, M.D.   INDICATIONS:  Shortness of breath and congestive heart failure.   COMPLICATIONS:  None.   IV ACCESS:  Via a right femoral artery 6-French sheath.   This is a 77 year old white female with a history of prosthetic mitral valve  replacement who developed new ischemic changes with increased shortness of  breath, unable to lie flat to sleep. She is status post mitral valve  replacement with a prosthetic mitral valve on 07/20/2004 and now presents  for cardiac catheterization.   The patient is brought to the cardiac catheterization laboratory in a  fasting non sedated state. Informed consent was obtained.  The patient was  connected to continuous heart rate and pulse oximetry monitoring and  intermittent blood pressure monitoring.  The right groin was prepped and  draped in a sterile fashion. A 1% Xylocaine was used for local anesthesia.  Using the modified Seldinger technique, a 6-French sheath was placed in the  right femoral artery. Under fluoroscopic guidance, a 6-French JL-4 catheter  was placed in the left coronary artery. Multiple cine films were taken at  the 30 degree RAO and 40 degree LAO views. This catheter was then exchanged  over a guide wire for 6-French JR-4 catheter which was placed under  fluoroscopic guidance in the right coronary artery. Multiple cine films were  taken at 30 degree, 40 degree LAO views. The catheter was then removed over  a guide wire and at the end of the procedure, all catheters and sheaths were  removed. Manual  compression was performed until an adequate hemostasis was  obtained. The patient was transferred back to her room in stable condition.   The left main coronary artery is widely patent and bifurcates into the left  anterior descending artery and left circumflex artery. The left anterior  descending artery is widely patent throughout its course and bifurcates into  a few diagonal branches both of which are widely patent.   The left circumflex is widely patent throughout its course in the AV groove,  giving rise to two obtuse marginal branches, both of which are widely  patent.   The right coronary is widely patent throughout its course, bifurcating  distally into the posterior descending artery and posterolateral artery,  both of which are widely patent.   Aortic pressure was 136/68 mmHg.   ASSESSMENT:  1.  Normal coronary arteries.  2.  Moderate left ventricular dysfunction by echocardiogram, ejection      fraction 30-40% at that time, questionable etiology. The patient's left      ventricular function was normal at her first catheterization prior to      mitral valve replacement.  3.  Congestive heart  failure. Improved on diuretics.  4.  Paroxysmal atrial fibrillation.   PLAN:  Continue Lasix, restart Coumadin, subcutaneous Lovenox until INR  greater than or equal to 2. To 2.5. Change Lopressor to Coreg.       TT/MEDQ  D:  12/09/2004  T:  12/09/2004  Job:  [id number]

## 2010-10-16 NOTE — Cardiovascular Report (Signed)
NAME:  Mary Arellano, SEDLAK          ACCOUNT NO.:  [id number]   MEDICAL RECORD NO.:  [id number]          PATIENT TYPE:  OIB   LOCATION:  2899                         FACILITY:  [facility]   PHYSICIAN:  Armanda Magic, M.D.     DATE OF BIRTH:  [birth year]   DATE OF PROCEDURE:  04/03/2004  DATE OF DISCHARGE:                              CARDIAC CATHETERIZATION   PROCEDURE:  1.  Right and left heart catheterization.  2.  Coronary angiography.  3.  Left ventriculography.   OPERATOR:  Armanda Magic, M.D.   INDICATIONS:  Mitral valve prolapse with mitral regurgitation.   COMPLICATIONS:  None.   IV ACCESS:  Via right femoral artery with 6 French sheath, and right femoral  vein 8 French sheath.   MEDICATIONS:  Robitussin with Codeine 5 mL p.o.   This is a 77 year old white female with a history of mitral valve prolapse  and mitral regurgitation recently with followup 2-D echocardiogram showing  new mild pulmonary hypertension and low normal left ventricular systolic  function with at least 2-3+ MR and bileaflet mitral valve prolapse.  Given  that she has had an increase in her LV dimensions from her previous echo as  well as low normal left ventricular systolic function and mild pulmonary  hypertension, she presents now for a right and left heart catheterization to  determine whether her valve is at the point of need to be replaced or  repaired.   The patient is brought to the cardiac catheterization laboratory in a  fasting nonsedated state.  Informed consent was obtained.  The patient was  connected to continuous heart rate and pulse oximetry monitoring,  intermittent blood pressure monitoring.  The right groin was prepped and  draped in a sterile fashion.  1% Xylocaine was used for local anesthesia.  Using modified Seldinger technique, a 6 French sheath was placed in the  right femoral artery.  Using a modified Seldinger technique, an 8 French  sheath was also placed in the right femoral  vein.  Under fluoroscopic  guidance, an 8 [place] Swan-Ganz catheter was placed under balloon floatation  into the right atrium.  Right atrial pressures were measured and right  atrial O2 saturations were obtained.  The catheter was than advanced into  the right ventricle and right ventricular pressure was measured as well as  O2 saturations.  The catheter was then advanced into the main pulmonary  artery and O2 saturations and pulmonary artery pressure were measured.  The  catheter was then advanced into the pulmonary capillary wedge position where  pulmonary capillary wedge pressure was measured.  The balloon was then  deflated and pulled back into the main pulmonary artery.  Cardiac outputs  were measured using 10 mL of saline for each injection.  After the cardiac  outputs were obtained, the Swan-Ganz catheter was removed.   Under fluoroscopic guidance, a 6 [place] JL-4 catheter was placed in the left  coronary ostia.  Multiple cine films were taken at 30-degree RAO, 40-degree  LAO views.  This catheter was then  exchanged out over guide wire for 6  [place] JR-4 catheter which was placed  under fluoroscopic guidance in the  right coronary artery.  Multiple cine films were taken at 30-degree RAO, 40-  degree LAO views.  The catheter was then exchanged over guide wire for 6  French angled pigtail catheter which was placed under fluoroscopic guidance  in the left ventricular cavity.  Left ventriculography was performed in 30-  degree RAO views using total 30 ml contrast at 15 ml per second.  The  catheter was then pulled back across the aortic valve with no significant  gradient noted.  At the end of the procedure, all catheters and sheaths were  removed.  Manual compression was performed until adequate hemostasis was  obtained.  The patient was transferred back to the room in stable condition.   RESULTS:  Right atrial pressure 7/5 mmHg with mean of 4 mmHg.  RV pressure  25/1 mmHg with a  mean of 5 mmHg.  PA pressure 27/12 mmHg with a mean of 18  mmHg.  Pulmonary capillary wedge pressure 12/10 mmHg with a mean of 9 mmHg.  O2 saturations:  RA 59%, RV  66%, PA 66%, aortic saturation 96%.  Cardiac  output:  Thermodilution 3.1, Fick 2.7.  Cardiac index thermodilution 2.1,  Fick 1.7.   Left main coronary artery is widely patent and bifurcates into the left  anterior descending artery and left circumflex artery.   The left anterior descending artery is widely patent throughout the course  at the apex and gives rise to one diagonal branch which is widely patent.  The left circumflex is widely patent throughout its course and it gives rise  to one large obtuse marginal branch which is widely patent.   The right coronary artery is widely patent throughout its course distally  and bifurcates into a posterior descending and posterior lateral branch,  both of which are widely patent.   Left ventriculography shows normal left ventricular systolic function, EF 50-  55%.  There is 3+ MR and bileaflet mitral valve prolapse.  Heavily calcified  mitral valve annulus.  Aortic pressure 148/63 mmHg, LV pressure 145/8 mmHg.   ASSESSMENT:  1.  Heavily calcified mitral valve annulus with bileaflet mitral valve      prolapse and 3+ mitral regurgitation.  2.  Normal pulmonary artery pressures.  3.  Normal coronary arteries.  4.  Low normal left ventricular systolic function.   PLAN:  1.  Discharge to home after IV fluid, bed rest.  2.  Continue SBE prophylaxis.  3.  Follow up with me in two weeks for groin check.  4.  Outpatient CVTS consult for evaluation of timing of mitral valve      replacement.       TT/MEDQ  D:  04/03/2004  T:  04/03/2004  Job:  [id number]

## 2010-10-16 NOTE — Assessment & Plan Note (Signed)
[facility]                            CARDIOLOGY OFFICE NOTE   ALAYHA, BABINEAUX                   MRN:          [id number]  DATE:08/24/2006                            DOB:          07/15/1926    HISTORY OF PRESENT ILLNESS:  Ms. Mcelwee is a 77 year old woman who  returns today for cardiac followup as an outpatient at the [facility].  She has severe nonischemic cardiomyopathy.  She has  New York Heart Association class II-III CHF.  Her most recent  echocardiogram demonstrated moderate dilatation of the left ventricular  with a left ventricular ejection fraction of 10-20% with severe diffuse  LV hypokinesis.  The left atrium was markedly dilated and the estimated  PA pressure was moderately increased.  There was also moderate tricuspid  regurgitation.   From a symptomatic standpoint, Ms. Manley is stable.  She continues  to have fatigue and dyspnea with one block of walking.  She has no rest  symptoms.  She denies edema.  She was able to bowl three games yesterday  with her best game being a 140.  She does not have orthopnea, PND or  other symptoms.   CURRENT MEDICATIONS:  1. Pravachol 40 mg daily.  2. Coumadin as directed.  3. Multivitamin daily.  4. Lisinopril 2.5 mg daily.  5. Metoprolol ER 50 mg daily.   ALLERGIES:  No known drug allergies.   EXAMINATION:  GENERAL:  She is alert and oriented in no acute distress.  VITAL SIGNS:  Her weight is 109 pounds, her blood pressure is 99/62,  heart rate is 85, respiratory rate is 16.  HEENT:  Normal.  NECK:  Normal carotid upstrokes without bruits.  Jugular venous pressure  is normal.  LUNGS:  Clear to auscultation bilaterally.  HEART:  The apex is diffuse and the apical impulse is delayed.  The  heart is regular rate and rhythm with no gallops or murmurs.  ABDOMEN:  Soft, nontender, no organomegaly, no abdominal bruits.  EXTREMITIES:  No clubbing, cyanosis, or  edema.  Peripherally, the  extremities are warm and well perfused.   EKG shows normal sinus rhythm with a left IVCD and left axis deviation.   ASSESSMENT:  Ms. Baade is currently stable from a cardiovascular  standpoint.  She continues to have New York Heart Association class II-  III congestive heart failure secondary to underlying severe nonischemic  cardiomyopathy following bioprosthetic mitral valve replacement.  I have  asked her to increase her lisinopril from 2.5 mg to 5 mg daily.  She  should continue on 50 mg of Toprol-XL daily.  She should also continue  on warfarin for anticoagulation in the setting of her severe LV  dysfunction and atrial dysrhythmias.  Will plan on reevaluating her LV  function with a repeat echocardiogram this fall.  If she has persistent  severe LV dysfunction will plan on an EP referral for consideration of a  prophylactic ICD.     Veverly Fells. Excell Seltzer, MD  Electronically Signed    MDC/MedQ  DD: 08/24/2006  DT: 08/24/2006  Job #: [id number]

## 2010-11-05 ENCOUNTER — Ambulatory Visit (INDEPENDENT_AMBULATORY_CARE_PROVIDER_SITE_OTHER): Payer: Medicare PPO | Admitting: Internal Medicine

## 2010-11-05 DIAGNOSIS — I4891 Unspecified atrial fibrillation: Secondary | ICD-10-CM

## 2010-11-05 LAB — POCT INR: INR: 2

## 2010-11-05 NOTE — Patient Instructions (Signed)
Same dose, 5 mg on wednesdays and sundays, 2.5 mg on other days Check in 4 weeks 

## 2010-11-06 ENCOUNTER — Ambulatory Visit: Payer: Medicare PPO

## 2010-11-24 ENCOUNTER — Encounter: Payer: Self-pay | Admitting: Internal Medicine

## 2010-11-24 ENCOUNTER — Ambulatory Visit (INDEPENDENT_AMBULATORY_CARE_PROVIDER_SITE_OTHER): Payer: Medicare PPO | Admitting: Internal Medicine

## 2010-11-24 VITALS — BP 115/57 | HR 72 | Resp 16 | Ht 62.0 in | Wt 131.0 lb

## 2010-11-24 DIAGNOSIS — I428 Other cardiomyopathies: Secondary | ICD-10-CM

## 2010-11-24 DIAGNOSIS — I5022 Chronic systolic (congestive) heart failure: Secondary | ICD-10-CM

## 2010-11-24 DIAGNOSIS — I4891 Unspecified atrial fibrillation: Secondary | ICD-10-CM

## 2010-11-24 DIAGNOSIS — Z95810 Presence of automatic (implantable) cardiac defibrillator: Secondary | ICD-10-CM

## 2010-11-24 NOTE — Patient Instructions (Signed)
Your physician recommends that you schedule a follow-up appointment in: 3 months with device clinic and 12 months with Dr Taylor  Your physician recommends that you continue on your current medications as directed. Please refer to the Current Medication list given to you today.   

## 2010-11-26 ENCOUNTER — Encounter: Payer: Self-pay | Admitting: Internal Medicine

## 2010-11-26 NOTE — Progress Notes (Signed)
HPI Mrs. Deathrage returns today for followup. She is a pleasant elderly woman with a h/o COPD, Chronic systolic heart failure due to an ICM, and HTN. She has chronic dyspnea due to systolic heart failure and copd. No recent ICD shocks. She denies palpitations. No recent falls. Allergies  Allergen Reactions  . Simvastatin     REACTION: liver function elevation     Current Outpatient Prescriptions  Medication Sig Dispense Refill  . acetaminophen (TYLENOL) 325 MG tablet Take 650 mg by mouth every 6 (six) hours as needed.        Marland Kitchen amoxicillin (AMOXIL) 500 MG tablet Take 500 mg by mouth. 4 tabs 1 hours prior to dental work       . digoxin (LANOXIN) 0.125 MG tablet Take 125 mcg by mouth every other day.        . furosemide (LASIX) 40 MG tablet Take 40 mg by mouth daily.        Marland Kitchen levothyroxine (SYNTHROID, LEVOTHROID) 25 MCG tablet Take 25 mcg by mouth daily.        . metoprolol succinate (TOPROL-XL) 25 MG 24 hr tablet Take 25 mg by mouth daily.        . multivitamin (THERAGRAN) per tablet Take 1 tablet by mouth daily.        . nitrofurantoin (MACRODANTIN) 50 MG capsule Take 50 mg by mouth daily.        . Nutritional Supplements (ENSURE HIGH PROTEIN PO) Take by mouth daily.        . OMEGA-3 KRILL OIL 300 MG CAPS Take by mouth 2 (two) times daily.        . sodium chloride (OCEAN) 0.65 % nasal spray 1 spray by Nasal route as needed.        . valsartan (DIOVAN) 40 MG tablet Take 40 mg by mouth daily.        Marland Kitchen warfarin (COUMADIN) 2.5 MG tablet Take 2.5 mg by mouth. Takes 5 mg(2 tabs) 2 days a week and 1tab (2.5) all other days          Past Medical History  Diagnosis Date  . AF (atrial fibrillation)   . Hyperlipidemia   . Myxomatous mitral valve     post mitral valve replacement 2006  . Other primary cardiomyopathies   . Encounter for long-term (current) use of anticoagulants   . CHF NYHA class III     ROS:   All systems reviewed and negative except as noted in the HPI.   Past  Surgical History  Procedure Date  . S/p icd (guidant vitality icd model #t175), i mplanted 01/23/2007   . Hemorroidectomy   . Tonsillectomy   . Mitral valve replacement 2006 (cow valve)   . Implantable defebrillator      No family history on file.   History   Social History  . Marital Status: Widowed    Spouse Name: N/A    Number of Children: N/A  . Years of Education: N/A   Occupational History  . retired   . lives in [place]    Social History Main Topics  . Smoking status: Former Smoker    Quit date: 06/01/1951  . Smokeless tobacco: Not on file  . Alcohol Use: No  . Drug Use: No  . Sexually Active: Not on file   Other Topics Concern  . Not on file   Social History Narrative  . No narrative on file     BP 115/57  Pulse 72  Resp 16  Ht 5\' 2"  (1.575 m)  Wt 131 lb (59.421 kg)  BMI 23.96 kg/m2  Physical Exam:  Chronically ill appearing NAD HEENT: Unremarkable Neck:  No JVD, no thyromegally Lymphatics:  No adenopathy Back:  No CVA tenderness Lungs:  Clear with decreased breath sounds through out. No wheezes. HEART:  Regular rate rhythm, no murmurs, no rubs, no clicks Abd:  Flat, positive bowel sounds, no organomegally, no rebound, no guarding Ext:  2 plus pulses, no edema, no cyanosis, no clubbing Skin:  No rashes no nodules Neuro:  CN II through XII intact, motor grossly intact  DEVICE  Normal device function.  See PaceArt for details.    Assess/Plan:

## 2010-11-26 NOTE — Assessment & Plan Note (Signed)
Her device is working normally. Will recheck in several months. 

## 2010-11-26 NOTE — Assessment & Plan Note (Signed)
Her symptoms remain class 2-3. They are multifactorial. I have encouraged her to maintain a low sodium diet.

## 2010-11-26 NOTE — Assessment & Plan Note (Signed)
Her ventricular rate appears to be well controlled. She will continue her current meds. 

## 2010-12-04 ENCOUNTER — Ambulatory Visit (INDEPENDENT_AMBULATORY_CARE_PROVIDER_SITE_OTHER): Payer: Medicare PPO | Admitting: Internal Medicine

## 2010-12-04 DIAGNOSIS — I4891 Unspecified atrial fibrillation: Secondary | ICD-10-CM

## 2011-01-08 ENCOUNTER — Ambulatory Visit: Payer: Medicare PPO

## 2011-01-15 ENCOUNTER — Ambulatory Visit (INDEPENDENT_AMBULATORY_CARE_PROVIDER_SITE_OTHER): Payer: Medicare PPO | Admitting: Internal Medicine

## 2011-01-15 ENCOUNTER — Encounter: Payer: Self-pay | Admitting: Internal Medicine

## 2011-01-15 VITALS — BP 120/70 | HR 72 | Temp 98.0°F | Resp 16 | Ht 62.0 in | Wt 133.0 lb

## 2011-01-15 DIAGNOSIS — I4891 Unspecified atrial fibrillation: Secondary | ICD-10-CM

## 2011-01-15 DIAGNOSIS — I5022 Chronic systolic (congestive) heart failure: Secondary | ICD-10-CM

## 2011-01-15 DIAGNOSIS — E038 Other specified hypothyroidism: Secondary | ICD-10-CM

## 2011-01-15 DIAGNOSIS — R350 Frequency of micturition: Secondary | ICD-10-CM

## 2011-01-15 LAB — POCT URINALYSIS DIPSTICK
Bilirubin, UA: NEGATIVE
Blood, UA: NEGATIVE
Glucose, UA: NEGATIVE
Spec Grav, UA: 1.015
Urobilinogen, UA: 0.2

## 2011-01-15 LAB — POCT INR: INR: 1.8

## 2011-01-15 LAB — T3, FREE: T3, Free: 2.7 pg/mL (ref 2.3–4.2)

## 2011-01-15 LAB — T4, FREE: Free T4: 1.07 ng/dL (ref 0.60–1.60)

## 2011-01-15 NOTE — Progress Notes (Signed)
Addended by: Bonnye Fava on: 01/15/2011 10:44 AM   Modules accepted: Orders

## 2011-01-15 NOTE — Progress Notes (Signed)
Subjective:    Patient ID: Mary Arellano, female    DOB: May 28, 1927, 77 y.o.   MRN: [id number]  HPI  Patient is a 77 year old white female who is followed for irregular heart rhythm atrial fibrillation hypothyroidism hypertension as well as chronic urinary tract infections.  The patient states that she has some mild urinary frequency and incontinence this does not have any burning or dysuria at this time.  She's had frequent urinary tract infections in the past she has not noticed any new shortness of breath the swelling in her lower extremities she has gained weight but states this is due to better eating habits she does not appear to be any distress or have any symptoms of CHF reported by her daughter or by the facility which she resides.    Review of Systems  Unable to perform ROS Constitutional: Negative for activity change, appetite change and fatigue.       Elderly white female in no apparent distress  HENT: Negative for ear pain, congestion, neck pain, postnasal drip and sinus pressure.   Eyes: Negative for redness and visual disturbance.  Respiratory: Negative for cough, shortness of breath and wheezing.   Gastrointestinal: Negative for abdominal pain and abdominal distention.  Genitourinary: Negative for dysuria, frequency and menstrual problem.  Musculoskeletal: Negative for myalgias, joint swelling and arthralgias.  Skin: Negative for rash and wound.  Neurological: Negative for dizziness, weakness and headaches.  Hematological: Negative for adenopathy. Does not bruise/bleed easily.  Psychiatric/Behavioral: Negative for sleep disturbance and decreased concentration.      Past Medical History  Diagnosis Date  . AF (atrial fibrillation)   . Hyperlipidemia   . Myxomatous mitral valve     post mitral valve replacement 2006  . Other primary cardiomyopathies   . Encounter for long-term (current) use of anticoagulants   . CHF NYHA class III    Past Surgical History   Procedure Date  . S/p icd (guidant vitality icd model #t175), i mplanted 01/23/2007   . Hemorroidectomy   . Tonsillectomy   . Mitral valve replacement 2006 (cow valve)   . Implantable defebrillator     reports that she quit smoking about 59 years ago. She does not have any smokeless tobacco history on file. She reports that she does not drink alcohol or use illicit drugs. family history is not on file. Allergies  Allergen Reactions  . Simvastatin     REACTION: liver function elevation    Objective:   Physical Exam  Nursing note and vitals reviewed. Constitutional: She is oriented to person, place, and time. She appears well-developed and well-nourished. No distress.  HENT:  Head: Normocephalic and atraumatic.  Right Ear: External ear normal.  Left Ear: External ear normal.  Nose: Nose normal.  Mouth/Throat: Oropharynx is clear and moist.  Eyes: Conjunctivae and EOM are normal. Pupils are equal, round, and reactive to light.  Neck: Normal range of motion. Neck supple. No JVD present. No tracheal deviation present. No thyromegaly present.  Cardiovascular: Intact distal pulses.   Murmur heard.      Essentially regular rate with a 2/6 systolic murmur There is trace peripheral edema   Pulmonary/Chest: Effort normal and breath sounds normal. She has no wheezes. She exhibits no tenderness.  Abdominal: Soft. Bowel sounds are normal.  Musculoskeletal: Normal range of motion. She exhibits no edema and no tenderness.  Lymphadenopathy:    She has no cervical adenopathy.  Neurological: She is alert and oriented to person, place, and time. She has  normal reflexes. No cranial nerve deficit.  Skin: Skin is warm and dry. She is not diaphoretic.  Psychiatric: She has a normal mood and affect. Her behavior is normal.          Assessment & Plan:  Patient doing remarkably well.  She easily passes a clock test and interpretation of proverbs.  Since blood pressure is stable her heart  rate is stable she's doing well from the standpoint of congestive heart failure with no indications of volume overload at this time.  We will obtain a urinalysis because of her history of chronic UTIs and her incontinence.  Will also obtain a TSH T3 and T4 were monitoring her hypothyroidism.  Otherwise she has a followup visit with a cardiologist plan and we will see her in 4 months time A protime will be drawn today

## 2011-02-15 ENCOUNTER — Ambulatory Visit (INDEPENDENT_AMBULATORY_CARE_PROVIDER_SITE_OTHER): Payer: Medicare PPO | Admitting: Internal Medicine

## 2011-02-15 DIAGNOSIS — I4891 Unspecified atrial fibrillation: Secondary | ICD-10-CM

## 2011-02-15 LAB — POCT INR: INR: 1.6

## 2011-02-15 NOTE — Patient Instructions (Signed)
Same dose, 5 mg on mondays,wednesdays and fridays, 2.5 mg on other days Check in 3 weeks By Dr.K

## 2011-02-22 ENCOUNTER — Encounter: Payer: Medicare PPO | Admitting: *Deleted

## 2011-02-23 ENCOUNTER — Telehealth: Payer: Self-pay | Admitting: Internal Medicine

## 2011-02-23 NOTE — Telephone Encounter (Signed)
[employer] called re: Attending Physician Form that was rcvd from Dr Lovell Sheehan. The form was not completed. [employer] is refaxing form to get info needed. Pls include dx codes and date of pts last ov, then fax back to the # on the form.

## 2011-02-26 ENCOUNTER — Telehealth: Payer: Self-pay | Admitting: Internal Medicine

## 2011-02-26 NOTE — Telephone Encounter (Signed)
[employer] says that the Attending Physicians Statement was incomplete. The form has been faxed back twice, in order to be completed. Pls call asap. This is the 3rd request.

## 2011-03-01 NOTE — Telephone Encounter (Signed)
[employer] called back again to check on status of Attending Physicians Statement. This is the 4th attempt to get this form completed. Req a call back asap.

## 2011-03-02 LAB — POCT I-STAT, CHEM 8
Calcium, Ion: 1.12
Chloride: 95 — ABNORMAL LOW
Glucose, Bld: 109 — ABNORMAL HIGH
HCT: 36
Hemoglobin: 12.2
TCO2: 30

## 2011-03-02 LAB — DIGOXIN LEVEL: Digoxin Level: 1.3

## 2011-03-03 ENCOUNTER — Other Ambulatory Visit: Payer: Self-pay | Admitting: Internal Medicine

## 2011-03-03 ENCOUNTER — Ambulatory Visit (INDEPENDENT_AMBULATORY_CARE_PROVIDER_SITE_OTHER): Payer: Medicare PPO | Admitting: Cardiovascular Disease

## 2011-03-03 ENCOUNTER — Ambulatory Visit: Payer: Medicare PPO | Admitting: Cardiovascular Disease

## 2011-03-03 ENCOUNTER — Encounter (INDEPENDENT_AMBULATORY_CARE_PROVIDER_SITE_OTHER): Payer: Medicare PPO | Admitting: *Deleted

## 2011-03-03 ENCOUNTER — Encounter: Payer: Medicare PPO | Admitting: *Deleted

## 2011-03-03 ENCOUNTER — Encounter: Payer: Self-pay | Admitting: Cardiovascular Disease

## 2011-03-03 ENCOUNTER — Encounter: Payer: Self-pay | Admitting: Internal Medicine

## 2011-03-03 DIAGNOSIS — I428 Other cardiomyopathies: Secondary | ICD-10-CM

## 2011-03-03 DIAGNOSIS — I5022 Chronic systolic (congestive) heart failure: Secondary | ICD-10-CM

## 2011-03-03 DIAGNOSIS — I4891 Unspecified atrial fibrillation: Secondary | ICD-10-CM

## 2011-03-03 LAB — ICD DEVICE OBSERVATION
BATTERY VOLTAGE: 2.72 V
BRDY-0002RV: 40 {beats}/min
HV IMPEDENCE: 44 Ohm
RV LEAD AMPLITUDE: 25 mv
RV LEAD IMPEDENCE ICD: 811 Ohm
TZAT-0001FASTVT: 1
TZAT-0001FASTVT: 2
TZAT-0002FASTVT: NEGATIVE
TZAT-0004FASTVT: 8
TZAT-0005SLOWVT: 81 pct
TZAT-0012SLOWVT: 200 ms
TZAT-0012SLOWVT: 200 ms
TZAT-0013FASTVT: 1
TZAT-0013SLOWVT: 2
TZAT-0013SLOWVT: 2
TZAT-0018FASTVT: NEGATIVE
TZAT-0018SLOWVT: NEGATIVE
TZAT-0018SLOWVT: NEGATIVE
TZAT-0019FASTVT: 7.5 V
TZAT-0019SLOWVT: 7.5 V
TZAT-0019SLOWVT: 7.5 V
TZAT-0020SLOWVT: 1 ms
TZON-0003FASTVT: 300 ms
TZON-0003SLOWVT: 333 ms
TZON-0004SLOWVT: 2.5
TZON-0005FASTVT: 1
TZST-0001FASTVT: 3
TZST-0001FASTVT: 4
TZST-0001FASTVT: 6
TZST-0001SLOWVT: 5
TZST-0003FASTVT: 31 J
TZST-0003FASTVT: 31 J
TZST-0003SLOWVT: 23 J
TZST-0003SLOWVT: 31 J
VENTRICULAR PACING ICD: 0 pct

## 2011-03-03 NOTE — Assessment & Plan Note (Signed)
The patient's exam is stable and there is no evidence of volume overload. She is tolerating medical therapy with Diovan, long-acting metoprolol, and Lanoxin. Will continue her current medical program without changes.

## 2011-03-03 NOTE — Progress Notes (Signed)
HPI: This is an 77 year old woman presenting for followup evaluation. She has chronic systolic heart failure.  The patient developed severe LV dysfunction after undergoing mitral valve replacement in 2006 for treatment of a myxomatous mitral valve with severe MR. Her left ventricular ejection fraction is less than 20%.  She had a single-chamber ICD placed in 2008. She has done remarkably well over the past few years and has noted significant improvement since moving into an assisted living arrangement at [facility].  She is asymptomatic with rest and has mild dyspnea with exertion but reports no change over the last year. She denies chest pain or pressure. She denies lower extremity edema, lightheadedness, palpitations, or syncope. She notes that she has had significant weight gain over the last 12 months.  Outpatient Encounter Prescriptions as of 03/03/2011  Medication Sig Dispense Refill  . digoxin (LANOXIN) 0.125 MG tablet Take 125 mcg by mouth every other day.        . furosemide (LASIX) 40 MG tablet Take 40 mg by mouth daily.        Marland Kitchen levothyroxine (SYNTHROID, LEVOTHROID) 25 MCG tablet Take 25 mcg by mouth daily.        . metoprolol succinate (TOPROL-XL) 25 MG 24 hr tablet Take 25 mg by mouth daily.        . multivitamin (THERAGRAN) per tablet Take 1 tablet by mouth daily.        . nitrofurantoin (MACRODANTIN) 50 MG capsule Take 50 mg by mouth daily.        . Nutritional Supplements (ENSURE HIGH PROTEIN PO) Take by mouth daily.        . OMEGA-3 KRILL OIL 300 MG CAPS Take by mouth 2 (two) times daily.        . sodium chloride (OCEAN) 0.65 % nasal spray 1 spray by Nasal route as needed.        . valsartan (DIOVAN) 40 MG tablet Take 40 mg by mouth daily.        Marland Kitchen warfarin (COUMADIN) 2.5 MG tablet Take 2.5 mg by mouth. Takes 5 mg(2 tabs) 2 days a week and 1tab (2.5) all other days       . acetaminophen (TYLENOL) 325 MG tablet Take 650 mg by mouth every 6 (six) hours as needed.        Marland Kitchen  amoxicillin (AMOXIL) 500 MG tablet Take 500 mg by mouth. 4 tabs 1 hours prior to dental work         Allergies  Allergen Reactions  . Simvastatin     REACTION: liver function elevation    Past Medical History  Diagnosis Date  . AF (atrial fibrillation)   . Hyperlipidemia   . Myxomatous mitral valve     post mitral valve replacement 2006  . Other primary cardiomyopathies   . Encounter for long-term (current) use of anticoagulants   . CHF NYHA class III     ROS: Negative except as per HPI  BP 120/68  Pulse 77  Ht 5' 2.5" (1.588 m)  Wt 133 lb (60.328 kg)  BMI 23.94 kg/m2  PHYSICAL EXAM: Pt is alert and oriented, NAD HEENT: normal Neck: JVP - normal, carotids 2+= without bruits Lungs: CTA bilaterally CV: RRR without murmur or gallop Abd: soft, NT, Positive BS, no hepatomegaly Ext: no C/C/E, distal pulses intact and equal Skin: warm/dry no rash  EKG: normal sinus rhythm 77 beats per minute, nonspecific ST and T wave abnormality. Unchanged from previous tracings.  ASSESSMENT AND PLAN:

## 2011-03-03 NOTE — Patient Instructions (Signed)
The current medical regimen is effective;  continue present plan and medications.  Follow up in 6 months with Dr Excell Seltzer.  You will receive a letter in the mail 2 months before you are due.  Please call us when you receive this letter to schedule your follow up appointment.

## 2011-03-03 NOTE — Assessment & Plan Note (Signed)
She is maintaining sinus rhythm. Continue with anticoagulation using warfarin.

## 2011-03-04 NOTE — Telephone Encounter (Signed)
Form received and completed and faxed

## 2011-03-04 NOTE — Telephone Encounter (Signed)
[employer] calling back again and would like to speak only to [facility] about the status of the paperwork for this pt. Please call back at [phone].  Leave a message. She will continue to call if no response. Thanks.

## 2011-03-05 ENCOUNTER — Ambulatory Visit (INDEPENDENT_AMBULATORY_CARE_PROVIDER_SITE_OTHER): Payer: Medicare PPO | Admitting: Internal Medicine

## 2011-03-05 DIAGNOSIS — Z23 Encounter for immunization: Secondary | ICD-10-CM

## 2011-03-05 DIAGNOSIS — I4891 Unspecified atrial fibrillation: Secondary | ICD-10-CM

## 2011-03-05 NOTE — Telephone Encounter (Signed)
Faxed completed form on 10-4-at 2-02 with transmission ok at fax machine

## 2011-03-05 NOTE — Patient Instructions (Signed)
  Latest dosing instructions   Total Sun Mon Tue Wed Thu Fri Sat   27.5 5 mg 2.5 mg 5 mg 2.5 mg 5 mg 2.5 mg 5 mg    (5 mg1) (5 mg0.5) (5 mg1) (5 mg0.5) (5 mg1) (5 mg0.5) (5 mg1)        

## 2011-03-05 NOTE — Progress Notes (Signed)
Addended by: Burnett Kanaris A on: 03/05/2011 09:23 AM   Modules accepted: Orders

## 2011-03-08 LAB — BASIC METABOLIC PANEL
BUN: 12
BUN: 19
CO2: 30
Calcium: 8.6
Chloride: 104
GFR calc non Af Amer: >60
GFR calc non Af Amer: >60
Glucose, Bld: 82
Glucose, Bld: 84
Potassium: 4.1
Potassium: 4.5
Sodium: 137

## 2011-03-08 LAB — DIGOXIN LEVEL
Digoxin Level: 1.8
Digoxin Level: 3

## 2011-03-08 LAB — URINE CULTURE

## 2011-03-08 LAB — CBC
HCT: 38.2
Hemoglobin: 12.9
MCHC: 33.7
MCV: 92.4
Platelets: 131 — ABNORMAL LOW
RDW: 14.6

## 2011-03-08 LAB — URINALYSIS, ROUTINE W REFLEX MICROSCOPIC
Bilirubin Urine: NEGATIVE
Hgb urine dipstick: NEGATIVE
Nitrite: NEGATIVE
Specific Gravity, Urine: 1.014
Urobilinogen, UA: 0.2
pH: 6

## 2011-03-08 LAB — PROTIME-INR
INR: 2.9 — ABNORMAL HIGH
Prothrombin Time: 31.3 — ABNORMAL HIGH

## 2011-03-09 LAB — CBC
HCT: 44.6
MCHC: 34.5
MCHC: 34.9
MCV: 90.7
MCV: 90.9
Platelets: 155
Platelets: 160
RDW: 14.4

## 2011-03-09 LAB — DIFFERENTIAL
Basophils Absolute: 0
Lymphocytes Relative: 14
Lymphs Abs: 1.2
Monocytes Absolute: 0.8
Monocytes Relative: 9
Neutro Abs: 6.9

## 2011-03-09 LAB — URINE MICROSCOPIC-ADD ON

## 2011-03-09 LAB — BASIC METABOLIC PANEL
BUN: 28 — ABNORMAL HIGH
CO2: 26
Calcium: 8.8
Chloride: 96
Creatinine, Ser: 1.48 — ABNORMAL HIGH

## 2011-03-09 LAB — URINALYSIS, ROUTINE W REFLEX MICROSCOPIC
Glucose, UA: NEGATIVE
Protein, ur: NEGATIVE
Specific Gravity, Urine: 1.01
Urobilinogen, UA: 1

## 2011-03-09 LAB — COMPREHENSIVE METABOLIC PANEL
AST: 48 — ABNORMAL HIGH
Albumin: 3.6
BUN: 33 — ABNORMAL HIGH
Calcium: 9.3
Creatinine, Ser: 1.83 — ABNORMAL HIGH
GFR calc Af Amer: 32 — ABNORMAL LOW
Total Bilirubin: 1.6 — ABNORMAL HIGH
Total Protein: 7.1

## 2011-03-09 LAB — PROTIME-INR: Prothrombin Time: 66.5 — ABNORMAL HIGH

## 2011-03-11 LAB — BASIC METABOLIC PANEL
BUN: 19
BUN: 22
BUN: 23
BUN: 24 — ABNORMAL HIGH
CO2: 24
CO2: 27
CO2: 31
CO2: 32
Calcium: 8.5
Calcium: 8.7
Calcium: 8.7
Calcium: 8.9
Calcium: 9
Chloride: 100
Chloride: 101
Chloride: 102
Chloride: 94 — ABNORMAL LOW
Chloride: 95 — ABNORMAL LOW
Creatinine, Ser: 0.77
Creatinine, Ser: 0.83
Creatinine, Ser: 0.99
Creatinine, Ser: 1
Creatinine, Ser: 1.19
GFR calc Af Amer: 53 — ABNORMAL LOW
GFR calc Af Amer: >60
GFR calc Af Amer: >60
GFR calc Af Amer: >60
GFR calc Af Amer: >60
GFR calc Af Amer: >60
GFR calc Af Amer: >60
GFR calc non Af Amer: 44 — ABNORMAL LOW
GFR calc non Af Amer: 50 — ABNORMAL LOW
GFR calc non Af Amer: 54 — ABNORMAL LOW
GFR calc non Af Amer: 55 — ABNORMAL LOW
GFR calc non Af Amer: 60 — ABNORMAL LOW
GFR calc non Af Amer: 60 — ABNORMAL LOW
Glucose, Bld: 74
Glucose, Bld: 81
Potassium: 3.9
Potassium: 4.5
Potassium: 4.6
Potassium: 4.8
Potassium: 4.9
Potassium: 5
Sodium: 130 — ABNORMAL LOW
Sodium: 131 — ABNORMAL LOW
Sodium: 132 — ABNORMAL LOW
Sodium: 132 — ABNORMAL LOW
Sodium: 135

## 2011-03-11 LAB — COMPREHENSIVE METABOLIC PANEL
Albumin: 3.2 — ABNORMAL LOW
BUN: 25 — ABNORMAL HIGH
Calcium: 8.9
Creatinine, Ser: 1.25 — ABNORMAL HIGH
Total Bilirubin: 1.5 — ABNORMAL HIGH
Total Protein: 6.1

## 2011-03-11 LAB — POCT I-STAT 3, VENOUS BLOOD GAS (G3P V)
Acid-Base Excess: 1
Acid-Base Excess: 2
Acid-Base Excess: 2
Acid-Base Excess: 3 — ABNORMAL HIGH
Acid-Base Excess: 3 — ABNORMAL HIGH
Acid-Base Excess: 5 — ABNORMAL HIGH
Bicarbonate: 26.8 — ABNORMAL HIGH
Bicarbonate: 27.5 — ABNORMAL HIGH
Bicarbonate: 28.2 — ABNORMAL HIGH
Bicarbonate: 28.2 — ABNORMAL HIGH
Bicarbonate: 28.6 — ABNORMAL HIGH
Bicarbonate: 30.1 — ABNORMAL HIGH
O2 Saturation: 67
O2 Saturation: 68
O2 Saturation: 68
O2 Saturation: 69
O2 Saturation: 70
O2 Saturation: 70
Operator id: 218461
Operator id: 218461
Operator id: 218461
Operator id: 218461
Operator id: 218461
Operator id: 218461
TCO2: 28
TCO2: 29
TCO2: 30
TCO2: 30
TCO2: 30
TCO2: 32
pCO2, Ven: 44.1 — ABNORMAL LOW
pCO2, Ven: 45.2
pCO2, Ven: 45.2
pCO2, Ven: 45.6
pCO2, Ven: 45.6
pCO2, Ven: 46.2
pH, Ven: 7.381 — ABNORMAL HIGH
pH, Ven: 7.394 — ABNORMAL HIGH
pH, Ven: 7.4 — ABNORMAL HIGH
pH, Ven: 7.402 — ABNORMAL HIGH
pH, Ven: 7.409 — ABNORMAL HIGH
pH, Ven: 7.428 — ABNORMAL HIGH
pO2, Ven: 35
pO2, Ven: 35
pO2, Ven: 35
pO2, Ven: 36
pO2, Ven: 37
pO2, Ven: 37

## 2011-03-11 LAB — B-NATRIURETIC PEPTIDE (CONVERTED LAB)
Pro B Natriuretic peptide (BNP): >3200 — ABNORMAL HIGH
Pro B Natriuretic peptide (BNP): >3200 — ABNORMAL HIGH
Pro B Natriuretic peptide (BNP): >3200 — ABNORMAL HIGH

## 2011-03-11 LAB — I-STAT 8, (EC8 V) (CONVERTED LAB)
Acid-Base Excess: 3 — ABNORMAL HIGH
BUN: 28 — ABNORMAL HIGH
Bicarbonate: 28.4 — ABNORMAL HIGH
Chloride: 95 — ABNORMAL LOW
Glucose, Bld: 87
HCT: 47 — ABNORMAL HIGH
Hemoglobin: 16 — ABNORMAL HIGH
Operator id: 218461
Potassium: 4.8
Sodium: 132 — ABNORMAL LOW
TCO2: 30
pCO2, Ven: 47.2
pH, Ven: 7.388 — ABNORMAL HIGH

## 2011-03-11 LAB — PROTIME-INR
INR: 1.7 — ABNORMAL HIGH
INR: 1.9 — ABNORMAL HIGH
INR: 1.9 — ABNORMAL HIGH
Prothrombin Time: 19.9 — ABNORMAL HIGH
Prothrombin Time: 20.1 — ABNORMAL HIGH
Prothrombin Time: 20.3 — ABNORMAL HIGH
Prothrombin Time: 22.2 — ABNORMAL HIGH

## 2011-03-11 LAB — DIFFERENTIAL
Basophils Absolute: 0
Lymphocytes Relative: 10 — ABNORMAL LOW
Monocytes Absolute: 0.5
Monocytes Relative: 8
Neutro Abs: 5.7

## 2011-03-11 LAB — CBC
HCT: 47.3 — ABNORMAL HIGH
HCT: 51.7 — ABNORMAL HIGH
Hemoglobin: 17.3 — ABNORMAL HIGH
MCHC: 33.2
MCHC: 33.5
MCV: 94
MCV: 94.4
Platelets: 124 — ABNORMAL LOW
Platelets: 143 — ABNORMAL LOW
RBC: 5.52 — ABNORMAL HIGH
RDW: 14.2 — ABNORMAL HIGH
RDW: 14.3 — ABNORMAL HIGH
WBC: 5.6
WBC: 7.4

## 2011-03-11 LAB — T4, FREE: Free T4: 1.62

## 2011-03-11 LAB — TSH: TSH: 3.668

## 2011-03-11 LAB — APTT: aPTT: 33

## 2011-03-26 ENCOUNTER — Ambulatory Visit: Payer: Medicare PPO

## 2011-03-29 ENCOUNTER — Ambulatory Visit (INDEPENDENT_AMBULATORY_CARE_PROVIDER_SITE_OTHER): Payer: Medicare PPO | Admitting: Internal Medicine

## 2011-03-29 DIAGNOSIS — I4891 Unspecified atrial fibrillation: Secondary | ICD-10-CM

## 2011-03-29 LAB — POCT INR: INR: 1.9

## 2011-03-29 NOTE — Patient Instructions (Signed)
  Latest dosing instructions   Total Sun Mon Tue Wed Thu Fri Sat   27.5 5 mg 2.5 mg 5 mg 2.5 mg 5 mg 2.5 mg 5 mg    (5 mg1) (5 mg0.5) (5 mg1) (5 mg0.5) (5 mg1) (5 mg0.5) (5 mg1)        

## 2011-03-30 ENCOUNTER — Other Ambulatory Visit: Payer: Self-pay | Admitting: *Deleted

## 2011-04-28 ENCOUNTER — Telehealth: Payer: Self-pay | Admitting: *Deleted

## 2011-04-28 MED ORDER — ACETAMINOPHEN 500 MG PO TABS: ORAL_TABLET | ORAL | Status: DC

## 2011-04-28 NOTE — Telephone Encounter (Signed)
Daughter is calling because the patient is having left hip pain which increases with walking.  She does have an appointment next week with Dr Lovell Sheehan.  She would like to know which OTC is safe to try because the patient is on coumadin.

## 2011-04-28 NOTE — Telephone Encounter (Signed)
Order faxed and daughter is aware

## 2011-04-28 NOTE — Telephone Encounter (Signed)
Per dr Lovell Sheehan - m ay take tylenol 500 2 every 4-6 hours prn pain

## 2011-05-04 ENCOUNTER — Ambulatory Visit (INDEPENDENT_AMBULATORY_CARE_PROVIDER_SITE_OTHER): Payer: Medicare PPO | Admitting: Internal Medicine

## 2011-05-04 ENCOUNTER — Encounter: Payer: Self-pay | Admitting: Internal Medicine

## 2011-05-04 DIAGNOSIS — R413 Other amnesia: Secondary | ICD-10-CM

## 2011-05-04 DIAGNOSIS — I5022 Chronic systolic (congestive) heart failure: Secondary | ICD-10-CM

## 2011-05-04 DIAGNOSIS — M25559 Pain in unspecified hip: Secondary | ICD-10-CM

## 2011-05-04 DIAGNOSIS — M25552 Pain in left hip: Secondary | ICD-10-CM | POA: Insufficient documentation

## 2011-05-04 DIAGNOSIS — I4891 Unspecified atrial fibrillation: Secondary | ICD-10-CM

## 2011-05-04 IMAGING — CR DG CHEST 1V PORT
1 series · 1 of 1 positions shown · non-contrast
Comparison: 12/09/2008

CLINICAL DATA: Syncope.

PORTABLE CHEST - 1 VIEW

[AP]
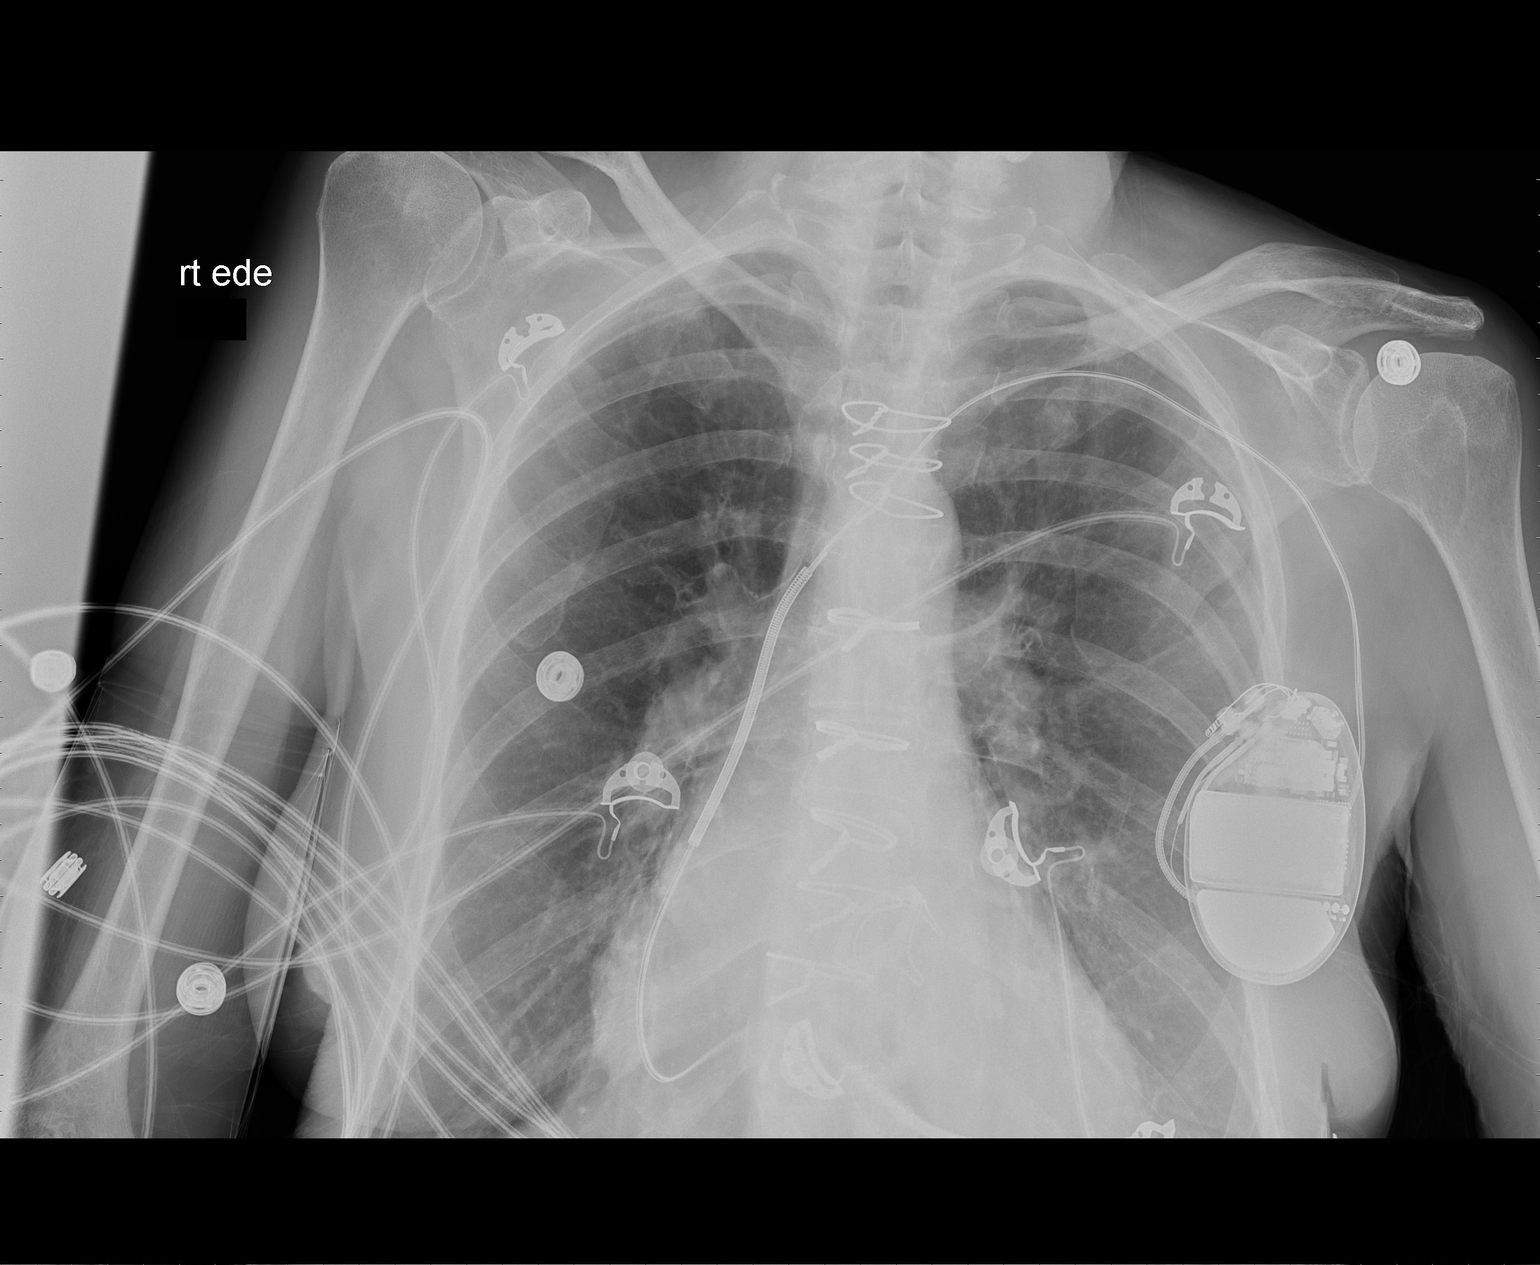

[1 of 1 positions shown; findings below may reference images not displayed]

FINDINGS: AICD noted.  There is cardiomegaly.  Prosthetic mitral
valve noted.  The left hemidiaphragm is inadvertently clipped.

No pulmonary edema identified.  No discrete airspace opacity is
noted.
IMPRESSION: 1.  Cardiomegaly with AICD and mitral valve prosthesis in place.
2.  The lungs appear clear.

## 2011-05-04 MED ORDER — METHYLPREDNISOLONE ACETATE PF 40 MG/ML IJ SUSP: 40.0000 mg | Freq: Once | INTRAMUSCULAR | Status: DC

## 2011-05-04 NOTE — Progress Notes (Signed)
  Subjective:    Patient ID: Mary Arellano, female    DOB: April 30, 1927, 77 y.o.   MRN: [id number]  HPI Has gained weight approximately 20 pounds over the past year.  Her clothes are tight and she is uncomfortable with weight gain. She has developed left hip pain. This is interfering with her gait. She has stable atrial fibrillation stable hypothyroidism and stable hypertension.    Review of Systems  Constitutional: Negative for activity change, appetite change and fatigue.  HENT: Negative for ear pain, congestion, neck pain, postnasal drip and sinus pressure.   Eyes: Negative for redness and visual disturbance.  Respiratory: Negative for cough, shortness of breath and wheezing.   Gastrointestinal: Negative for abdominal pain and abdominal distention.  Genitourinary: Negative for dysuria, frequency and menstrual problem.  Musculoskeletal: Negative for myalgias, joint swelling and arthralgias.  Skin: Negative for rash and wound.  Neurological: Negative for dizziness, weakness and headaches.  Hematological: Negative for adenopathy. Does not bruise/bleed easily.  Psychiatric/Behavioral: Negative for sleep disturbance and decreased concentration.       Objective:   Physical Exam  Nursing note and vitals reviewed. Constitutional: She is oriented to person, place, and time. She appears well-developed and well-nourished. No distress.  HENT:  Head: Normocephalic and atraumatic.  Right Ear: External ear normal.  Left Ear: External ear normal.  Nose: Nose normal.  Mouth/Throat: Oropharynx is clear and moist.  Eyes: Conjunctivae and EOM are normal. Pupils are equal, round, and reactive to light.  Neck: Normal range of motion. Neck supple. No JVD present. No tracheal deviation present. No thyromegaly present.  Cardiovascular: Normal rate, regular rhythm, normal heart sounds and intact distal pulses.   No murmur heard. Pulmonary/Chest: Effort normal and breath sounds normal. She has  no wheezes. She exhibits no tenderness.  Abdominal: Soft. Bowel sounds are normal.  Musculoskeletal: She exhibits tenderness. She exhibits no edema.       Left hip tenderness over the greater trochanter  Lymphadenopathy:    She has no cervical adenopathy.  Neurological: She is alert and oriented to person, place, and time. She has normal reflexes. No cranial nerve deficit.  Skin: Skin is warm and dry. She is not diaphoretic.  Psychiatric: She has a normal mood and affect. Her behavior is normal.          Assessment & Plan:  Weight loss exercize We'll inject the left hip  Informed consent was obtained. The patient's left hip was prepped with Betadine in a sterile manner and 40 mg of Depo-Medrol with 1/2 cc of 1% lidocaine without epinephrine was inserted into the tendon. The patient tolerated the procedure well. Obtained protime today for Coumadin management Blood pressure is stable  No evidence of CHF  Mental status is stable

## 2011-05-04 NOTE — Patient Instructions (Addendum)
The patient is instructed to continue all medications as prescribed. Schedule followup with check out clerk upon leaving the clinic Goal is 10 pound weight loss!!!!    Latest dosing instructions   Total Sun Mon Tue Wed Thu Fri Sat   27.5 5 mg 2.5 mg 5 mg 2.5 mg 5 mg 2.5 mg 5 mg    (5 mg1) (5 mg0.5) (5 mg1) (5 mg0.5) (5 mg1) (5 mg0.5) (5 mg1)

## 2011-05-04 NOTE — Assessment & Plan Note (Signed)
Informed consent was obtained. The patient's hip was prepped with Betadine in a sterile manner and 40 mg of Depo-Medrol with 1/2 cc of 1% lidocaine without epinephrine was inserted into the tendon and joint area at the greater trochator. The patient tolerated the procedure well.

## 2011-05-07 ENCOUNTER — Ambulatory Visit: Payer: Medicare PPO

## 2011-06-02 ENCOUNTER — Ambulatory Visit (INDEPENDENT_AMBULATORY_CARE_PROVIDER_SITE_OTHER): Payer: Medicare PPO | Admitting: *Deleted

## 2011-06-02 ENCOUNTER — Encounter: Payer: Self-pay | Admitting: Internal Medicine

## 2011-06-02 ENCOUNTER — Telehealth: Payer: Self-pay

## 2011-06-02 DIAGNOSIS — I5022 Chronic systolic (congestive) heart failure: Secondary | ICD-10-CM

## 2011-06-02 DIAGNOSIS — I4891 Unspecified atrial fibrillation: Secondary | ICD-10-CM

## 2011-06-02 DIAGNOSIS — Z95810 Presence of automatic (implantable) cardiac defibrillator: Secondary | ICD-10-CM

## 2011-06-02 LAB — ICD DEVICE OBSERVATION
BRDY-0002RV: 40 {beats}/min
CHARGE TIME: 18.4 s
RV LEAD IMPEDENCE ICD: 785 Ohm
TZAT-0004FASTVT: 8
TZAT-0004SLOWVT: 8
TZAT-0005FASTVT: 81 pct
TZAT-0005SLOWVT: 81 pct
TZAT-0005SLOWVT: 81 pct
TZAT-0012FASTVT: 200 ms
TZAT-0012SLOWVT: 200 ms
TZAT-0012SLOWVT: 200 ms
TZAT-0013FASTVT: 1
TZAT-0013SLOWVT: 2
TZAT-0018FASTVT: NEGATIVE
TZAT-0018FASTVT: NEGATIVE
TZAT-0018SLOWVT: NEGATIVE
TZAT-0019SLOWVT: 7.5 V
TZAT-0020SLOWVT: 1 ms
TZON-0003SLOWVT: 333 ms
TZON-0005FASTVT: 1
TZST-0001FASTVT: 3
TZST-0001FASTVT: 5
TZST-0001FASTVT: 6
TZST-0001SLOWVT: 4
TZST-0001SLOWVT: 5
TZST-0001SLOWVT: 6
TZST-0003FASTVT: 23 J
TZST-0003FASTVT: 31 J
TZST-0003FASTVT: 31 J
TZST-0003SLOWVT: 14 J
TZST-0003SLOWVT: 23 J
TZST-0003SLOWVT: 31 J
VENTRICULAR PACING ICD: 0 pct

## 2011-06-02 NOTE — Telephone Encounter (Signed)
Per dr Lovell Sheehan- may use mucinex fast max - use as directed on bottle

## 2011-06-02 NOTE — Telephone Encounter (Signed)
Pt has congestion and cough and needs an rx sent to [facility] that will help pt break up congestion.  Pls advise.

## 2011-06-02 NOTE — Progress Notes (Signed)
icd check in clinic  

## 2011-06-02 NOTE — Telephone Encounter (Signed)
Faxed to assisted living  

## 2011-06-04 ENCOUNTER — Ambulatory Visit: Payer: Medicare PPO

## 2011-06-04 DIAGNOSIS — Z5181 Encounter for therapeutic drug level monitoring: Secondary | ICD-10-CM

## 2011-06-04 DIAGNOSIS — Z7901 Long term (current) use of anticoagulants: Secondary | ICD-10-CM

## 2011-06-04 DIAGNOSIS — I4891 Unspecified atrial fibrillation: Secondary | ICD-10-CM

## 2011-06-04 NOTE — Patient Instructions (Signed)
  Latest dosing instructions   Total Sun Mon Tue Wed Thu Fri Sat   22.5 2.5 mg 5 mg 2.5 mg 2.5 mg 2.5 mg 5 mg 2.5 mg    (5 mg0.5) (5 mg1) (5 mg0.5) (5 mg0.5) (5 mg0.5) (5 mg1) (5 mg0.5)        

## 2011-07-02 ENCOUNTER — Ambulatory Visit: Payer: Medicare PPO

## 2011-07-02 DIAGNOSIS — Z5181 Encounter for therapeutic drug level monitoring: Secondary | ICD-10-CM

## 2011-07-02 DIAGNOSIS — Z7901 Long term (current) use of anticoagulants: Secondary | ICD-10-CM

## 2011-07-02 DIAGNOSIS — I4891 Unspecified atrial fibrillation: Secondary | ICD-10-CM

## 2011-07-02 NOTE — Patient Instructions (Signed)
  Latest dosing instructions   Total Sun Mon Tue Wed Thu Fri Sat   27.5 5 mg 2.5 mg 5 mg 2.5 mg 5 mg 2.5 mg 5 mg    (5 mg1) (5 mg0.5) (5 mg1) (5 mg0.5) (5 mg1) (5 mg0.5) (5 mg1)        

## 2011-08-04 ENCOUNTER — Encounter: Payer: Self-pay | Admitting: Internal Medicine

## 2011-08-04 ENCOUNTER — Ambulatory Visit (INDEPENDENT_AMBULATORY_CARE_PROVIDER_SITE_OTHER): Payer: Medicare PPO | Admitting: Internal Medicine

## 2011-08-04 VITALS — BP 110/70 | HR 76 | Temp 98.0°F | Resp 16 | Ht 63.0 in | Wt 128.0 lb

## 2011-08-04 DIAGNOSIS — I428 Other cardiomyopathies: Secondary | ICD-10-CM

## 2011-08-04 DIAGNOSIS — I4891 Unspecified atrial fibrillation: Secondary | ICD-10-CM

## 2011-08-04 DIAGNOSIS — E785 Hyperlipidemia, unspecified: Secondary | ICD-10-CM

## 2011-08-04 DIAGNOSIS — R413 Other amnesia: Secondary | ICD-10-CM

## 2011-08-04 DIAGNOSIS — E038 Other specified hypothyroidism: Secondary | ICD-10-CM

## 2011-08-04 DIAGNOSIS — T887XXA Unspecified adverse effect of drug or medicament, initial encounter: Secondary | ICD-10-CM

## 2011-08-04 LAB — LIPID PANEL
HDL: 41 mg/dL (ref >39.00)
VLDL: 27.6 mg/dL (ref 0.0–40.0)

## 2011-08-04 LAB — BASIC METABOLIC PANEL
CO2: 27 mEq/L (ref 19–32)
GFR: 49.41 mL/min — ABNORMAL LOW (ref >60.00)
Glucose, Bld: 127 mg/dL — ABNORMAL HIGH (ref 70–99)
Potassium: 4.6 mEq/L (ref 3.5–5.1)
Sodium: 140 mEq/L (ref 135–145)

## 2011-08-04 LAB — POCT INR: INR: 2.1

## 2011-08-04 LAB — LDL CHOLESTEROL, DIRECT: Direct LDL: 159.6 mg/dL

## 2011-08-04 LAB — HEPATIC FUNCTION PANEL
Bilirubin, Direct: 0.1 mg/dL (ref 0.0–0.3)
Total Bilirubin: 0.4 mg/dL (ref 0.3–1.2)
Total Protein: 7.3 g/dL (ref 6.0–8.3)

## 2011-08-04 NOTE — Patient Instructions (Addendum)
The patient is instructed to continue all medications as prescribed. Schedule followup with check out clerk upon leaving the clinic     Latest dosing instructions   Total Sun Mon Tue Wed Thu Fri Sat   27.5 5 mg 2.5 mg 5 mg 2.5 mg 5 mg 2.5 mg 5 mg    (5 mg1) (5 mg0.5) (5 mg1) (5 mg0.5) (5 mg1) (5 mg0.5) (5 mg1)

## 2011-08-04 NOTE — Progress Notes (Signed)
Subjective:    Patient ID: Mary Arellano, female    DOB: 1926-09-30, 78 y.o.   MRN: [id number]  HPI Patient is an 78 year old female who presents for routine followup of multiple medical problems including a history of atrial fibrillation. She will require a pro time today for Coumadin management. She recently had a viral gastroenteritis with diarrhea that was self-limiting for about 24 hours.  She appears to be moderately dehydrated today with an increased pulse and blood pressure is moderately orthostatic. Generally she is doing well has been stable her medications it is time for monitoring of her thyroid lipid and liver basic metabolic panel as part of our screening for health maintenance   Review of Systems  Constitutional: Negative for activity change, appetite change and fatigue.  HENT: Negative for ear pain, congestion, neck pain, postnasal drip and sinus pressure.   Eyes: Negative for redness and visual disturbance.  Respiratory: Negative for cough, shortness of breath and wheezing.   Gastrointestinal: Positive for diarrhea. Negative for abdominal pain and abdominal distention.  Genitourinary: Negative for dysuria, frequency and menstrual problem.  Musculoskeletal: Positive for myalgias and gait problem. Negative for joint swelling and arthralgias.  Skin: Negative for rash and wound.  Neurological: Negative for dizziness, weakness and headaches.  Hematological: Negative for adenopathy. Does not bruise/bleed easily.  Psychiatric/Behavioral: Negative for sleep disturbance and decreased concentration.   Past Medical History  Diagnosis Date  . AF (atrial fibrillation)   . Hyperlipidemia   . Myxomatous mitral valve     post mitral valve replacement 2006  . Other primary cardiomyopathies   . Encounter for long-term (current) use of anticoagulants   . CHF NYHA class III     History   Social History  . Marital Status: Widowed    Spouse Name: N/A    Number of Children:  N/A  . Years of Education: N/A   Occupational History  . retired   . lives in [place]    Social History Main Topics  . Smoking status: Former Smoker    Quit date: 06/01/1951  . Smokeless tobacco: Not on file  . Alcohol Use: No  . Drug Use: No  . Sexually Active: Not Currently   Other Topics Concern  . Not on file   Social History Narrative  . No narrative on file    Past Surgical History  Procedure Date  . S/p icd (guidant vitality icd model #t175), i mplanted 01/23/2007   . Hemorroidectomy   . Tonsillectomy   . Mitral valve replacement 2006 (cow valve)   . Implantable defebrillator     No family history on file.  Allergies  Allergen Reactions  . Simvastatin     REACTION: liver function elevation    Current Outpatient Prescriptions on File Prior to Visit  Medication Sig Dispense Refill  . acetaminophen (TYLENOL) 500 MG tablet 2 tabs every 6-8 hours as needed for pain  60 tablet  3  . amoxicillin (AMOXIL) 500 MG tablet Take 500 mg by mouth. 4 tabs 1 hours prior to dental work       . digoxin (LANOXIN) 0.125 MG tablet Take 125 mcg by mouth every other day.        . furosemide (LASIX) 40 MG tablet Take 40 mg by mouth daily.        Marland Kitchen levothyroxine (SYNTHROID, LEVOTHROID) 25 MCG tablet Take 25 mcg by mouth daily.        . metoprolol succinate (TOPROL-XL) 25 MG 24 hr tablet  Take 25 mg by mouth daily.        . multivitamin (THERAGRAN) per tablet Take 1 tablet by mouth daily.        . nitrofurantoin (MACRODANTIN) 50 MG capsule Take 50 mg by mouth daily.        . Nutritional Supplements (ENSURE HIGH PROTEIN PO) Take by mouth daily.        . OMEGA-3 KRILL OIL 300 MG CAPS Take by mouth 2 (two) times daily.        . sodium chloride (OCEAN) 0.65 % nasal spray 1 spray by Nasal route as needed.        . valsartan (DIOVAN) 40 MG tablet Take 40 mg by mouth daily.        Marland Kitchen warfarin (COUMADIN) 2.5 MG tablet Take 2.5 mg by mouth. 2.5 mg mon.wedn, and Friday and 5 mg all other  days       Current Facility-Administered Medications on File Prior to Visit  Medication Dose Route Frequency Provider Last Rate Last Dose  . methylPREDNISolone acetate PF (DEPO-MEDROL) injection 40 mg  40 mg Intra-articular Once Carrie Mew, MD        BP 110/70  Pulse 76  Temp 98 F (36.7 C)  Resp 16  Ht 5\' 3"  (1.6 m)  Wt 128 lb (58.06 kg)  BMI 22.67 kg/m2       Objective:   Physical Exam  Nursing note and vitals reviewed. Constitutional: She is oriented to person, place, and time. She appears well-developed and well-nourished. No distress.  HENT:  Head: Normocephalic and atraumatic.  Right Ear: External ear normal.  Left Ear: External ear normal.  Nose: Nose normal.  Mouth/Throat: Oropharynx is clear and moist.  Eyes: Conjunctivae and EOM are normal. Pupils are equal, round, and reactive to light.  Neck: Normal range of motion. Neck supple. No JVD present. No tracheal deviation present. No thyromegaly present.  Cardiovascular: Normal rate, regular rhythm, normal heart sounds and intact distal pulses.   No murmur heard. Pulmonary/Chest: Effort normal and breath sounds normal. She has no wheezes. She exhibits no tenderness.  Abdominal: Soft. Bowel sounds are normal.  Musculoskeletal: Normal range of motion. She exhibits no edema and no tenderness.  Lymphadenopathy:    She has no cervical adenopathy.  Neurological: She is alert and oriented to person, place, and time. She has normal reflexes. No cranial nerve deficit.  Skin: Skin is warm and dry. She is not diaphoretic.  Psychiatric: She has a normal mood and affect. Her behavior is normal.          Assessment & Plan:  Patient had a viral gastroenteritis with mild resultant dehydration recommend Gatorade for rehydration.  The patient's atrial fib is stable monitor Coumadin therapy with a pro time today.  Look at this above all panel for renal function.  Lipid and liver are due for monitoring today as well  as T3-T4 and TSH for thyroid.

## 2011-08-04 NOTE — Progress Notes (Signed)
Addended by: Bonnye Fava on: 08/04/2011 09:56 AM   Modules accepted: Orders

## 2011-08-04 NOTE — Progress Notes (Signed)
Addended by: Bonnye Fava on: 08/04/2011 10:07 AM   Modules accepted: Orders

## 2011-09-01 ENCOUNTER — Ambulatory Visit: Payer: Medicare PPO | Admitting: *Deleted

## 2011-09-01 ENCOUNTER — Encounter: Payer: Self-pay | Admitting: Internal Medicine

## 2011-09-01 ENCOUNTER — Ambulatory Visit (INDEPENDENT_AMBULATORY_CARE_PROVIDER_SITE_OTHER): Payer: Medicare PPO | Admitting: *Deleted

## 2011-09-01 DIAGNOSIS — I472 Ventricular tachycardia, unspecified: Secondary | ICD-10-CM

## 2011-09-01 DIAGNOSIS — Z7901 Long term (current) use of anticoagulants: Secondary | ICD-10-CM

## 2011-09-01 DIAGNOSIS — I428 Other cardiomyopathies: Secondary | ICD-10-CM

## 2011-09-01 DIAGNOSIS — I4891 Unspecified atrial fibrillation: Secondary | ICD-10-CM

## 2011-09-01 DIAGNOSIS — Z95810 Presence of automatic (implantable) cardiac defibrillator: Secondary | ICD-10-CM

## 2011-09-01 LAB — ICD DEVICE OBSERVATION
BRDY-0002RV: 40 {beats}/min
RV LEAD AMPLITUDE: 25 mv
TZAT-0001FASTVT: 1
TZAT-0001FASTVT: 2
TZAT-0001SLOWVT: 1
TZAT-0002FASTVT: NEGATIVE
TZAT-0004FASTVT: 8
TZAT-0004SLOWVT: 8
TZAT-0005FASTVT: 81 pct
TZAT-0005SLOWVT: 81 pct
TZAT-0012SLOWVT: 200 ms
TZAT-0013SLOWVT: 2
TZAT-0018FASTVT: NEGATIVE
TZAT-0018FASTVT: NEGATIVE
TZAT-0019SLOWVT: 7.5 V
TZAT-0020SLOWVT: 1 ms
TZAT-0020SLOWVT: 1 ms
TZON-0003SLOWVT: 333 ms
TZON-0004SLOWVT: 2.5
TZST-0001FASTVT: 3
TZST-0001FASTVT: 7
TZST-0001SLOWVT: 3
TZST-0001SLOWVT: 5
TZST-0001SLOWVT: 6
TZST-0003FASTVT: 31 J
TZST-0003FASTVT: 31 J
TZST-0003FASTVT: 31 J
TZST-0003SLOWVT: 14 J
TZST-0003SLOWVT: 31 J
TZST-0003SLOWVT: 31 J

## 2011-09-01 LAB — POCT INR: INR: 2.4

## 2011-09-01 NOTE — Patient Instructions (Signed)
  Latest dosing instructions   Total Sun Mon Tue Wed Thu Fri Sat   27.5 5 mg 2.5 mg 5 mg 2.5 mg 5 mg 2.5 mg 5 mg    (5 mg1) (5 mg0.5) (5 mg1) (5 mg0.5) (5 mg1) (5 mg0.5) (5 mg1)        

## 2011-09-01 NOTE — Progress Notes (Signed)
defib check in clinic  

## 2011-09-03 ENCOUNTER — Ambulatory Visit: Payer: Medicare PPO

## 2011-10-15 ENCOUNTER — Ambulatory Visit: Payer: Medicare PPO

## 2011-10-15 DIAGNOSIS — Z7901 Long term (current) use of anticoagulants: Secondary | ICD-10-CM

## 2011-10-15 DIAGNOSIS — I4891 Unspecified atrial fibrillation: Secondary | ICD-10-CM

## 2011-10-15 LAB — POCT INR: INR: 2.7

## 2011-10-15 NOTE — Patient Instructions (Signed)
  Latest dosing instructions   Total Sun Mon Tue Wed Thu Fri Sat   27.5 5 mg 2.5 mg 5 mg 2.5 mg 5 mg 2.5 mg 5 mg    (5 mg1) (5 mg0.5) (5 mg1) (5 mg0.5) (5 mg1) (5 mg0.5) (5 mg1)        

## 2011-11-15 ENCOUNTER — Ambulatory Visit (INDEPENDENT_AMBULATORY_CARE_PROVIDER_SITE_OTHER): Payer: Medicare PPO | Admitting: Family

## 2011-11-15 DIAGNOSIS — Z7901 Long term (current) use of anticoagulants: Secondary | ICD-10-CM

## 2011-11-15 DIAGNOSIS — I4891 Unspecified atrial fibrillation: Secondary | ICD-10-CM

## 2011-11-15 LAB — POCT INR: INR: 3.1

## 2011-11-15 NOTE — Patient Instructions (Addendum)
Hold todays dose. Then continue the same dose, 2.5 mg (1/2 pill) on mondays,wednesdays and fridays,5 mg (1 pill) on other days. Check in 4 weeks    Latest dosing instructions   Total Sun Mon Tue Wed Thu Fri Sat   27.5 5 mg 2.5 mg 5 mg 2.5 mg 5 mg 2.5 mg 5 mg    (5 mg1) (5 mg0.5) (5 mg1) (5 mg0.5) (5 mg1) (5 mg0.5) (5 mg1)

## 2011-11-15 NOTE — Addendum Note (Signed)
Addended by: CAMPBELL, Jessi Pitstick B on: 11/15/2011 02:31 PM   Modules accepted: Orders  

## 2011-12-13 ENCOUNTER — Telehealth: Payer: Self-pay | Admitting: Family

## 2011-12-13 ENCOUNTER — Encounter: Payer: Medicare PPO | Admitting: Family

## 2011-12-13 NOTE — Telephone Encounter (Signed)
Patient did not show up for her Coumadin appointment. Please schedule appointment asap

## 2011-12-16 ENCOUNTER — Telehealth: Payer: Self-pay | Admitting: Internal Medicine

## 2011-12-16 NOTE — Telephone Encounter (Signed)
12-16-11 lmm @ 1155am for pt to set up July pacer ck with klein or brooke/mt

## 2011-12-17 ENCOUNTER — Ambulatory Visit: Payer: Medicare PPO | Admitting: Family

## 2011-12-17 DIAGNOSIS — I4891 Unspecified atrial fibrillation: Secondary | ICD-10-CM

## 2011-12-17 DIAGNOSIS — Z7901 Long term (current) use of anticoagulants: Secondary | ICD-10-CM

## 2011-12-17 NOTE — Patient Instructions (Addendum)
Hold todays dose. Then continue the same dose Then CHANGE to 5 mg (1 pill) on mondays,wednesdays and fridays. 2.5mg  (1/2 pill) on other days. Check in 3 weeks   Latest dosing instructions   Total Sun Mon Tue Wed Thu Fri Sat   25 2.5 mg 5 mg 2.5 mg 5 mg 2.5 mg 5 mg 2.5 mg    (5 mg0.5) (5 mg1) (5 mg0.5) (5 mg1) (5 mg0.5) (5 mg1) (5 mg0.5)

## 2011-12-20 NOTE — Telephone Encounter (Signed)
Pt has coumadin appt schd for 01/07/12.

## 2012-01-07 ENCOUNTER — Ambulatory Visit (INDEPENDENT_AMBULATORY_CARE_PROVIDER_SITE_OTHER): Payer: Medicare PPO | Admitting: Family

## 2012-01-07 DIAGNOSIS — I4891 Unspecified atrial fibrillation: Secondary | ICD-10-CM

## 2012-01-07 NOTE — Patient Instructions (Addendum)
5 mg (1 pill) on mondays,wednesdays and fridays. 2.5mg (1/2 pill) on other days. Check in 4 weeks    Latest dosing instructions   Total Sun Mon Tue Wed Thu Fri Sat   25 2.5 mg 5 mg 2.5 mg 5 mg 2.5 mg 5 mg 2.5 mg    (5 mg0.5) (5 mg1) (5 mg0.5) (5 mg1) (5 mg0.5) (5 mg1) (5 mg0.5)        

## 2012-01-20 ENCOUNTER — Encounter: Payer: Self-pay | Admitting: Cardiology

## 2012-01-20 ENCOUNTER — Ambulatory Visit (INDEPENDENT_AMBULATORY_CARE_PROVIDER_SITE_OTHER): Payer: Medicare PPO | Admitting: Cardiology

## 2012-01-20 VITALS — BP 125/66 | HR 69 | Ht 62.0 in | Wt 134.4 lb

## 2012-01-20 DIAGNOSIS — Z95810 Presence of automatic (implantable) cardiac defibrillator: Secondary | ICD-10-CM

## 2012-01-20 DIAGNOSIS — I5022 Chronic systolic (congestive) heart failure: Secondary | ICD-10-CM

## 2012-01-20 LAB — ICD DEVICE OBSERVATION
BATTERY VOLTAGE: 2.59 V
DEV-0020ICD: NEGATIVE
RV LEAD IMPEDENCE ICD: 773 Ohm
TZAT-0004FASTVT: 8
TZAT-0004SLOWVT: 8
TZAT-0005SLOWVT: 81 pct
TZAT-0005SLOWVT: 81 pct
TZAT-0012FASTVT: 200 ms
TZAT-0012SLOWVT: 200 ms
TZAT-0012SLOWVT: 200 ms
TZAT-0013FASTVT: 1
TZAT-0013SLOWVT: 2
TZAT-0018FASTVT: NEGATIVE
TZAT-0018FASTVT: NEGATIVE
TZAT-0018SLOWVT: NEGATIVE
TZAT-0018SLOWVT: NEGATIVE
TZAT-0019SLOWVT: 7.5 V
TZAT-0020FASTVT: 1 ms
TZON-0004FASTVT: 2.5
TZST-0001FASTVT: 3
TZST-0001FASTVT: 5
TZST-0001SLOWVT: 4
TZST-0001SLOWVT: 7
TZST-0003FASTVT: 23 J
TZST-0003FASTVT: 31 J
TZST-0003SLOWVT: 14 J
TZST-0003SLOWVT: 31 J
VENTRICULAR PACING ICD: 0 pct

## 2012-01-20 NOTE — Patient Instructions (Signed)
Your physician recommends that you schedule a follow-up appointment in: 3 months with device clinic  

## 2012-01-20 NOTE — Progress Notes (Signed)
ELECTROPHYSIOLOGY OFFICE NOTE  Patient ID: Mary Arellano MRN: [id number], DOB/AGE: 08/14/1926   Date of Visit: 01/20/2012  Primary Physician: Darryll Capers, MD Primary Cardiologist: Tonny Bollman, MD Primary EP: Lewayne Bunting, MD Reason for Visit: Device follow-up  History of Present Illness Mary Arellano is a pleasant 78 year old woman with an ischemic CM s/p single chamber ICD implant 2008, chronic systolic CHF, AF and dyslipidemia who presents today for device follow-up. She has no complaints and states "I'm actually quite healthy for a sick person." She is doing well and denies CP, SOB, palpitations, dizziness, near syncope or syncope. She denies LE swelling, orthopnea or PND. She denies ICD shocks.  Past Medical History  Diagnosis Date  . AF (atrial fibrillation)   . Hyperlipidemia   . Myxomatous mitral valve     post mitral valve replacement 2006  . Other primary cardiomyopathies   . Encounter for long-term (current) use of anticoagulants   . CHF NYHA class III     Past Surgical History  Procedure Date  . S/p icd (guidant vitality icd model #t175), i mplanted 01/23/2007   . Hemorroidectomy   . Tonsillectomy   . Mitral valve replacement 2006 (cow valve)   . Implantable defebrillator      Allergies/Intolerances Allergies  Allergen Reactions  . Simvastatin     REACTION: liver function elevation    Current Home Medications Current Outpatient Prescriptions  Medication Sig Dispense Refill  . acetaminophen (TYLENOL) 500 MG tablet 2 tabs every 6-8 hours as needed for pain  60 tablet  3  . amoxicillin (AMOXIL) 500 MG tablet Take 500 mg by mouth. 4 tabs 1 hours prior to dental work       . digoxin (LANOXIN) 0.125 MG tablet Take 125 mcg by mouth every other day.        . furosemide (LASIX) 40 MG tablet Take 40 mg by mouth daily.        Marland Kitchen levothyroxine (SYNTHROID, LEVOTHROID) 25 MCG tablet Take 25 mcg by mouth daily.        . metoprolol succinate (TOPROL-XL) 25 MG 24  hr tablet Take 25 mg by mouth daily.        . multivitamin (THERAGRAN) per tablet Take 1 tablet by mouth daily.        . nitrofurantoin (MACRODANTIN) 50 MG capsule Take 50 mg by mouth daily.        . Nutritional Supplements (ENSURE HIGH PROTEIN PO) Take by mouth daily.        . OMEGA-3 KRILL OIL 300 MG CAPS Take by mouth 2 (two) times daily.        . sodium chloride (OCEAN) 0.65 % nasal spray 1 spray by Nasal route as needed.        . valsartan (DIOVAN) 40 MG tablet Take 40 mg by mouth daily.        Marland Kitchen warfarin (COUMADIN) 2.5 MG tablet Take 2.5 mg by mouth. 2.5 mg mon.wedn, and Friday and 5 mg all other days       Current Facility-Administered Medications  Medication Dose Route Frequency Provider Last Rate Last Dose  . methylPREDNISolone acetate PF (DEPO-MEDROL) injection 40 mg  40 mg Intra-articular Once Stacie Glaze, MD        Social History Social History  . Marital Status: Widowed   Occupational History  . retired    Social History Main Topics  . Smoking status: Former Smoker    Quit date: 06/01/1951  . Smokeless tobacco:  Not on file  . Alcohol Use: No  . Drug Use: No   Review of Systems General: No chills, fever, night sweats or weight changes Cardiovascular: No chest pain, dyspnea on exertion, edema, orthopnea, palpitations, paroxysmal nocturnal dyspnea Dermatological: No rash, lesions or masses Respiratory: No cough, dyspnea Urologic: No hematuria, dysuria Abdominal: No nausea, vomiting, diarrhea, bright red blood per rectum, melena, or hematemesis Neurologic: No visual changes, weakness, changes in mental status All other systems reviewed and are otherwise negative except as noted above.  Physical Exam Blood pressure 125/66, pulse 69, height 5\' 2"  (1.575 m), weight 134 lb 6.4 oz (60.963 kg).  General: Well developed, well appearing 78 year old female in no acute distress. HEENT: Normocephalic, atraumatic. EOMs intact. Sclera nonicteric. Oropharynx clear.  Neck:  Supple. No JVD. Lungs: Respirations regular and unlabored, CTA bilaterally. No wheezes, rales or rhonchi. Heart: RRR. S1, S2 present. No murmurs, rub, S3 or S4. Abdomen: Soft, non-distended. Extremities: No clubbing, cyanosis or edema.  Psych: Normal affect. Neuro: Alert and oriented X 3. Moves all extremities spontaneously.   Diagnostics Device interrogation shows normal ICD function with good battery status and stable lead measurements/parameters; no episodes; see PaceArt report  Assessment and Plan 1. Ischemic CM s/p ICD implant - normal device function; no episodes; no programming changes made; continue routine device follow-up in clinic in 6 months; follow-up with Dr. Ladona Ridgel in one year unless needed sooner  Mary Arellano expressed verbal understanding and agrees with this plan of care. Signed, Rick Duff, PA-C 01/20/2012, 3:45 PM

## 2012-01-21 ENCOUNTER — Encounter: Payer: Medicare PPO | Admitting: Cardiology

## 2012-02-04 ENCOUNTER — Ambulatory Visit (INDEPENDENT_AMBULATORY_CARE_PROVIDER_SITE_OTHER): Payer: Medicare PPO | Admitting: Family

## 2012-02-04 DIAGNOSIS — I4891 Unspecified atrial fibrillation: Secondary | ICD-10-CM

## 2012-02-04 LAB — POCT INR: INR: 2.3

## 2012-02-04 NOTE — Patient Instructions (Signed)
5 mg (1 pill) on mondays,wednesdays and fridays. 2.5mg  (1/2 pill) on other days. Check in 4 weeks    Latest dosing instructions   Total Sun Mon Tue Wed Thu Fri Sat   25 2.5 mg 5 mg 2.5 mg 5 mg 2.5 mg 5 mg 2.5 mg    (5 mg0.5) (5 mg1) (5 mg0.5) (5 mg1) (5 mg0.5) (5 mg1) (5 mg0.5)

## 2012-03-03 ENCOUNTER — Ambulatory Visit (INDEPENDENT_AMBULATORY_CARE_PROVIDER_SITE_OTHER): Payer: Medicare PPO | Admitting: Family

## 2012-03-03 ENCOUNTER — Encounter: Payer: Self-pay | Admitting: Cardiovascular Disease

## 2012-03-03 ENCOUNTER — Ambulatory Visit (INDEPENDENT_AMBULATORY_CARE_PROVIDER_SITE_OTHER): Payer: Medicare PPO | Admitting: Cardiovascular Disease

## 2012-03-03 VITALS — BP 120/68 | HR 68 | Resp 18 | Ht 62.0 in | Wt 133.4 lb

## 2012-03-03 DIAGNOSIS — I4891 Unspecified atrial fibrillation: Secondary | ICD-10-CM

## 2012-03-03 LAB — POCT INR: INR: 2.7

## 2012-03-03 NOTE — Patient Instructions (Addendum)
5 mg (1 pill) on mondays,wednesdays and fridays. 2.5mg (1/2 pill) on other days. Check in 6 weeks    Latest dosing instructions   Total Sun Mon Tue Wed Thu Fri Sat   25 2.5 mg 5 mg 2.5 mg 5 mg 2.5 mg 5 mg 2.5 mg    (5 mg0.5) (5 mg1) (5 mg0.5) (5 mg1) (5 mg0.5) (5 mg1) (5 mg0.5)        

## 2012-03-03 NOTE — Progress Notes (Signed)
HPI:  78 year old woman presenting for followup evaluation. The patient is followed for chronic systolic heart failure. She has a history of mitral valve disease status post mitral valve replacement in 2006. Her postoperative left ventricular ejection fraction was less than 20%. She has undergone ICD implantation. Despite her severely depressed LV function, she has done remarkably well over the last few years. She is living at [facility] [place] and she continues to enjoy it there. She denies chest pain, chest pressure, exertional dyspnea, orthopnea, PND, lightheadedness, syncope, or leg swelling. She remains moderately active.  Outpatient Encounter Prescriptions as of 03/03/2012  Medication Sig Dispense Refill  . acetaminophen (TYLENOL) 500 MG tablet 2 tabs every 6-8 hours as needed for pain  60 tablet  3  . amoxicillin (AMOXIL) 500 MG tablet Take 500 mg by mouth. 4 tabs 1 hours prior to dental work       . digoxin (LANOXIN) 0.125 MG tablet Take 125 mcg by mouth every other day.        . furosemide (LASIX) 40 MG tablet Take 40 mg by mouth daily.        Marland Kitchen levothyroxine (SYNTHROID, LEVOTHROID) 25 MCG tablet Take 25 mcg by mouth daily.        . metoprolol succinate (TOPROL-XL) 25 MG 24 hr tablet Take 25 mg by mouth daily.        . multivitamin (THERAGRAN) per tablet Take 1 tablet by mouth daily.        . nitrofurantoin (MACRODANTIN) 50 MG capsule Take 50 mg by mouth daily.        . Nutritional Supplements (ENSURE HIGH PROTEIN PO) Take by mouth daily.        . OMEGA-3 KRILL OIL 300 MG CAPS Take by mouth 2 (two) times daily.        . sodium chloride (OCEAN) 0.65 % nasal spray 1 spray by Nasal route as needed.        . valsartan (DIOVAN) 40 MG tablet Take 40 mg by mouth daily.        Marland Kitchen warfarin (COUMADIN) 2.5 MG tablet Take 2.5 mg by mouth. 2.5 mg mon.wedn, and Friday and 5 mg all other days       Facility-Administered Encounter Medications as of 03/03/2012  Medication Dose Route Frequency Provider Last  Rate Last Dose  . methylPREDNISolone acetate PF (DEPO-MEDROL) injection 40 mg  40 mg Intra-articular Once Stacie Glaze, MD        Allergies  Allergen Reactions  . Simvastatin     REACTION: liver function elevation    Past Medical History  Diagnosis Date  . AF (atrial fibrillation)   . Hyperlipidemia   . Myxomatous mitral valve     post mitral valve replacement 2006  . Other primary cardiomyopathies   . Encounter for long-term (current) use of anticoagulants   . CHF NYHA class III     ROS: Negative except as per HPI  BP 120/68  Pulse 68  Resp 18  Ht 5\' 2"  (1.575 m)  Wt 60.51 kg (133 lb 6.4 oz)  BMI 24.40 kg/m2  SpO2 97%  PHYSICAL EXAM: Pt is alert and oriented, NAD HEENT: normal Neck: JVP - normal, carotids 2+= without bruits Lungs: CTA bilaterally CV: RRR without murmur or gallop Abd: soft, NT, Positive BS, no hepatomegaly Ext: no C/C/E, distal pulses intact and equal Skin: warm/dry no rash  EKG:  Normal sinus rhythm 77 beats per minute, nonspecific ST and T wave abnormality.  ASSESSMENT AND  PLAN: 1. Chronic systolic heart failure secondary to severe nonischemic cardiomyopathy (valvular). The patient remains asymptomatic on her current medical program and she will continue the same. She has upcoming lab work at [facility] office and we will add on a metabolic panel and digoxin level. I would like to see her back in followup in 6 months.  2. Mitral regurgitation status post mitral valve repair. Her exam remains within normal limits.  3. Status post ICD. She is followed in the electrophysiology clinic. She has not had any device discharges.  Tonny Bollman

## 2012-03-03 NOTE — Patient Instructions (Addendum)
Your physician wants you to follow-up in: 6 MONTHS with Dr Excell Seltzer.  You will receive a reminder letter in the mail two months in advance. If you don't receive a letter, please call our office to schedule the follow-up appointment.  Your physician recommends that you return for lab work: BMP and Digoxin level on 04/14/12 at [facility]  Your physician recommends that you continue on your current medications as directed. Please refer to the Current Medication list given to you today.

## 2012-03-08 ENCOUNTER — Telehealth: Payer: Self-pay | Admitting: Internal Medicine

## 2012-03-08 NOTE — Telephone Encounter (Signed)
Found and done

## 2012-03-08 NOTE — Telephone Encounter (Signed)
[facility] called and needs signed routine physicians orders asap. The orders were faxed to Dr Lovell Sheehan last night. Need sig asap. Can be sign by NP or another physician if pcp not avail. There are no changes, just verification for pharmacy.

## 2012-04-11 ENCOUNTER — Encounter: Payer: Self-pay | Admitting: *Deleted

## 2012-04-14 ENCOUNTER — Ambulatory Visit (INDEPENDENT_AMBULATORY_CARE_PROVIDER_SITE_OTHER): Payer: Medicare PPO | Admitting: Family

## 2012-04-14 DIAGNOSIS — I4891 Unspecified atrial fibrillation: Secondary | ICD-10-CM

## 2012-04-14 LAB — POCT INR: INR: 2

## 2012-04-14 LAB — BASIC METABOLIC PANEL
GFR: 47.37 mL/min — ABNORMAL LOW (ref >60.00)
Potassium: 3.8 mEq/L (ref 3.5–5.1)
Sodium: 138 mEq/L (ref 135–145)

## 2012-04-14 NOTE — Patient Instructions (Addendum)
5 mg (1 pill) on mondays,wednesdays and fridays. 2.5mg (1/2 pill) on other days. Check in 6 weeks    Latest dosing instructions   Total Sun Mon Tue Wed Thu Fri Sat   25 2.5 mg 5 mg 2.5 mg 5 mg 2.5 mg 5 mg 2.5 mg    (5 mg0.5) (5 mg1) (5 mg0.5) (5 mg1) (5 mg0.5) (5 mg1) (5 mg0.5)        

## 2012-04-15 LAB — DIGOXIN LEVEL: Digoxin Level: 0.5 ng/mL — ABNORMAL LOW (ref 0.8–2.0)

## 2012-04-17 ENCOUNTER — Ambulatory Visit (INDEPENDENT_AMBULATORY_CARE_PROVIDER_SITE_OTHER): Payer: Medicare PPO | Admitting: *Deleted

## 2012-04-17 ENCOUNTER — Encounter: Payer: Self-pay | Admitting: Internal Medicine

## 2012-04-17 DIAGNOSIS — I5022 Chronic systolic (congestive) heart failure: Secondary | ICD-10-CM

## 2012-04-17 DIAGNOSIS — I428 Other cardiomyopathies: Secondary | ICD-10-CM

## 2012-04-17 DIAGNOSIS — I4891 Unspecified atrial fibrillation: Secondary | ICD-10-CM

## 2012-04-17 LAB — ICD DEVICE OBSERVATION
BRDY-0002RV: 40 {beats}/min
DEVICE MODEL ICD: 131373
RV LEAD AMPLITUDE: 23.6 mv
TZAT-0001FASTVT: 1
TZAT-0001FASTVT: 2
TZAT-0001SLOWVT: 1
TZAT-0001SLOWVT: 2
TZAT-0002FASTVT: NEGATIVE
TZAT-0004FASTVT: 8
TZAT-0018FASTVT: NEGATIVE
TZAT-0018FASTVT: NEGATIVE
TZAT-0018SLOWVT: NEGATIVE
TZAT-0019SLOWVT: 7.5 V
TZAT-0019SLOWVT: 7.5 V
TZAT-0020FASTVT: 1 ms
TZAT-0020SLOWVT: 1 ms
TZAT-0020SLOWVT: 1 ms
TZON-0003SLOWVT: 333 ms
TZON-0004FASTVT: 2.5
TZON-0004SLOWVT: 2.5
TZON-0005FASTVT: 1
TZON-0005SLOWVT: 1
TZST-0001FASTVT: 3
TZST-0001FASTVT: 6
TZST-0001SLOWVT: 3
TZST-0001SLOWVT: 5
TZST-0001SLOWVT: 7
TZST-0003FASTVT: 31 J
TZST-0003FASTVT: 31 J
TZST-0003FASTVT: 31 J
TZST-0003SLOWVT: 14 J
TZST-0003SLOWVT: 31 J
TZST-0003SLOWVT: 31 J
VENTRICULAR PACING ICD: 0 pct

## 2012-04-17 NOTE — Progress Notes (Signed)
ICD check 

## 2012-04-17 NOTE — Patient Instructions (Addendum)
Return office visit 07/18/12 @ 10:45am with Dr. Ladona Ridgel.

## 2012-05-26 ENCOUNTER — Ambulatory Visit (INDEPENDENT_AMBULATORY_CARE_PROVIDER_SITE_OTHER): Payer: Medicare PPO | Admitting: Family

## 2012-05-26 DIAGNOSIS — I4891 Unspecified atrial fibrillation: Secondary | ICD-10-CM

## 2012-05-26 NOTE — Patient Instructions (Addendum)
5 mg (1 pill) on mondays,wednesdays and fridays. 2.5mg  (1/2 pill) on other days. Check in 6 weeks    Latest dosing instructions   Total Sun Mon Tue Wed Thu Fri Sat   25 2.5 mg 5 mg 2.5 mg 5 mg 2.5 mg 5 mg 2.5 mg    (5 mg0.5) (5 mg1) (5 mg0.5) (5 mg1) (5 mg0.5) (5 mg1) (5 mg0.5)

## 2012-07-07 ENCOUNTER — Ambulatory Visit (INDEPENDENT_AMBULATORY_CARE_PROVIDER_SITE_OTHER): Payer: Medicare PPO | Admitting: Family

## 2012-07-07 DIAGNOSIS — I4891 Unspecified atrial fibrillation: Secondary | ICD-10-CM

## 2012-07-07 NOTE — Patient Instructions (Signed)
5 mg (1 pill) on mondays,wednesdays and fridays. 2.5mg (1/2 pill) on other days. Check in 6 weeks    Latest dosing instructions   Total Sun Mon Tue Wed Thu Fri Sat   25 2.5 mg 5 mg 2.5 mg 5 mg 2.5 mg 5 mg 2.5 mg    (5 mg0.5) (5 mg1) (5 mg0.5) (5 mg1) (5 mg0.5) (5 mg1) (5 mg0.5)        

## 2012-07-18 ENCOUNTER — Ambulatory Visit (INDEPENDENT_AMBULATORY_CARE_PROVIDER_SITE_OTHER): Payer: Medicare PPO | Admitting: Internal Medicine

## 2012-07-18 ENCOUNTER — Encounter: Payer: Self-pay | Admitting: Internal Medicine

## 2012-07-18 VITALS — BP 138/66 | HR 61 | Ht 62.0 in | Wt 135.6 lb

## 2012-07-18 DIAGNOSIS — I5022 Chronic systolic (congestive) heart failure: Secondary | ICD-10-CM

## 2012-07-18 DIAGNOSIS — I4891 Unspecified atrial fibrillation: Secondary | ICD-10-CM

## 2012-07-18 DIAGNOSIS — Z95810 Presence of automatic (implantable) cardiac defibrillator: Secondary | ICD-10-CM

## 2012-07-18 DIAGNOSIS — I428 Other cardiomyopathies: Secondary | ICD-10-CM

## 2012-07-18 LAB — ICD DEVICE OBSERVATION
BRDY-0002RV: 40 {beats}/min
DEVICE MODEL ICD: 131373
RV LEAD IMPEDENCE ICD: 738 Ohm
TZAT-0004FASTVT: 8
TZAT-0004SLOWVT: 8
TZAT-0004SLOWVT: 8
TZAT-0005FASTVT: 81 pct
TZAT-0005SLOWVT: 81 pct
TZAT-0005SLOWVT: 81 pct
TZAT-0012SLOWVT: 200 ms
TZAT-0012SLOWVT: 200 ms
TZAT-0013FASTVT: 1
TZAT-0013SLOWVT: 2
TZAT-0018FASTVT: NEGATIVE
TZAT-0019SLOWVT: 7.5 V
TZON-0003SLOWVT: 333 ms
TZST-0001FASTVT: 3
TZST-0001FASTVT: 5
TZST-0001FASTVT: 6
TZST-0001SLOWVT: 4
TZST-0001SLOWVT: 6
TZST-0003FASTVT: 31 J
TZST-0003FASTVT: 31 J
TZST-0003SLOWVT: 14 J
TZST-0003SLOWVT: 23 J
TZST-0003SLOWVT: 31 J

## 2012-07-18 NOTE — Progress Notes (Signed)
HPI Mary Arellano returns today for followup. She is an 79 year old woman with a history of mitral valve replacement, nonischemic cardiomyopathy, chronic systolic heart failure, status post prophylactic ICD implantation. In the interim, she has done well. She is asked to gain some weight over the last several months. Her appetite is good. She is trying to maintain a low-sodium diet. She exercises almost every day. She denies chest pain or shortness of breath. No peripheral edema. Allergies  Allergen Reactions  . Simvastatin     REACTION: liver function elevation     Current Outpatient Prescriptions  Medication Sig Dispense Refill  . acetaminophen (TYLENOL) 500 MG tablet 2 tabs every 6-8 hours as needed for pain  60 tablet  3  . amoxicillin (AMOXIL) 500 MG tablet Take 500 mg by mouth. 4 tabs 1 hours prior to dental work       . digoxin (LANOXIN) 0.125 MG tablet Take 125 mcg by mouth every other day.        . furosemide (LASIX) 40 MG tablet Take 40 mg by mouth daily.        Marland Kitchen levothyroxine (SYNTHROID, LEVOTHROID) 25 MCG tablet Take 25 mcg by mouth daily.        . metoprolol succinate (TOPROL-XL) 25 MG 24 hr tablet Take 25 mg by mouth daily.        . multivitamin (THERAGRAN) per tablet Take 1 tablet by mouth daily.        . nitrofurantoin (MACRODANTIN) 50 MG capsule Take 50 mg by mouth daily.        . Nutritional Supplements (ENSURE HIGH PROTEIN PO) Take by mouth daily.        . OMEGA-3 KRILL OIL 300 MG CAPS Take by mouth 2 (two) times daily.        . sodium chloride (OCEAN) 0.65 % nasal spray 1 spray by Nasal route as needed.        . valsartan (DIOVAN) 40 MG tablet Take 40 mg by mouth daily.        Marland Kitchen warfarin (COUMADIN) 2.5 MG tablet Take 2.5 mg by mouth. 2.5 mg mon.wedn, and Friday and 5 mg all other days       Current Facility-Administered Medications  Medication Dose Route Frequency Provider Last Rate Last Dose  . methylPREDNISolone acetate PF (DEPO-MEDROL) injection 40 mg  40 mg  Intra-articular Once Stacie Glaze, MD         Past Medical History  Diagnosis Date  . AF (atrial fibrillation)   . Hyperlipidemia   . Myxomatous mitral valve     post mitral valve replacement 2006  . Other primary cardiomyopathies   . Long term (current) use of anticoagulants   . CHF NYHA class III     ROS:   All systems reviewed and negative except as noted in the HPI.   Past Surgical History  Procedure Laterality Date  . S/p icd (guidant vitality icd model #t175), i mplanted 01/23/2007    . Hemorroidectomy    . Tonsillectomy    . Mitral valve replacement 2006 (cow valve)    . Implantable defebrillator       History reviewed. No pertinent family history.   History   Social History  . Marital Status: Widowed    Spouse Name: N/A    Number of Children: N/A  . Years of Education: N/A   Occupational History  . retired   . lives in [place]    Social History Main Topics  .  Smoking status: Former Smoker    Quit date: 06/01/1951  . Smokeless tobacco: Not on file  . Alcohol Use: No  . Drug Use: No  . Sexually Active: Not Currently   Other Topics Concern  . Not on file   Social History Narrative  . No narrative on file     BP 138/66  Pulse 61  Ht 5\' 2"  (1.575 m)  Wt 135 lb 9.6 oz (61.508 kg)  BMI 24.8 kg/m2  SpO2 99%  Physical Exam:  Well appearing 79 year old woman,NAD HEENT: Unremarkable Neck - 7 cm jugular venous distention, no thyromegaly, trachea midline Back:  No CVA tenderness Lungs:  Clearwith no wheezes, rales, or rhonchi. HEART:  Regular rate rhythm, no murmurs, no rubs, no clicks Abd:  soft, positive bowel sounds, no organomegally, no rebound, no guarding Ext:  2 plus pulses, no edema, no cyanosis, no clubbing Skin:  No rashes no nodules Neuro:  CN II through XII intact, motor grossly intact  DEVICE  Normal device function.  See PaceArt for details.   Assess/Plan:

## 2012-07-18 NOTE — Assessment & Plan Note (Signed)
Her atrial fibrillation has been well controlled. Will follow.

## 2012-07-18 NOTE — Assessment & Plan Note (Signed)
Her chronic systolic heart failure is well compensated. She will continue her current medical therapy. Her heart failure appears to have improved to class II. I've encouraged her to continue her current medical therapy and maintain a low-sodium diet.

## 2012-07-18 NOTE — Patient Instructions (Signed)
Your physician recommends that you schedule a follow-up appointment on Oct 16, 2012 at 10AM with device clinic.

## 2012-07-18 NOTE — Assessment & Plan Note (Signed)
Her [employer] ICD is working normally. We'll plan to recheck in several months. No recent ICD therapies. We discussed whether or not to replace the device when he reaches elective replacement. She is reflecting.

## 2012-07-19 ENCOUNTER — Encounter: Payer: Self-pay | Admitting: Internal Medicine

## 2012-08-11 ENCOUNTER — Ambulatory Visit (INDEPENDENT_AMBULATORY_CARE_PROVIDER_SITE_OTHER): Payer: Medicare PPO | Admitting: Family

## 2012-08-11 DIAGNOSIS — I4891 Unspecified atrial fibrillation: Secondary | ICD-10-CM

## 2012-08-11 LAB — POCT INR: INR: 2.6

## 2012-08-11 NOTE — Patient Instructions (Addendum)
5 mg (1 pill) on mondays,wednesdays and fridays. 2.5mg  (1/2 pill) on other days. Check in 6 weeks  Anticoagulation Dose Instructions as of 08/11/2012     [name] Tue Wed Thu Fri Sat   New Dose 2.5 mg 5 mg 2.5 mg 5 mg 2.5 mg 5 mg 2.5 mg    Description       5 mg (1 pill) on mondays,wednesdays and fridays. 2.5mg  (1/2 pill) on other days. Check in 6 weeks

## 2012-09-05 ENCOUNTER — Ambulatory Visit (INDEPENDENT_AMBULATORY_CARE_PROVIDER_SITE_OTHER): Payer: Medicare PPO | Admitting: Cardiovascular Disease

## 2012-09-05 ENCOUNTER — Encounter: Payer: Self-pay | Admitting: Cardiovascular Disease

## 2012-09-05 VITALS — BP 98/60 | HR 79 | Ht 63.0 in | Wt 136.0 lb

## 2012-09-05 DIAGNOSIS — I4891 Unspecified atrial fibrillation: Secondary | ICD-10-CM

## 2012-09-05 DIAGNOSIS — I5032 Chronic diastolic (congestive) heart failure: Secondary | ICD-10-CM

## 2012-09-05 NOTE — Patient Instructions (Addendum)
Your physician wants you to follow-up in: 6 MONTHS with Dr Excell Seltzer.  You will receive a reminder letter in the mail two months in advance. If you don't receive a letter, please call our office to schedule the follow-up appointment.  Your physician recommends that you continue on your current medications as directed. Please refer to the Current Medication list given to you today.  Your physician recommends that you return for lab work in: 6 MONTHS (CBC, BMP and LIVER)

## 2012-09-05 NOTE — Progress Notes (Signed)
HPI:  79 year old woman presenting for followup evaluation. The patient has a history of nonischemic cardiomyopathy, mitral regurgitation status post mitral valve replacement with a bioprosthetic valve, and ICD implantation for primary prevention.  She continues to do well. She's going out to play bingo after she leaves the office today. She's had no problems with exertional dyspnea, chest pain, lightheadedness, palpitations, or syncope. She's tolerating her medical therapy well and is compliant with taking her medications. She ambulates with a walker.  Outpatient Encounter Prescriptions as of 09/05/2012  Medication Sig Dispense Refill  . acetaminophen (TYLENOL) 500 MG tablet 2 tabs every 6-8 hours as needed for pain  60 tablet  3  . amoxicillin (AMOXIL) 500 MG tablet Take 500 mg by mouth. 4 tabs 1 hours prior to dental work       . digoxin (LANOXIN) 0.125 MG tablet Take 125 mcg by mouth every other day.        . furosemide (LASIX) 40 MG tablet Take 40 mg by mouth daily.        Marland Kitchen levothyroxine (SYNTHROID, LEVOTHROID) 25 MCG tablet Take 25 mcg by mouth daily.        . metoprolol succinate (TOPROL-XL) 25 MG 24 hr tablet Take 25 mg by mouth daily.        . multivitamin (THERAGRAN) per tablet Take 1 tablet by mouth daily.        . nitrofurantoin (MACRODANTIN) 50 MG capsule Take 50 mg by mouth daily.        . Nutritional Supplements (ENSURE HIGH PROTEIN PO) Take by mouth daily.        . OMEGA-3 KRILL OIL 300 MG CAPS Take by mouth 2 (two) times daily.        . sodium chloride (OCEAN) 0.65 % nasal spray 1 spray by Nasal route as needed.        . valsartan (DIOVAN) 40 MG tablet Take 40 mg by mouth daily.        Marland Kitchen warfarin (COUMADIN) 2.5 MG tablet Take 2.5 mg by mouth. 2.5 mg mon.wedn, and Friday and 5 mg all other days      . [DISCONTINUED] methylPREDNISolone acetate PF (DEPO-MEDROL) injection 40 mg        No facility-administered encounter medications on file as of 09/05/2012.    Allergies    Allergen Reactions  . Simvastatin     REACTION: liver function elevation    Past Medical History  Diagnosis Date  . AF (atrial fibrillation)   . Hyperlipidemia   . Myxomatous mitral valve     post mitral valve replacement 2006  . Other primary cardiomyopathies   . Long term (current) use of anticoagulants   . CHF NYHA class III     ROS: Negative except as per HPI  BP 98/60  Pulse 79  Ht 5\' 3"  (1.6 m)  Wt 61.689 kg (136 lb)  BMI 24.1 kg/m2  SpO2 98%  PHYSICAL EXAM: Pt is alert and oriented,  Pleasant elderly woman inNAD HEENT: normal Neck: JVP - normal, carotids 2+= without bruits Lungs: CTA bilaterally CV: Irregularly irregular without murmur or gallop Abd: soft, NT, Positive BS, no hepatomegaly Ext: no C/C/E, distal pulses intact and equal Skin: warm/dry no rash  EKG:  Atrial fibrillation 79 beats per minute, PVCs versus aberrantly conducted complexes, ST and T wave abnormality consider lateral ischemia or dig effect.  ASSESSMENT AND PLAN: 1. Chronic systolic heart failure. The patient is doing well from a functional perspective. She has no  symptoms of heart failure. Her medical therapy was reviewed and she is on a long-acting metoprolol succinate and an angiotensin receptor blocker. She will continue her same program.  2. Atrial fibrillation. Heart rate is controlled. She is anticoagulated with warfarin.  3. Mitral valve disorder status post mitral valve replacement. Exam remains within normal limits with no evidence of mitral insufficiency.  For followup I will see her back in 6 months with labs before office visit.  Tonny Bollman 09/05/2012 9:07 AM

## 2012-09-21 ENCOUNTER — Ambulatory Visit (INDEPENDENT_AMBULATORY_CARE_PROVIDER_SITE_OTHER): Payer: Medicare PPO | Admitting: Family

## 2012-09-21 DIAGNOSIS — I4891 Unspecified atrial fibrillation: Secondary | ICD-10-CM

## 2012-09-21 LAB — POCT INR: INR: 2.3

## 2012-09-21 NOTE — Patient Instructions (Signed)
5 mg (1 pill) on mondays,wednesdays and fridays. 2.5mg  (1/2 pill) on other days. Check in 6 weeks  Anticoagulation Dose Instructions as of 09/21/2012     [name] Tue Wed Thu Fri Sat   New Dose 2.5 mg 5 mg 2.5 mg 5 mg 2.5 mg 5 mg 2.5 mg    Description        5 mg (1 pill) on mondays,wednesdays and fridays. 2.5mg  (1/2 pill) on other days. Check in 6 weeks

## 2012-09-22 ENCOUNTER — Encounter: Payer: Medicare PPO | Admitting: Family

## 2012-10-17 ENCOUNTER — Encounter: Payer: Self-pay | Admitting: Cardiology

## 2012-10-17 ENCOUNTER — Encounter: Payer: Self-pay | Admitting: Internal Medicine

## 2012-10-17 ENCOUNTER — Ambulatory Visit (INDEPENDENT_AMBULATORY_CARE_PROVIDER_SITE_OTHER): Payer: Medicare PPO | Admitting: Cardiology

## 2012-10-17 VITALS — Ht 62.5 in | Wt 138.6 lb

## 2012-10-17 DIAGNOSIS — I428 Other cardiomyopathies: Secondary | ICD-10-CM

## 2012-10-17 DIAGNOSIS — Z95810 Presence of automatic (implantable) cardiac defibrillator: Secondary | ICD-10-CM

## 2012-10-17 DIAGNOSIS — I4891 Unspecified atrial fibrillation: Secondary | ICD-10-CM

## 2012-10-17 LAB — ICD DEVICE OBSERVATION
FVT: 0
HV IMPEDENCE: 44 Ohm
PACEART VT: 0
RV LEAD THRESHOLD: 0.8 V
TZAT-0001FASTVT: 2
TZAT-0001SLOWVT: 1
TZAT-0004SLOWVT: 8
TZAT-0005SLOWVT: 81 pct
TZAT-0005SLOWVT: 81 pct
TZAT-0012FASTVT: 200 ms
TZAT-0012SLOWVT: 200 ms
TZAT-0013FASTVT: 1
TZAT-0013SLOWVT: 2
TZAT-0018FASTVT: NEGATIVE
TZAT-0018SLOWVT: NEGATIVE
TZAT-0018SLOWVT: NEGATIVE
TZAT-0019FASTVT: 7.5 V
TZAT-0019SLOWVT: 7.5 V
TZAT-0019SLOWVT: 7.5 V
TZAT-0020FASTVT: 1 ms
TZAT-0020SLOWVT: 1 ms
TZON-0003FASTVT: 300 ms
TZON-0003SLOWVT: 333 ms
TZON-0004FASTVT: 2.5
TZON-0005FASTVT: 1
TZON-0005SLOWVT: 1
TZST-0001FASTVT: 5
TZST-0001FASTVT: 6
TZST-0001SLOWVT: 4
TZST-0001SLOWVT: 5
TZST-0001SLOWVT: 6
TZST-0003FASTVT: 23 J
TZST-0003FASTVT: 31 J
TZST-0003FASTVT: 31 J
TZST-0003FASTVT: 31 J
TZST-0003SLOWVT: 23 J
TZST-0003SLOWVT: 31 J
VF: 0

## 2012-10-17 NOTE — Patient Instructions (Signed)
Your physician recommends that you follow-up with Gunnar Fusi and Belenda Cruise in the Device Clinic on 01/17/2013 @ 9:00 am.  Your physician recommends that you continue on your current medications as directed. Please refer to the Current Medication list given to you today.

## 2012-10-17 NOTE — Progress Notes (Signed)
Routine ICD check/device clinic visit. Seen by Dr. Ladona Ridgel for routine EP follow-up in Feb 2014. Ms. Standen has no complaints and reports she feels well.  Normal ICD function. No programming changes made. See PaceArt report.

## 2012-11-02 ENCOUNTER — Encounter: Payer: Medicare PPO | Admitting: Family

## 2012-11-03 ENCOUNTER — Ambulatory Visit (INDEPENDENT_AMBULATORY_CARE_PROVIDER_SITE_OTHER): Payer: Medicare PPO | Admitting: Family

## 2012-11-03 DIAGNOSIS — I4891 Unspecified atrial fibrillation: Secondary | ICD-10-CM

## 2012-11-03 LAB — POCT INR: INR: 2.1

## 2012-11-03 NOTE — Patient Instructions (Signed)
5 mg (1 pill) on mondays,wednesdays and fridays. 2.5mg  (1/2 pill) on other days. Check in 6 weeks  Anticoagulation Dose Instructions as of 11/03/2012     [name] Tue Wed Thu Fri Sat   New Dose 2.5 mg 5 mg 2.5 mg 5 mg 2.5 mg 5 mg 2.5 mg    Description        5 mg (1 pill) on mondays,wednesdays and fridays. 2.5mg  (1/2 pill) on other days. Check in 6 weeks

## 2012-12-18 ENCOUNTER — Ambulatory Visit: Payer: Medicare PPO

## 2012-12-18 ENCOUNTER — Encounter: Payer: Medicare PPO | Admitting: Family

## 2012-12-18 ENCOUNTER — Ambulatory Visit (INDEPENDENT_AMBULATORY_CARE_PROVIDER_SITE_OTHER): Payer: Medicare PPO | Admitting: General Practice

## 2012-12-18 DIAGNOSIS — I4891 Unspecified atrial fibrillation: Secondary | ICD-10-CM

## 2012-12-18 LAB — POCT INR: INR: 2.5

## 2013-01-17 ENCOUNTER — Ambulatory Visit (INDEPENDENT_AMBULATORY_CARE_PROVIDER_SITE_OTHER): Payer: Medicare PPO | Admitting: *Deleted

## 2013-01-17 DIAGNOSIS — I428 Other cardiomyopathies: Secondary | ICD-10-CM

## 2013-01-17 LAB — ICD DEVICE OBSERVATION
BATTERY VOLTAGE: 2.58 V
HV IMPEDENCE: 44 Ohm
RV LEAD AMPLITUDE: 25 mv
RV LEAD IMPEDENCE ICD: 761 Ohm
RV LEAD THRESHOLD: 0.8 V
TZAT-0001SLOWVT: 2
TZAT-0004SLOWVT: 8
TZAT-0005FASTVT: 81 pct
TZAT-0012FASTVT: 200 ms
TZAT-0012SLOWVT: 200 ms
TZAT-0013SLOWVT: 2
TZAT-0013SLOWVT: 2
TZAT-0018FASTVT: NEGATIVE
TZAT-0018SLOWVT: NEGATIVE
TZAT-0018SLOWVT: NEGATIVE
TZAT-0019FASTVT: 7.5 V
TZAT-0019SLOWVT: 7.5 V
TZAT-0020FASTVT: 1 ms
TZAT-0020SLOWVT: 1 ms
TZON-0003FASTVT: 300 ms
TZON-0003SLOWVT: 333 ms
TZON-0004FASTVT: 2.5
TZON-0004SLOWVT: 2.5
TZON-0005SLOWVT: 1
TZST-0001FASTVT: 4
TZST-0001FASTVT: 6
TZST-0001SLOWVT: 3
TZST-0001SLOWVT: 4
TZST-0001SLOWVT: 6
TZST-0001SLOWVT: 7
TZST-0003FASTVT: 23 J
TZST-0003FASTVT: 31 J
TZST-0003FASTVT: 31 J
TZST-0003SLOWVT: 23 J

## 2013-01-17 NOTE — Progress Notes (Signed)
ICD check in office. 

## 2013-02-01 ENCOUNTER — Ambulatory Visit (INDEPENDENT_AMBULATORY_CARE_PROVIDER_SITE_OTHER): Payer: Medicare PPO | Admitting: General Practice

## 2013-02-01 DIAGNOSIS — I4891 Unspecified atrial fibrillation: Secondary | ICD-10-CM

## 2013-02-01 LAB — POCT INR: INR: 2.7

## 2013-02-28 ENCOUNTER — Encounter: Payer: Self-pay | Admitting: Internal Medicine

## 2013-03-15 ENCOUNTER — Ambulatory Visit: Payer: Medicare PPO

## 2013-03-15 ENCOUNTER — Ambulatory Visit (INDEPENDENT_AMBULATORY_CARE_PROVIDER_SITE_OTHER): Payer: Medicare PPO | Admitting: General Practice

## 2013-03-15 DIAGNOSIS — I4891 Unspecified atrial fibrillation: Secondary | ICD-10-CM

## 2013-03-15 DIAGNOSIS — Z7901 Long term (current) use of anticoagulants: Secondary | ICD-10-CM

## 2013-04-19 ENCOUNTER — Ambulatory Visit (INDEPENDENT_AMBULATORY_CARE_PROVIDER_SITE_OTHER): Payer: Medicare PPO | Admitting: *Deleted

## 2013-04-19 ENCOUNTER — Ambulatory Visit (INDEPENDENT_AMBULATORY_CARE_PROVIDER_SITE_OTHER): Payer: Medicare PPO | Admitting: General Practice

## 2013-04-19 DIAGNOSIS — I428 Other cardiomyopathies: Secondary | ICD-10-CM

## 2013-04-19 DIAGNOSIS — I4891 Unspecified atrial fibrillation: Secondary | ICD-10-CM

## 2013-04-19 DIAGNOSIS — Z7901 Long term (current) use of anticoagulants: Secondary | ICD-10-CM

## 2013-04-19 DIAGNOSIS — I5022 Chronic systolic (congestive) heart failure: Secondary | ICD-10-CM

## 2013-04-19 LAB — MDC_IDC_ENUM_SESS_TYPE_INCLINIC
Battery Voltage: 2.57 V
Brady Statistic RV Percent Paced: 0 %
Date Time Interrogation Session: 20141120050000
HighPow Impedance: 42 Ohm
Implantable Pulse Generator Serial Number: 131373
Lead Channel Impedance Value: 749 Ohm
Zone Setting Detection Interval: 300 ms
Zone Setting Detection Interval: 333 ms

## 2013-04-19 LAB — POCT INR: INR: 2.7

## 2013-04-19 NOTE — Progress Notes (Signed)
Pre-visit discussion using our clinic review tool. No additional management support is needed unless otherwise documented below in the visit note.  

## 2013-04-19 NOTE — Progress Notes (Signed)
ICD check in clinic. Normal device function. Thresholds and sensing consistent with previous device measurements. Impedance trends stable over time. No evidence of any ventricular arrhythmias. Histogram distribution appropriate for patient and level of activity. No changes made this session. Device programmed at appropriate safety margins. Device programmed to optimize intrinsic conduction. Estimated longevity MOL2.  Patient education completed including shock plan. Alert tones/vibration demonstrated for patient.  ROV in February with Dr. Ladona Ridgel.

## 2013-05-11 ENCOUNTER — Encounter: Payer: Self-pay | Admitting: Cardiovascular Disease

## 2013-05-11 ENCOUNTER — Ambulatory Visit (INDEPENDENT_AMBULATORY_CARE_PROVIDER_SITE_OTHER): Payer: Medicare PPO | Admitting: Cardiovascular Disease

## 2013-05-11 ENCOUNTER — Other Ambulatory Visit: Payer: Medicare PPO

## 2013-05-11 VITALS — BP 147/80 | HR 73 | Ht 62.5 in | Wt 137.8 lb

## 2013-05-11 DIAGNOSIS — I059 Rheumatic mitral valve disease, unspecified: Secondary | ICD-10-CM

## 2013-05-11 DIAGNOSIS — I4891 Unspecified atrial fibrillation: Secondary | ICD-10-CM

## 2013-05-11 DIAGNOSIS — E785 Hyperlipidemia, unspecified: Secondary | ICD-10-CM

## 2013-05-11 NOTE — Progress Notes (Signed)
HPI:   79 year old woman presenting for followup evaluation. The patient has a history of nonischemic cardiomyopathy, mitral regurgitation status post mitral valve replacement with a bioprosthetic valve, and ICD implantation for primary prevention.  The patient is doing well. She denies chest pain, chest pressure, shortness of breath, edema, or palpitations. She continues to enjoy living at [facility]. She tries to stay as active as possible. She walks with the assistance of a walker.   Outpatient Encounter Prescriptions as of 05/11/2013  Medication Sig  . acetaminophen (TYLENOL) 500 MG tablet 2 tabs every 6-8 hours as needed for pain  . amoxicillin (AMOXIL) 500 MG tablet Take 500 mg by mouth. 4 tabs 1 hours prior to dental work   . digoxin (LANOXIN) 0.125 MG tablet Take 125 mcg by mouth every other day.    . furosemide (LASIX) 40 MG tablet Take 40 mg by mouth daily.   Marland Kitchen levothyroxine (SYNTHROID, LEVOTHROID) 25 MCG tablet Take 25 mcg by mouth daily.    . metoprolol succinate (TOPROL-XL) 25 MG 24 hr tablet Take 25 mg by mouth daily.    . multivitamin (THERAGRAN) per tablet Take 1 tablet by mouth daily.    . Nutritional Supplements (ENSURE HIGH PROTEIN PO) Take by mouth daily.    . OMEGA-3 KRILL OIL 300 MG CAPS Take by mouth 2 (two) times daily.    . sodium chloride (OCEAN) 0.65 % nasal spray 1 spray by Nasal route as needed.    . valsartan (DIOVAN) 40 MG tablet Take 40 mg by mouth daily.    Marland Kitchen warfarin (COUMADIN) 2.5 MG tablet Take 2.5 mg by mouth. 2.5 mg mon.wedn, and Friday and 5 mg all other days  . [DISCONTINUED] nitrofurantoin (MACRODANTIN) 50 MG capsule Take 50 mg by mouth daily.      Allergies  Allergen Reactions  . Simvastatin     REACTION: liver function elevation    Past Medical History  Diagnosis Date  . AF (atrial fibrillation)   . Hyperlipidemia   . Myxomatous mitral valve     post mitral valve replacement 2006  . Other primary cardiomyopathies   . Long term  (current) use of anticoagulants   . CHF NYHA class III     ROS: Negative except as per HPI  BP 147/80  Pulse 73  Ht 5' 2.5" (1.588 m)  Wt 137 lb 12.8 oz (62.506 kg)  BMI 24.79 kg/m2  PHYSICAL EXAM: Pt is alert and oriented, delightful elderly woman in NAD HEENT: normal Neck: JVP - normal, carotids 2+= without bruits Lungs: CTA bilaterally CV: Irregularly irregular without murmur or gallop Abd: soft, NT, Positive BS, no hepatomegaly Ext: no C/C/E, distal pulses intact and equal Skin: warm/dry no rash  EKG:  Atrial fibrillation with rare PVC versus aberrancy, heart rate 73 beats per minute, ST and T wave abnormality likely dig effect  ASSESSMENT AND PLAN: 1. Chronic systolic heart failure. The patient has a severe nonischemic cardiomyopathy. The patient continues to be stable with no symptoms and her current activity level. She continues on a combination of furosemide, metoprolol succinate, and valsartan. No changes were made to her medical program today.  2. Essential hypertension. Blood pressure is mildly elevated today. Her readings have been in range in the past. We'll continue to monitor as she has close followup.  3. Atrial fibrillation. Heart rate is controlled. Will check a digoxin level when she has her next blood work drawn. She is anticoagulated with warfarin and has no history of  bleeding problems.  4. Mitral valve disease status post mitral valve replacement. Her exam remains within limits and there is no suggestion of mitral regurgitation.  5. Status post ICD. She is followed by Dr. Ladona Ridgel.  Tonny Bollman 05/11/2013 9:06 AM

## 2013-05-11 NOTE — Patient Instructions (Signed)
Please take lab slip order to your next visit with your PCP Dr Lovell Sheehan.   Your physician wants you to follow-up in:1 year  You will receive a reminder letter in the mail two months in advance. If you don't receive a letter, please call our office to schedule the follow-up appointment.   Your physician recommends that you continue on your current medications as directed. Please refer to the Current Medication list given to you today.

## 2013-05-21 ENCOUNTER — Encounter: Payer: Self-pay | Admitting: Internal Medicine

## 2013-06-07 ENCOUNTER — Ambulatory Visit (INDEPENDENT_AMBULATORY_CARE_PROVIDER_SITE_OTHER): Payer: Medicare PPO | Admitting: General Practice

## 2013-06-07 DIAGNOSIS — E785 Hyperlipidemia, unspecified: Secondary | ICD-10-CM

## 2013-06-07 DIAGNOSIS — I059 Rheumatic mitral valve disease, unspecified: Secondary | ICD-10-CM

## 2013-06-07 DIAGNOSIS — I4891 Unspecified atrial fibrillation: Secondary | ICD-10-CM

## 2013-06-07 DIAGNOSIS — I5032 Chronic diastolic (congestive) heart failure: Secondary | ICD-10-CM

## 2013-06-07 DIAGNOSIS — Z7901 Long term (current) use of anticoagulants: Secondary | ICD-10-CM

## 2013-06-07 LAB — POCT INR: INR: 2.2

## 2013-06-07 NOTE — Progress Notes (Signed)
Pre-visit discussion using our clinic review tool. No additional management support is needed unless otherwise documented below in the visit note.  

## 2013-06-08 LAB — CBC WITH DIFFERENTIAL/PLATELET
Basophils Absolute: 0 10*3/uL (ref 0.0–0.1)
Basophils Relative: 0.4 % (ref 0.0–3.0)
EOS PCT: 3.3 % (ref 0.0–5.0)
Eosinophils Absolute: 0.2 10*3/uL (ref 0.0–0.7)
HCT: 39.7 % (ref 36.0–46.0)
Hemoglobin: 13.5 g/dL (ref 12.0–15.0)
Lymphocytes Relative: 24.7 % (ref 12.0–46.0)
Lymphs Abs: 1.8 10*3/uL (ref 0.7–4.0)
MCHC: 34 g/dL (ref 30.0–36.0)
MCV: 91.8 fl (ref 78.0–100.0)
MONO ABS: 0.6 10*3/uL (ref 0.1–1.0)
MONOS PCT: 8 % (ref 3.0–12.0)
NEUTROS PCT: 63.6 % (ref 43.0–77.0)
Neutro Abs: 4.6 10*3/uL (ref 1.4–7.7)
PLATELETS: 182 10*3/uL (ref 150.0–400.0)
RBC: 4.32 Mil/uL (ref 3.87–5.11)
RDW: 13.6 % (ref 11.5–14.6)
WBC: 7.2 10*3/uL (ref 4.5–10.5)

## 2013-06-08 LAB — BASIC METABOLIC PANEL
BUN: 27 mg/dL — ABNORMAL HIGH (ref 6–23)
CO2: 28 meq/L (ref 19–32)
Calcium: 9.5 mg/dL (ref 8.4–10.5)
Chloride: 106 mEq/L (ref 96–112)
Creatinine, Ser: 1.7 mg/dL — ABNORMAL HIGH (ref 0.4–1.2)
GFR: 31.45 mL/min — AB (ref >60.00)
Glucose, Bld: 109 mg/dL — ABNORMAL HIGH (ref 70–99)
POTASSIUM: 5.2 meq/L — AB (ref 3.5–5.1)
SODIUM: 141 meq/L (ref 135–145)

## 2013-06-08 LAB — HEPATIC FUNCTION PANEL
ALK PHOS: 61 U/L (ref 39–117)
ALT: 25 U/L (ref 0–35)
AST: 23 U/L (ref 0–37)
Albumin: 3.9 g/dL (ref 3.5–5.2)
Bilirubin, Direct: 0.1 mg/dL (ref 0.0–0.3)
Total Bilirubin: 0.6 mg/dL (ref 0.3–1.2)
Total Protein: 7.3 g/dL (ref 6.0–8.3)

## 2013-06-08 LAB — DIGOXIN LEVEL: Digoxin Level: 0.3 ng/mL — ABNORMAL LOW (ref 0.8–2.0)

## 2013-06-15 ENCOUNTER — Telehealth: Payer: Self-pay | Admitting: Cardiovascular Disease

## 2013-06-15 DIAGNOSIS — I10 Essential (primary) hypertension: Secondary | ICD-10-CM

## 2013-06-15 NOTE — Telephone Encounter (Addendum)
Daughter called back for lab results.  Advised her that Valsartan needs to be held and Metoprolol increased to 50 mg daily.  Also need BMET in 4 weeks.  She states pt is a resident of [facility]. Phone is [phone];fax [phone].  Will call them and advised of medication change.  Also will fax order to them. Lm w/nurse to call us.  Daughter requests that labs (BMET) be done at [facility] since she is having coumadin checked on 2/19. Spoke w/[facility] and they will put with order for INR.

## 2013-06-15 NOTE — Telephone Encounter (Signed)
New message  Patients daughter is very concerned. She got a call from [facility] yesterday regarding her mothers medication changes. Please call and advise

## 2013-06-21 ENCOUNTER — Other Ambulatory Visit: Payer: Self-pay | Admitting: *Deleted

## 2013-06-21 ENCOUNTER — Encounter: Payer: Self-pay | Admitting: Nurse Practitioner

## 2013-07-16 ENCOUNTER — Ambulatory Visit (INDEPENDENT_AMBULATORY_CARE_PROVIDER_SITE_OTHER): Payer: Medicare PPO | Admitting: General Practice

## 2013-07-16 DIAGNOSIS — I10 Essential (primary) hypertension: Secondary | ICD-10-CM

## 2013-07-16 DIAGNOSIS — I4891 Unspecified atrial fibrillation: Secondary | ICD-10-CM

## 2013-07-16 LAB — BASIC METABOLIC PANEL
BUN: 19 mg/dL (ref 6–23)
CO2: 30 meq/L (ref 19–32)
Calcium: 9.1 mg/dL (ref 8.4–10.5)
Chloride: 103 mEq/L (ref 96–112)
Creatinine, Ser: 0.9 mg/dL (ref 0.4–1.2)
GFR: 62.49 mL/min (ref >60.00)
Glucose, Bld: 137 mg/dL — ABNORMAL HIGH (ref 70–99)
POTASSIUM: 4.2 meq/L (ref 3.5–5.1)
Sodium: 140 mEq/L (ref 135–145)

## 2013-07-16 LAB — POCT INR: INR: 2

## 2013-07-16 NOTE — Progress Notes (Signed)
Pre-visit discussion using our clinic review tool. No additional management support is needed unless otherwise documented below in the visit note.  

## 2013-07-19 ENCOUNTER — Ambulatory Visit: Payer: Medicare PPO

## 2013-07-24 ENCOUNTER — Encounter: Payer: Medicare PPO | Admitting: Internal Medicine

## 2013-07-30 ENCOUNTER — Encounter: Payer: Self-pay | Admitting: Internal Medicine

## 2013-07-30 ENCOUNTER — Ambulatory Visit (INDEPENDENT_AMBULATORY_CARE_PROVIDER_SITE_OTHER): Payer: Medicare PPO | Admitting: Internal Medicine

## 2013-07-30 VITALS — BP 110/74 | HR 73 | Ht 62.5 in | Wt 134.0 lb

## 2013-07-30 DIAGNOSIS — I472 Ventricular tachycardia: Secondary | ICD-10-CM

## 2013-07-30 DIAGNOSIS — I5022 Chronic systolic (congestive) heart failure: Secondary | ICD-10-CM

## 2013-07-30 DIAGNOSIS — I4729 Other ventricular tachycardia: Secondary | ICD-10-CM

## 2013-07-30 DIAGNOSIS — I4891 Unspecified atrial fibrillation: Secondary | ICD-10-CM

## 2013-07-30 DIAGNOSIS — Z95810 Presence of automatic (implantable) cardiac defibrillator: Secondary | ICD-10-CM

## 2013-07-30 DIAGNOSIS — I428 Other cardiomyopathies: Secondary | ICD-10-CM

## 2013-07-30 LAB — MDC_IDC_ENUM_SESS_TYPE_INCLINIC
Date Time Interrogation Session: 20150302050000
HIGH POWER IMPEDANCE MEASURED VALUE: 36 Ohm
HIGH POWER IMPEDANCE MEASURED VALUE: 44 Ohm
Lead Channel Pacing Threshold Amplitude: 0.6 V
Lead Channel Pacing Threshold Amplitude: 0.8 V
Lead Channel Pacing Threshold Amplitude: 0.8 V
Lead Channel Pacing Threshold Amplitude: 0.8 V
Lead Channel Pacing Threshold Pulse Width: 0.4 ms
Lead Channel Pacing Threshold Pulse Width: 0.4 ms
Lead Channel Setting Pacing Amplitude: 2.4 V
MDC IDC MSMT BATTERY VOLTAGE: 2.57 V
MDC IDC MSMT LEADCHNL RV IMPEDANCE VALUE: 738 Ohm
MDC IDC MSMT LEADCHNL RV PACING THRESHOLD PULSEWIDTH: 0.4 ms
MDC IDC MSMT LEADCHNL RV PACING THRESHOLD PULSEWIDTH: 0.4 ms
MDC IDC MSMT LEADCHNL RV SENSING INTR AMPL: 21.1 mV
MDC IDC PG SERIAL: 131373
MDC IDC SET LEADCHNL RV PACING PULSEWIDTH: 0.4 ms
MDC IDC SET ZONE DETECTION INTERVAL: 250 ms
MDC IDC SET ZONE DETECTION INTERVAL: 333 ms
MDC IDC STAT BRADY RV PERCENT PACED: 1 %
Zone Setting Detection Interval: 300 ms

## 2013-07-30 NOTE — Assessment & Plan Note (Signed)
Her heart failure symptoms are well compensated. She is re-reminded to stop Diovan. It was unclear from her [facility] records that she had actually stopped. Blood pressure is well controlled.

## 2013-07-30 NOTE — Assessment & Plan Note (Signed)
Her [employer] ICD is interogated today and found to be working normally. Will recheck in several months.

## 2013-07-30 NOTE — Patient Instructions (Addendum)
Your physician recommends that you schedule a follow-up appointment in: 3 months with the device clinic and in 12 months with Dr.Taylor  Your physician has recommended you make the following change in your medication:  1) Stop Diovan

## 2013-07-30 NOTE — Progress Notes (Signed)
HPI Mrs. Deathrage returns today for followup. She is an 80 year old woman with a history of mitral valve replacement, nonischemic cardiomyopathy, chronic systolic heart failure, status post prophylactic ICD implantation. In the interim, she has done well. She is asked to gain some weight over the last several months. Her appetite is good. She is trying to maintain a low-sodium diet. She exercises almost every day. She denies chest pain or shortness of breath. No peripheral edema. Blood work obtained back in January demonstrated worsening renal insufficiency and hyperkalemia on diovan. This medication was stopped and repeat blood work demonstrated return to normal renal function. No other complaints. Allergies  Allergen Reactions  . Simvastatin     REACTION: liver function elevation     Current Outpatient Prescriptions  Medication Sig Dispense Refill  . acetaminophen (TYLENOL) 500 MG tablet Take 2 tablets every 6-8 hours as needed for pain      . digoxin (LANOXIN) 0.125 MG tablet Take 125 mcg by mouth every other day.        . furosemide (LASIX) 40 MG tablet Take 40 mg by mouth daily.       Marland Kitchen. levothyroxine (SYNTHROID, LEVOTHROID) 25 MCG tablet Take 25 mcg by mouth daily.        . metoprolol succinate (TOPROL-XL) 25 MG 24 hr tablet Take 50 mg by mouth daily.       . multivitamin (THERAGRAN) per tablet Take 1 tablet by mouth daily.        . nitrofurantoin (MACRODANTIN) 50 MG capsule Take 50 mg by mouth daily.      . Nutritional Supplements (ENSURE HIGH PROTEIN PO) Take by mouth daily.       . OMEGA-3 KRILL OIL 300 MG CAPS Take 2 capsules by mouth 2 (two) times daily.       . sodium chloride (OCEAN) 0.65 % nasal spray 1 spray by Nasal route as needed.        . valsartan (DIOVAN) 40 MG tablet Take 40 mg by mouth daily. *BRAND NAME ONLY*      . warfarin (COUMADIN) 5 MG tablet Take as directed by the coumadin clinic      . amoxicillin (AMOXIL) 500 MG tablet Take 500 mg by mouth. 4 tabs 1 hours prior  to dental work        No current facility-administered medications for this visit.     Past Medical History  Diagnosis Date  . AF (atrial fibrillation)   . Hyperlipidemia   . Myxomatous mitral valve     post mitral valve replacement 2006  . Other primary cardiomyopathies   . Long term (current) use of anticoagulants   . CHF NYHA class III     ROS:   All systems reviewed and negative except as noted in the HPI.   Past Surgical History  Procedure Laterality Date  . S/p icd (guidant vitality icd model #t175), i mplanted 01/23/2007    . Hemorroidectomy    . Tonsillectomy    . Mitral valve replacement 2006 (cow valve)    . Implantable defebrillator       History reviewed. No pertinent family history.   History   Social History  . Marital Status: Widowed    Spouse Name: N/A    Number of Children: N/A  . Years of Education: N/A   Occupational History  . retired   [place]    Social History Main Topics  . Smoking status: Former Smoker    Quit date:  06/01/1951  . Smokeless tobacco: Not on file  . Alcohol Use: No  . Drug Use: No  . Sexual Activity: Not Currently   Other Topics Concern  . Not on file   Social History Narrative  . No narrative on file     BP 110/74  Pulse 73  Ht 5' 2.5" (1.588 m)  Wt 134 lb (60.782 kg)  BMI 24.10 kg/m2  Physical Exam:  stable appearing 80 year old woman,NAD HEENT: Unremarkable Neck - 7 cm jugular venous distention, no thyromegaly, trachea midline Back:  No CVA tenderness Lungs:  Clearwith no wheezes, rales, or rhonchi. HEART:  Regular rate rhythm, no murmurs, no rubs, no clicks Abd:  soft, positive bowel sounds, no organomegally, no rebound, no guarding Ext:  2 plus pulses, no edema, no cyanosis, no clubbing Skin:  No rashes no nodules Neuro:  CN II through XII intact, motor grossly intact  DEVICE  Normal device function.  See PaceArt for details.   Assess/Plan:

## 2013-08-08 ENCOUNTER — Encounter: Payer: Self-pay | Admitting: Internal Medicine

## 2013-08-27 ENCOUNTER — Ambulatory Visit: Payer: Medicare PPO

## 2013-08-30 ENCOUNTER — Ambulatory Visit (INDEPENDENT_AMBULATORY_CARE_PROVIDER_SITE_OTHER): Payer: Medicare PPO | Admitting: General Practice

## 2013-08-30 DIAGNOSIS — I4891 Unspecified atrial fibrillation: Secondary | ICD-10-CM

## 2013-08-30 LAB — POCT INR: INR: 2.6

## 2013-08-30 NOTE — Progress Notes (Signed)
Pre visit review using our clinic review tool, if applicable. No additional management support is needed unless otherwise documented below in the visit note. 

## 2013-09-24 ENCOUNTER — Encounter: Payer: Self-pay | Admitting: Cardiovascular Disease

## 2013-09-24 NOTE — Telephone Encounter (Signed)
This encounter was created in error - please disregard.

## 2013-09-24 NOTE — Telephone Encounter (Signed)
New message     PCP Dr. Geoffery Spruce at [facility].  is moving on to new things.     Need a referral for PCP.

## 2013-10-11 ENCOUNTER — Ambulatory Visit (INDEPENDENT_AMBULATORY_CARE_PROVIDER_SITE_OTHER): Payer: Medicare PPO | Admitting: Family

## 2013-10-11 DIAGNOSIS — I4891 Unspecified atrial fibrillation: Secondary | ICD-10-CM

## 2013-10-11 LAB — POCT INR: INR: 2.8

## 2013-10-11 NOTE — Patient Instructions (Signed)
Continue to take 2.5 mg all days except take 5 mg on M/W/F. Re-check 6 weeks.  Anticoagulation Dose Instructions as of 10/11/2013     [name] Tue Wed Thu Fri Sat   New Dose 2.5 mg 5 mg 2.5 mg 5 mg 2.5 mg 5 mg 2.5 mg    Description       Continue to take 2.5 mg all days except take 5 mg on M/W/F. Re-check 6 weeks.

## 2013-10-18 ENCOUNTER — Telehealth: Payer: Self-pay | Admitting: Internal Medicine

## 2013-10-18 NOTE — Telephone Encounter (Signed)
[facility] like has sent a physician's attending statement on 4/30 and following up on that request.  They will resend .

## 2013-10-19 NOTE — Telephone Encounter (Signed)
Waiting for Dr Lovell Sheehan signature

## 2013-10-19 NOTE — Telephone Encounter (Signed)
Form faxed

## 2013-10-31 ENCOUNTER — Ambulatory Visit (INDEPENDENT_AMBULATORY_CARE_PROVIDER_SITE_OTHER): Payer: Medicare PPO | Admitting: *Deleted

## 2013-10-31 ENCOUNTER — Encounter: Payer: Self-pay | Admitting: Internal Medicine

## 2013-10-31 DIAGNOSIS — I428 Other cardiomyopathies: Secondary | ICD-10-CM

## 2013-10-31 DIAGNOSIS — I4891 Unspecified atrial fibrillation: Secondary | ICD-10-CM

## 2013-10-31 LAB — MDC_IDC_ENUM_SESS_TYPE_INCLINIC
Battery Voltage: 2.56 V
Date Time Interrogation Session: 20150603040000
HighPow Impedance: 36 Ohm
HighPow Impedance: 44 Ohm
Lead Channel Pacing Threshold Pulse Width: 0.4 ms
Lead Channel Setting Pacing Amplitude: 2.4 V
MDC IDC MSMT LEADCHNL RV IMPEDANCE VALUE: 738 Ohm
MDC IDC MSMT LEADCHNL RV PACING THRESHOLD AMPLITUDE: 0.8 V
MDC IDC MSMT LEADCHNL RV SENSING INTR AMPL: 23 mV
MDC IDC PG SERIAL: 131373
MDC IDC SET LEADCHNL RV PACING PULSEWIDTH: 0.4 ms
MDC IDC STAT BRADY RV PERCENT PACED: 1 %
Zone Setting Detection Interval: 250 ms
Zone Setting Detection Interval: 300 ms
Zone Setting Detection Interval: 333 ms

## 2013-10-31 NOTE — Progress Notes (Signed)
ICD check in clinic. Device reached ERI on 09-27-13. Normal device function. No episodes recorded. ROV 11-21-13 @ 1615 with GT to discuss gen change.

## 2013-11-21 ENCOUNTER — Encounter: Payer: Self-pay | Admitting: Internal Medicine

## 2013-11-21 ENCOUNTER — Ambulatory Visit (INDEPENDENT_AMBULATORY_CARE_PROVIDER_SITE_OTHER): Payer: Medicare PPO | Admitting: Internal Medicine

## 2013-11-21 VITALS — BP 143/71 | HR 83 | Ht 62.0 in | Wt 133.0 lb

## 2013-11-21 DIAGNOSIS — I5022 Chronic systolic (congestive) heart failure: Secondary | ICD-10-CM

## 2013-11-21 DIAGNOSIS — Z95810 Presence of automatic (implantable) cardiac defibrillator: Secondary | ICD-10-CM

## 2013-11-22 ENCOUNTER — Encounter: Payer: Self-pay | Admitting: Internal Medicine

## 2013-11-22 ENCOUNTER — Ambulatory Visit (INDEPENDENT_AMBULATORY_CARE_PROVIDER_SITE_OTHER): Payer: Medicare PPO | Admitting: General Practice

## 2013-11-22 DIAGNOSIS — Z7189 Other specified counseling: Secondary | ICD-10-CM | POA: Insufficient documentation

## 2013-11-22 DIAGNOSIS — Z5181 Encounter for therapeutic drug level monitoring: Secondary | ICD-10-CM

## 2013-11-22 LAB — POCT INR: INR: 2

## 2013-11-22 NOTE — Assessment & Plan Note (Signed)
Her ICD is at [facility]. Her device has never gone off. With her advanced age, and lack of arrhythmias, I would suggest that she not undergo ICD generator change. I have discussed this with the patient and she prefers not to have a new device placed as well.

## 2013-11-22 NOTE — Progress Notes (Signed)
Pre visit review using our clinic review tool, if applicable. No additional management support is needed unless otherwise documented below in the visit note. 

## 2013-11-22 NOTE — Progress Notes (Signed)
HPI Mary Arellano returns today for followup. She is an 80 year old woman with a history of mitral valve replacement, nonischemic cardiomyopathy, chronic systolic heart failure, status post prophylactic ICD implantation. In the interim, she has been stable. She is asked to gain some weight over the last several months. Her appetite is good. She is trying to maintain a low-sodium diet. She denies chest pain or shortness of breath. No peripheral edema. She has reached ERI. Allergies  Allergen Reactions  . Simvastatin     REACTION: liver function elevation     Current Outpatient Prescriptions  Medication Sig Dispense Refill  . acetaminophen (TYLENOL) 500 MG tablet Take 2 tablets every 6-8 hours as needed for pain      . amoxicillin (AMOXIL) 500 MG tablet Take 500 mg by mouth. 4 tabs 1 hours prior to dental work       . digoxin (LANOXIN) 0.125 MG tablet Take 125 mcg by mouth every other day.        . furosemide (LASIX) 40 MG tablet Take 40 mg by mouth daily.       Marland Kitchen. levothyroxine (SYNTHROID, LEVOTHROID) 25 MCG tablet Take 25 mcg by mouth daily.        . metoprolol succinate (TOPROL-XL) 25 MG 24 hr tablet Take 50 mg by mouth daily.       . multivitamin (THERAGRAN) per tablet Take 1 tablet by mouth daily.        . nitrofurantoin (MACRODANTIN) 50 MG capsule Take 50 mg by mouth daily.      . Nutritional Supplements (ENSURE HIGH PROTEIN PO) Take by mouth daily.       . OMEGA-3 KRILL OIL 300 MG CAPS Take 2 capsules by mouth 2 (two) times daily.       . sodium chloride (OCEAN) 0.65 % nasal spray 1 spray by Nasal route as needed.        . warfarin (COUMADIN) 5 MG tablet Take as directed by the coumadin clinic       No current facility-administered medications for this visit.     Past Medical History  Diagnosis Date  . AF (atrial fibrillation)   . Hyperlipidemia   . Myxomatous mitral valve     post mitral valve replacement 2006  . Other primary cardiomyopathies   . Long term (current) use of  anticoagulants   . CHF NYHA class III     ROS:   All systems reviewed and negative except as noted in the HPI.   Past Surgical History  Procedure Laterality Date  . S/p icd (guidant vitality icd model #t175), i mplanted 01/23/2007    . Hemorroidectomy    . Tonsillectomy    . Mitral valve replacement 2006 (cow valve)    . Implantable defebrillator       History reviewed. No pertinent family history.   History   Social History  . Marital Status: Widowed    Spouse Name: N/A    Number of Children: N/A  . Years of Education: N/A   Occupational History  . retired   . [place]    Social History Main Topics  . Smoking status: Former Smoker    Quit date: 06/01/1951  . Smokeless tobacco: Not on file  . Alcohol Use: No  . Drug Use: No  . Sexual Activity: Not Currently   Other Topics Concern  . Not on file   Social History Narrative  . No narrative on file     BP 143/71  Pulse 83  Ht 5\' 2"  (1.575 m)  Wt 133 lb (60.328 kg)  BMI 24.32 kg/m2  Physical Exam:  Stable but chronically ill appearing 80 year old woman,NAD HEENT: Unremarkable Neck - 7 cm jugular venous distention, no thyromegaly, trachea midline Back:  No CVA tenderness Lungs:  Clearwith no wheezes, rales, or rhonchi. HEART:  Regular rate rhythm, no murmurs, no rubs, no clicks Abd:  soft, positive bowel sounds, no organomegally, no rebound, no guarding Ext:  2 plus pulses, no edema, no cyanosis, no clubbing Skin:  No rashes no nodules Neuro:  CN II through XII intact, motor grossly intact  DEVICE  Normal device function.  See PaceArt for details. Device is at [facility]  Assess/Plan:

## 2013-11-22 NOTE — Assessment & Plan Note (Signed)
Her symptoms are well controlled. She will continue her current meds. She is encouraged to maintain a low sodium diet.

## 2014-01-03 ENCOUNTER — Ambulatory Visit: Payer: Medicare PPO

## 2014-01-03 ENCOUNTER — Ambulatory Visit (INDEPENDENT_AMBULATORY_CARE_PROVIDER_SITE_OTHER): Payer: Medicare PPO | Admitting: Family

## 2014-01-03 DIAGNOSIS — I4891 Unspecified atrial fibrillation: Secondary | ICD-10-CM

## 2014-01-03 DIAGNOSIS — Z5181 Encounter for therapeutic drug level monitoring: Secondary | ICD-10-CM

## 2014-01-03 DIAGNOSIS — I482 Chronic atrial fibrillation, unspecified: Secondary | ICD-10-CM

## 2014-01-03 LAB — POCT INR: INR: 2.2

## 2014-01-03 NOTE — Patient Instructions (Signed)
Continue to take 2.5 mg all days except take 5 mg on M/W/F. Re-check 6 weeks.  Anticoagulation Dose Instructions as of 01/03/2014     [name] Tue Wed Thu Fri Sat   New Dose 2.5 mg 5 mg 2.5 mg 5 mg 2.5 mg 5 mg 2.5 mg    Description       Continue to take 2.5 mg all days except take 5 mg on M/W/F. Re-check 6 weeks.

## 2014-02-14 ENCOUNTER — Ambulatory Visit (INDEPENDENT_AMBULATORY_CARE_PROVIDER_SITE_OTHER): Payer: Medicare PPO | Admitting: Family

## 2014-02-14 DIAGNOSIS — I482 Chronic atrial fibrillation, unspecified: Secondary | ICD-10-CM

## 2014-02-14 DIAGNOSIS — I4891 Unspecified atrial fibrillation: Secondary | ICD-10-CM

## 2014-02-14 DIAGNOSIS — Z5181 Encounter for therapeutic drug level monitoring: Secondary | ICD-10-CM

## 2014-02-14 LAB — POCT INR: INR: 2.1

## 2014-02-14 NOTE — Patient Instructions (Signed)
Continue to take 2.5 mg all days except take 5 mg on M/W/F. Re-check 6 weeks.  Anticoagulation Dose Instructions as of 02/14/2014     [name] Tue Wed Thu Fri Sat   New Dose 2.5 mg 5 mg 2.5 mg 5 mg 2.5 mg 5 mg 2.5 mg    Description       Continue to take 2.5 mg all days except take 5 mg on M/W/F. Re-check 6 weeks.

## 2014-03-28 ENCOUNTER — Ambulatory Visit (INDEPENDENT_AMBULATORY_CARE_PROVIDER_SITE_OTHER): Payer: Medicare PPO | Admitting: Family

## 2014-03-28 ENCOUNTER — Ambulatory Visit: Payer: Medicare PPO

## 2014-03-28 DIAGNOSIS — Z5181 Encounter for therapeutic drug level monitoring: Secondary | ICD-10-CM

## 2014-03-28 DIAGNOSIS — I482 Chronic atrial fibrillation, unspecified: Secondary | ICD-10-CM

## 2014-03-28 LAB — POCT INR: INR: 2.3

## 2014-03-28 NOTE — Patient Instructions (Signed)
Continue to take 2.5 mg all days except take 5 mg on M/W/F. Re-check 6 weeks.  Anticoagulation Dose Instructions as of 03/28/2014     Mary Arellano Tue Wed Thu Fri Sat   New Dose 2.5 mg 5 mg 2.5 mg 5 mg 2.5 mg 5 mg 2.5 mg    Description       Continue to take 2.5 mg all days except take 5 mg on M/W/F. Re-check 6 weeks.    '

## 2014-05-02 ENCOUNTER — Encounter: Payer: Self-pay | Admitting: Cardiovascular Disease

## 2014-05-02 ENCOUNTER — Ambulatory Visit (INDEPENDENT_AMBULATORY_CARE_PROVIDER_SITE_OTHER): Payer: Medicare PPO | Admitting: Cardiovascular Disease

## 2014-05-02 VITALS — BP 128/72 | HR 72 | Ht 62.0 in | Wt 132.8 lb

## 2014-05-02 DIAGNOSIS — I059 Rheumatic mitral valve disease, unspecified: Secondary | ICD-10-CM

## 2014-05-02 DIAGNOSIS — E785 Hyperlipidemia, unspecified: Secondary | ICD-10-CM

## 2014-05-02 DIAGNOSIS — I5022 Chronic systolic (congestive) heart failure: Secondary | ICD-10-CM

## 2014-05-02 NOTE — Progress Notes (Signed)
Background: The patient has a history of nonischemic cardiomyopathy, mitral regurgitation status post mitral valve replacement with a bioprosthetic valve, and ICD implantation for primary prevention.  At the time of last EP evaluation, [facility] and the decision was made to NOT pursue generator change considering her clinical stability and advanced age.   HPI:   80 year old woman presenting for follow-up evaluation. She continues to do well from a cardiac perspective. She denies chest pain, chest pressure, heart palpitations, shortness of breath, edema, lightheadedness, or syncope. She has no complaints today.   Outpatient Encounter Prescriptions as of 05/02/2014  Medication Sig  . acetaminophen (TYLENOL) 500 MG tablet Take 2 tablets every 6-8 hours as needed for pain  . amoxicillin (AMOXIL) 500 MG tablet Take 500 mg by mouth. 4 tabs 1 hours prior to dental work   . digoxin (LANOXIN) 0.125 MG tablet Take 125 mcg by mouth every other day.    . furosemide (LASIX) 40 MG tablet Take 40 mg by mouth daily.   Marland Kitchen. levothyroxine (SYNTHROID, LEVOTHROID) 25 MCG tablet Take 25 mcg by mouth daily.    . metoprolol (LOPRESSOR) 50 MG tablet Take 50 mg by mouth daily.  . multivitamin (THERAGRAN) per tablet Take 1 tablet by mouth daily.    . nitrofurantoin (MACRODANTIN) 50 MG capsule Take 50 mg by mouth daily.  . Nutritional Supplements (ENSURE HIGH PROTEIN PO) Take by mouth daily.   . OMEGA-3 KRILL OIL 300 MG CAPS Take 2 capsules by mouth 2 (two) times daily.   . sodium chloride (OCEAN) 0.65 % nasal spray 1 spray by Nasal route as needed.    . warfarin (COUMADIN) 5 MG tablet Take as directed by the coumadin clinic  . [DISCONTINUED] metoprolol succinate (TOPROL-XL) 25 MG 24 hr tablet Take 50 mg by mouth daily.     Allergies  Allergen Reactions  . Simvastatin     REACTION: liver function elevation    Past Medical History  Diagnosis Date  . AF (atrial fibrillation)   . Hyperlipidemia   .  Myxomatous mitral valve     post mitral valve replacement 2006  . Other primary cardiomyopathies   . Long term (current) use of anticoagulants   . CHF NYHA class III     family history is not on file.   ROS: Negative except as per HPI  BP 128/72 mmHg  Pulse 72  Ht 5\' 2"  (1.575 m)  Wt 132 lb 12.8 oz (60.238 kg)  BMI 24.28 kg/m2  PHYSICAL EXAM: Pt is alert and oriented, NAD HEENT: normal Neck: JVP - normal, carotids 2+= without bruits Lungs: CTA bilaterally CV:  Irregularly irregular without murmur or gallop Abd: soft, NT, Positive BS, no hepatomegaly Ext: no C/C/E, distal pulses intact and equal Skin: warm/dry no rash  EKG:   Atrial fibrillation 72 bpm ST and T-wave abnormality likely digitalis effect  ASSESSMENT AND PLAN:  1. Chronic systolic heart failure with underlying nonischemic cardiomyopathy. The patient is stable with [place] Heart Association functional class I symptoms. Her current medical program was reviewed and it will be continued without changes.  2. Essential hypertension. Blood pressure is well controlled on current medicines.  3. Chronic atrial fibrillation. Heart rate is controlled. Digoxin level has been within normal range. The patient is tolerating chronic anticoagulation with warfarin and has no problems with bleeding.  4. Mitral valve disorder status post mitral valve replacement. Exam suggests continued normal function of her valve prosthesis.   5. Status post  ICD, device and active with no plans for generator change because of clinical stability and advanced age.  Tonny Bollman, MD 05/02/2014 11:25 AM

## 2014-05-02 NOTE — Patient Instructions (Signed)
Your physician wants you to follow-up in: 1 YEAR with Dr Excell Seltzer.  You will receive a reminder letter in the mail two months in advance. If you don't receive a letter, please call our office to schedule the follow-up appointment.   Your physician recommends that you continue on your current medications as directed. Please refer to the Current Medication list given to you today.  We will cancel appointment with Dr Ladona Ridgel now that your ICD has been turned off.

## 2014-05-09 ENCOUNTER — Ambulatory Visit: Payer: Medicare PPO | Admitting: Family

## 2014-05-16 ENCOUNTER — Ambulatory Visit (INDEPENDENT_AMBULATORY_CARE_PROVIDER_SITE_OTHER): Payer: Medicare PPO | Admitting: Family

## 2014-05-16 DIAGNOSIS — I482 Chronic atrial fibrillation, unspecified: Secondary | ICD-10-CM

## 2014-05-16 DIAGNOSIS — Z5181 Encounter for therapeutic drug level monitoring: Secondary | ICD-10-CM

## 2014-05-16 LAB — POCT INR: INR: 2.7

## 2014-05-16 NOTE — Patient Instructions (Signed)
Continue to take 2.5 mg all days except take 5 mg on M/W/F. Re-check 6 weeks.  Anticoagulation Dose Instructions as of 05/16/2014      [name] Tue Wed Thu Fri Sat   New Dose 2.5 mg 5 mg 2.5 mg 5 mg 2.5 mg 5 mg 2.5 mg    Description        Continue to take 2.5 mg all days except take 5 mg on M/W/F. Re-check 6 weeks.

## 2014-06-27 ENCOUNTER — Ambulatory Visit (INDEPENDENT_AMBULATORY_CARE_PROVIDER_SITE_OTHER): Payer: Medicare PPO | Admitting: Family

## 2014-06-27 DIAGNOSIS — I482 Chronic atrial fibrillation, unspecified: Secondary | ICD-10-CM

## 2014-06-27 DIAGNOSIS — Z5181 Encounter for therapeutic drug level monitoring: Secondary | ICD-10-CM

## 2014-06-27 LAB — POCT INR: INR: 2.7

## 2014-06-27 NOTE — Patient Instructions (Signed)
Continue to take 2.5 mg all days except take 5 mg on M/W/F. Re-check 6 weeks.  Anticoagulation Dose Instructions as of 06/27/2014      [name] Tue Wed Thu Fri Sat   New Dose 2.5 mg 5 mg 2.5 mg 5 mg 2.5 mg 5 mg 2.5 mg    Description        Continue to take 2.5 mg all days except take 5 mg on M/W/F. Re-check 6 weeks.

## 2014-07-30 ENCOUNTER — Encounter: Payer: Medicare PPO | Admitting: Internal Medicine

## 2014-08-08 ENCOUNTER — Ambulatory Visit (INDEPENDENT_AMBULATORY_CARE_PROVIDER_SITE_OTHER): Payer: Medicare PPO | Admitting: Family

## 2014-08-08 ENCOUNTER — Ambulatory Visit: Payer: Medicare PPO

## 2014-08-08 DIAGNOSIS — Z5181 Encounter for therapeutic drug level monitoring: Secondary | ICD-10-CM

## 2014-08-08 DIAGNOSIS — I482 Chronic atrial fibrillation, unspecified: Secondary | ICD-10-CM

## 2014-08-08 LAB — POCT INR: INR: 2.6

## 2014-08-08 NOTE — Patient Instructions (Signed)
Continue to take 2.5 mg all days except take 5 mg on M/W/F. Re-check 6 weeks.  Anticoagulation Dose Instructions as of 08/08/2014      [name] Tue Wed Thu Fri Sat   New Dose 2.5 mg 5 mg 2.5 mg 5 mg 2.5 mg 5 mg 2.5 mg    Description        Continue to take 2.5 mg all days except take 5 mg on M/W/F. Re-check 6 weeks.

## 2014-08-20 ENCOUNTER — Telehealth: Payer: Self-pay | Admitting: Internal Medicine

## 2014-08-20 NOTE — Telephone Encounter (Signed)
Returned called to staff member at [facility] regarding request for signed order/prescription for Digoxin.  I advised Andrel that pt has not been seen by a physician since 08/04/11 and we are unable to sign order for medication unless the patient establishes care with one of our providers since Dr. Lovell Sheehan is no longer practicing at our office.

## 2014-08-26 ENCOUNTER — Telehealth: Payer: Self-pay | Admitting: Cardiovascular Disease

## 2014-08-26 ENCOUNTER — Telehealth: Payer: Self-pay

## 2014-08-26 NOTE — Telephone Encounter (Signed)
Patient's daughter states Dr. Ruthe Mannan wrote a rx for it but the directions are to take "daily" instead of "every other day". Patient's daughter is going to contact patient's cardiologist about the medication.   She also asked for patient to go on the waiting list for [facility].

## 2014-08-26 NOTE — Telephone Encounter (Signed)
I spoke with [place] and she said that Dr York Ram has retired and the office did not reassign his patient's to another provider. She was told it would be next year before the pt could establish with another provider.  The pt's medications were filled by Dr York Ram and once her refills run out she does not have another PCP who can authorize refills.  I made Jenna aware that Dr Excell Seltzer can fill the pt's medications until the pt can re-establish with new PCP.  Eileen Stanford is unsure of when the pt's other medications will need to be refilled.  She will contact the pt's facility and find out what needs to be done by our office to get the pt her medications. The pt ran out of her digoxin this weekend and because she did not have any refills the facility did not give her any medication and this is what brought this to Jenna's attention. Eileen Stanford will contact our office once she has more information about the pt's medications.

## 2014-08-26 NOTE — Telephone Encounter (Signed)
PLEASE NOTE: All timestamps contained within this report are represented as [place] Standard Time. CONFIDENTIALTY NOTICE: This fax transmission is intended only for the addressee. It contains information that is legally privileged, confidential or otherwise protected from use or disclosure. If you are not the intended recipient, you are strictly prohibited from reviewing, disclosing, copying using or disseminating any of this information or taking any action in reliance on or regarding this information. If you have received this fax in error, please notify us immediately by telephone so that we can arrange for its return to [place]. Phone: [phone], Toll-Free: [phone], Fax: [phone] Page: 1 of 3 Call Id: [id number] [facility] - Client TELEPHONE ADVICE RECORD [facility] Call [facility] Patient Name: Mary Arellano Gender: Female DOB: 08-21-1926 Age: 81 Y 9 M 26 D Return Phone Number: [phone] (Primary) Address: City/State/Zip: [place] [address] Client [facility] - Client Client Site [facility] - Night Physician Adline Mango Contact Type Call Call Type Triage / Clinical Caller Name Devonne Doughty Relationship To Patient Mother Return Phone Number [phone] (Primary) Chief Complaint Prescription Refill or Medication Request (non symptomatic) Initial Comment Caller states her mother is out of her medication she has been out for 2 days. She is in an ALF . GOTO Facility Not Listed MD page Nurse Assessment Nurse: Harlon Flor, RN, Darl Pikes Date/Time (Eastern Time): 08/24/2014 3:28:45 PM Confirm and document reason for call. If symptomatic, describe symptoms. ---Caller states her mother is out of her medication she has been out for 2 days She is in an Assisted Living . who tells dtr they have faxed the request since 07/24/14 and no refills authed. Has the patient traveled out of the country  within the last 30 days? ---No Does the patient require triage? ---No Please document clinical information provided and list any resource used. ---RN advised dtr will page the oncall MD Dtr verbalized understanding Guidelines Guideline Title Affirmed Question Affirmed Notes Nurse Date/Time (Eastern Time) Disp. Time Lamount Cohen Time) Disposition Final User 08/24/2014 3:46:17 PM Called On-Call Provider Harlon Flor, RN, Darl Pikes 08/24/2014 3:54:11 PM Pharmacy Call Harlon Flor, RN, Darl Pikes [phone] left new order on voice mail . dtr advised med called in. 08/24/2014 3:58:19 PM Call Completed Harlon Flor, RN, Darl Pikes 08/24/2014 3:49:02 PM Clinical Call Yes Harlon Flor, RN, Darl Pikes After Care Instructions Given Call Event Type User Date / Time Description PLEASE NOTE: All timestamps contained within this report are represented as [place] Standard Time. CONFIDENTIALTY NOTICE: This fax transmission is intended only for the addressee. It contains information that is legally privileged, confidential or otherwise protected from use or disclosure. If you are not the intended recipient, you are strictly prohibited from reviewing, disclosing, copying using or disseminating any of this information or taking any action in reliance on or regarding this information. If you have received this fax in error, please notify us immediately by telephone so that we can arrange for its return to [place]. Phone: [phone], Toll-Free: [phone], Fax: [phone] Page: 2 of 3 Call Id: [id number] Verbal Orders/Maintenance Medications Medication Refill Route Dosage Regime Duration Admin Instructions User Name Digoxin one PO every other day (due today 08/24/14) Oral Days Dispense : # 30 no refills Pt lives in ALF and will have to be seen with a new PCP dtr has to make appt with the new MD . Harlon Flor, RN, Clemetine Marker DoctorName DoctorPhone DateTime Result/Outcome Notes Ruthe Mannan [id number] 08/24/2014  3:46:17 PM Called On Call Provider - Lacretia Leigh, Jovita Gamma 08/24/2014  3:47:32 PM Spoke with On Call - General RN give VO to call in DIgoxin 125 MCG one PO every other day (due today 07/26/14 ) from Dr Dayton Martes. RN read back order See Verbal order . [facility]tr [facility] and take to ALF [facility]. PLEASE NOTE: All timestamps contained within this report are represented as [place] Standard Time. CONFIDENTIALTY NOTICE: This fax transmission is intended only for the addressee. It contains information that is legally privileged, confidential or otherwise protected from use or disclosure. If you are not the intended recipient, you are strictly prohibited from reviewing, disclosing, copying using or disseminating any of this information or taking any action in reliance on or regarding this information. If you have received this fax in error, please notify us immediately by telephone so that we can arrange for its return to [place]. Phone: [phone], Toll-Free: [phone], Fax: [phone] Page: 3 of 3 Call Id: [id number] [facility] [address], [address] [place], [place] [address] [phone] Fax: [phone] MEDICATION ORDER [facility] - Client [facility] - Night Date: 08/24/2014 From: QI Department To: Adline Mango Please sign the order for the approved drug(s) given by our call center nurse on your behalf. Fax to [phone] within 5 business days. Thank you. Date Lamount Cohen Time): 08/24/2014 2:23:06 PM Triage RN: Sabino Snipes, RN NAME: Imogene Burn PHONE NUMBER: [phone] (Primary) BIRTHDATE: Nov 12, 1926 [place] [address] CALLER: Mother NAME: Devonne Doughty Rx Given Medication Refill Route Dosage Regime Duration Admin Instructions Digoxin one PO every other day (due today 08/24/14) Oral Days Dispense : # 30 no lives in ALF and will be  seen with a new has to make appt with MD . MD Signature Date   A message has been sent to Cardiology as well on 3.28.2016

## 2014-08-26 NOTE — Telephone Encounter (Signed)
PLEASE NOTE: All timestamps contained within this report are represented as [place] Standard Time. CONFIDENTIALTY NOTICE: This fax transmission is intended only for the addressee. It contains information that is legally privileged, confidential or otherwise protected from use or disclosure. If you are not the intended recipient, you are strictly prohibited from reviewing, disclosing, copying using or disseminating any of this information or taking any action in reliance on or regarding this information. If you have received this fax in error, please notify us immediately by telephone so that we can arrange for its return to [place]. Phone: [phone], Toll-Free: [phone], Fax: [phone] Page: 1 of 3 Call Id: [id number] [facility] - Client TELEPHONE ADVICE RECORD [facility] Call Center Patient Name: Mary Arellano Gender: Female DOB: 05-16-1927 Age: 81 Y 9 M 26 D Return Phone Number: [phone] (Primary) Address: City/State/Zip: [place] [address] Client [facility] - Client Client Site [facility] - Night Contact Type Call Call Type Triage / Clinical Caller Name Mary Arellano Relationship To Patient Daughter Return Phone Number [phone] (Primary) Chief Complaint Prescription Refill or Medication Request (non symptomatic) Initial Comment Caller states has been three hours and rx has not been sent to pharmacy. Daughter is calling PreDisposition Call Doctor Nurse Assessment Nurse: Para March, RN, Gavin Pound Date/Time Lamount Cohen Time): 08/24/2014 7:21:02 PM Confirm and document reason for call. If symptomatic, describe symptoms. ---Caller states patient's digoxin order has not been sent to the pharmacy ( see previous record) Has the patient traveled out of the country within the last 30 days? ---Not Applicable Does the patient require triage? ---No Guidelines Guideline Title Affirmed Question  Affirmed Notes Nurse Date/Time Lamount Cohen Time) Medication Question Call [1] Request for URGENT new prescription or refill of "essential" medication (i.e., likelihood of harm to patient if not taken) AND [2] triager unable to fill per unit policy Para March, RN, Gavin Pound 08/24/2014 7:29:31 PM Disp. Time Lamount Cohen Time) Disposition Final User 08/24/2014 7:28:08 PM Paged On Call back to [facility], RN, Gavin Pound 08/24/2014 7:32:01 PM Pharmacy Call Para March, RN, Gavin Pound Reason: Pharmacy states they have not received the prescription. 08/24/2014 7:35:12 PM Pharmacy Call Para March, RN, Gavin Pound Reason: Prescription left on pharmacy recording. Called and advised caller. Caller verbalized understanding. 08/24/2014 7:35:25 PM Call Completed Para March, RN, Gavin Pound 08/24/2014 7:29:58 PM Call PCP Now Yes Para March, RN, Lynnea Maizes NOTE: All timestamps contained within this report are represented as [place] Standard Time. CONFIDENTIALTY NOTICE: This fax transmission is intended only for the addressee. It contains information that is legally privileged, confidential or otherwise protected from use or disclosure. If you are not the intended recipient, you are strictly prohibited from reviewing, disclosing, copying using or disseminating any of this information or taking any action in reliance on or regarding this information. If you have received this fax in error, please notify us immediately by telephone so that we can arrange for its return to [place]. Phone: [phone], Toll-Free: [phone], Fax: [phone] Page: 2 of 3 Call Id: [id number] Caller Understands: Yes Disagree/Comply: Comply Care Advice Given Per Guideline CALL PCP NOW: You need to discuss this with your doctor. I'll page him now. If you haven't heard from the on-call doctor within 30 minutes, call again. CARE ADVICE given per Medication Question Call (Adult) guideline. After Care Instructions Given Call Event Type User Date / Time Description Verbal  Orders/Maintenance Medications Medication Refill Route Dosage Regime Duration Admin Instructions User Name Dignoxin 0.125 po qddispense 30- no refill Oral 1 qd 30 Days Dispense  30- no refill Daoud, RN, Gavin Pound Comments User: Shirley Muscat Date/Time Lamount Cohen Time): 08/24/2014 6:37:09 PM Daughter thinks medicine is for heart rythym Paging DoctorName DoctorPhone DateTime Result/Outcome Notes Ruthe Mannan [phone] 08/24/2014 7:28:08 PM Paged On Call Back to Call Center [phone] Ruthe Mannan 08/24/2014 7:30:49 PM Spoke with On Call - General PLEASE NOTE: All timestamps contained within this report are represented as [place] Standard Time. CONFIDENTIALTY NOTICE: This fax transmission is intended only for the addressee. It contains information that is legally privileged, confidential or otherwise protected from use or disclosure. If you are not the intended recipient, you are strictly prohibited from reviewing, disclosing, copying using or disseminating any of this information or taking any action in reliance on or regarding this information. If you have received this fax in error, please notify us immediately by telephone so that we can arrange for its return to [place]. Phone: [phone], Toll-Free: [phone], Fax: [phone] Page: 3 of 3 Call Id: [id number] [facility] [address], [address] [place], [place] [address] [phone] Fax: [phone] MEDICATION ORDER [facility] - Client [facility] - Night Date: 08/24/2014 From: QI Department To: Please sign the order for the approved drug(s) given by our call center nurse on your behalf. Fax to [phone] within 5 business days. Thank you. Date Lamount Cohen Time): 08/24/2014 6:32:56 PM Triage RN: Desiree Hane, RN NAME: Imogene Burn PHONE NUMBER: [phone] (Primary) BIRTHDATE: 10-01-1926 [place] [address] CALLER: Daughter NAME: Mary Arellano Rx Given Medication Refill Route Dosage Regime Duration Admin Instructions Dignoxin 0.125 po qddispense 30- no refill Oral 1 qd 30 Days Dispense 30- no refill MD Signature Date

## 2014-08-26 NOTE — Telephone Encounter (Signed)
New Msg         Pt daughter calling, states she has some questions but would prefer to speak with nurse because story is lenthy.   Please return call.

## 2014-09-19 ENCOUNTER — Ambulatory Visit (INDEPENDENT_AMBULATORY_CARE_PROVIDER_SITE_OTHER): Payer: Medicare PPO | Admitting: General Practice

## 2014-09-19 DIAGNOSIS — Z5181 Encounter for therapeutic drug level monitoring: Secondary | ICD-10-CM | POA: Diagnosis not present

## 2014-09-19 LAB — POCT INR: INR: 2.4

## 2014-09-19 NOTE — Progress Notes (Signed)
Pre visit review using our clinic review tool, if applicable. No additional management support is needed unless otherwise documented below in the visit note. 

## 2014-10-31 ENCOUNTER — Ambulatory Visit: Payer: Medicare PPO

## 2014-10-31 ENCOUNTER — Ambulatory Visit: Payer: Medicare PPO | Admitting: Adult Health

## 2014-11-04 ENCOUNTER — Ambulatory Visit: Payer: Medicare PPO | Admitting: Adult Health

## 2014-11-06 ENCOUNTER — Ambulatory Visit: Payer: Medicare PPO | Admitting: Adult Health

## 2014-11-07 ENCOUNTER — Encounter: Payer: Self-pay | Admitting: Adult Health

## 2014-11-07 ENCOUNTER — Ambulatory Visit (INDEPENDENT_AMBULATORY_CARE_PROVIDER_SITE_OTHER): Payer: Medicare PPO | Admitting: General Practice

## 2014-11-07 ENCOUNTER — Ambulatory Visit: Payer: Medicare PPO

## 2014-11-07 ENCOUNTER — Ambulatory Visit (INDEPENDENT_AMBULATORY_CARE_PROVIDER_SITE_OTHER): Payer: Medicare PPO | Admitting: Adult Health

## 2014-11-07 VITALS — Temp 97.7°F | Ht 62.0 in | Wt 129.1 lb

## 2014-11-07 DIAGNOSIS — Z5181 Encounter for therapeutic drug level monitoring: Secondary | ICD-10-CM

## 2014-11-07 DIAGNOSIS — I4891 Unspecified atrial fibrillation: Secondary | ICD-10-CM

## 2014-11-07 DIAGNOSIS — Z7189 Other specified counseling: Secondary | ICD-10-CM | POA: Diagnosis not present

## 2014-11-07 DIAGNOSIS — Z780 Asymptomatic menopausal state: Secondary | ICD-10-CM

## 2014-11-07 DIAGNOSIS — Z7689 Persons encountering health services in other specified circumstances: Secondary | ICD-10-CM

## 2014-11-07 LAB — POCT INR: INR: 1.9

## 2014-11-07 NOTE — Progress Notes (Signed)
HPI:  Mary Arellano is here to establish care. She is a very pleasant caucasian female who was previously seen by Dr. Lovell Sheehan in the past. She currently lives in an assisted living facility [facility] [place] [address] has been there 9 years and gets most of her medical there.    Last PCP and physical:Unknown  Has the following chronic problems that require follow up and concerns today:  Hand Pain - She complains of bilateral thumb pain when moving them in certain directions. This is not a constant issues and only happens on occasion. Has not tried anything for this pain.    She is followed by Dr. Excell Seltzer. Of Cardiology - Has had no recent issues with cardiac health.   ROS negative for unless reported above: fevers, chills,feeling poorly, unintentional weight loss, hearing or vision loss, chest pain, palpitations, leg claudication, struggling to breath,Not feeling congested in the chest, no orthopenia, no cough,no wheezing, normal appetite, no soft tissue swelling, no hemoptysis, melena, hematochezia, hematuria, falls, loc, si, or thoughts of self harm.  Immunizations:Needs shingles and Tdap Diet:Eats at retirement facility, has healthy diet.  Exercise:Exercises for an hour every morning Colonoscopy:Does not remember Dexa:Would like one scheduled Mammogram: Does not want  Past Medical History  Diagnosis Date  . AF (atrial fibrillation)   . Hyperlipidemia   . Myxomatous mitral valve     post mitral valve replacement 2006  . Other primary cardiomyopathies   . Long term (current) use of anticoagulants   . CHF NYHA class III     Past Surgical History  Procedure Laterality Date  . S/p icd (guidant vitality icd model #t175), i mplanted 01/23/2007    . Hemorroidectomy    . Tonsillectomy    . Mitral valve replacement 2006 (cow valve)    . Implantable defebrillator      No family history on file.  History   Social History  . Marital Status: Widowed    Spouse  Name: N/A  . Number of Children: N/A  . Years of Education: N/A   Occupational History  . retired   . [place]    Social History Main Topics  . Smoking status: Former Smoker    Quit date: 06/01/1951  . Smokeless tobacco: Not on file  . Alcohol Use: No  . Drug Use: No  . Sexual Activity: Not Currently   Other Topics Concern  . None   Social History Narrative     Current outpatient prescriptions:  .  digoxin (LANOXIN) 0.125 MG tablet, Take 125 mcg by mouth every other day.  , Disp: , Rfl:  .  furosemide (LASIX) 40 MG tablet, Take 40 mg by mouth daily. , Disp: , Rfl:  .  levothyroxine (SYNTHROID, LEVOTHROID) 25 MCG tablet, Take 25 mcg by mouth daily.  , Disp: , Rfl:  .  metoprolol (LOPRESSOR) 50 MG tablet, Take 50 mg by mouth daily., Disp: , Rfl:  .  multivitamin (THERAGRAN) per tablet, Take 1 tablet by mouth daily.  , Disp: , Rfl:  .  nitrofurantoin (MACRODANTIN) 50 MG capsule, Take 50 mg by mouth daily., Disp: , Rfl:  .  Nutritional Supplements (ENSURE HIGH PROTEIN PO), Take by mouth daily. , Disp: , Rfl:  .  OMEGA-3 KRILL OIL 300 MG CAPS, Take 2 capsules by mouth 2 (two) times daily. , Disp: , Rfl:  .  sodium chloride (OCEAN) 0.65 % nasal spray, 1 spray by Nasal route as needed.  , Disp: ,  Rfl:  .  warfarin (COUMADIN) 2.5 MG tablet, Take 2.5 mg by mouth daily., Disp: , Rfl:  .  warfarin (COUMADIN) 5 MG tablet, Take as directed by the coumadin clinic, Disp: , Rfl:  .  acetaminophen (TYLENOL) 500 MG tablet, Take 2 tablets every 6-8 hours as needed for pain, Disp: , Rfl:  .  amoxicillin (AMOXIL) 500 MG tablet, Take 500 mg by mouth. 4 tabs 1 hours prior to dental work , Disp: , Rfl:   EXAM:  Filed Vitals:   11/07/14 1017  Temp: 97.7 F (36.5 C)    Body mass index is 23.61 kg/(m^2).  GENERAL: vitals reviewed and listed above, alert, oriented, appears well hydrated and in no acute distress.PAce maker present but no longer active  HEENT: atraumatic,  conjunttiva clear, no obvious abnormalities on inspection of external nose and ears. She has many filled cavities. She is wearing glasses. No cerumen impaction and TM's visualized  NECK: Neck is soft and supple without masses, no adenopathy or thyromegaly, trachea midline, no JVD. Normal range of motion.   LUNGS: clear to auscultation bilaterally, no wheezes, rales or rhonchi, good air movement.  CV: Regular rate and rhythm, normal S1/S2, no audible murmurs, gallops, or rubs. No carotid bruit and no peripheral edema.   MS: moves all extremities without noticeable abnormality. No edema noted  Abd: soft/nontender/nondistended/normal bowel sounds   Skin: warm and dry, no rash. Has  dry rough skin on face and bilateral lower extremities.   Extremities: No clubbing, cyanosis, or edema. Capillary refill is WNL. Pulses intact bilaterally in upper and lower extremities.   Neuro: CN II-XII intact, sensation and reflexes normal throughout, 5/5 muscle strength in bilateral upper and lower extremities. Normal finger to nose. Normal rapid alternating movements.   PSYCH: pleasant and cooperative, no obvious depression or anxiety  ASSESSMENT AND PLAN:   1. Encounter to establish care - She is going to follow up in 4 weeks for MWE.  - Will get labs at that visit - Follow up with me if needed   2. Post-menopausal - DG Bone Density; Future   Discussed the following assessment and plan:  There are no diagnoses linked to this encounter.   No diagnosis found. -We reviewed the PMH, PSH, FH, SH, Meds and Allergies. -We provided refills for any medications we will prescribe as needed. -We addressed current concerns per orders and patient instructions. -We have asked for records for pertinent exams, studies, vaccines and notes from previous providers. -We have advised patient to follow up per instructions below.   -Patient advised to return or notify a provider immediately if symptoms worsen or  persist or new concerns arise.  There are no Patient Instructions on file for this visit.   [employer]

## 2014-11-07 NOTE — Progress Notes (Signed)
Pre visit review using our clinic review tool, if applicable. No additional management support is needed unless otherwise documented below in the visit note. 

## 2014-11-07 NOTE — Patient Instructions (Signed)
It was so nice meeting you today!   Please follow up with me in a month for your medicare wellness exam. If you need anything in the mean time, please let me know.

## 2014-11-08 ENCOUNTER — Other Ambulatory Visit: Payer: Self-pay | Admitting: Adult Health

## 2014-12-05 ENCOUNTER — Encounter: Payer: Self-pay | Admitting: Adult Health

## 2014-12-05 ENCOUNTER — Ambulatory Visit (INDEPENDENT_AMBULATORY_CARE_PROVIDER_SITE_OTHER): Payer: Medicare PPO | Admitting: General Practice

## 2014-12-05 ENCOUNTER — Ambulatory Visit (INDEPENDENT_AMBULATORY_CARE_PROVIDER_SITE_OTHER): Payer: Medicare PPO | Admitting: Adult Health

## 2014-12-05 ENCOUNTER — Ambulatory Visit: Payer: Medicare PPO

## 2014-12-05 VITALS — BP 132/68 | Temp 97.5°F | Wt 128.9 lb

## 2014-12-05 DIAGNOSIS — Z780 Asymptomatic menopausal state: Secondary | ICD-10-CM

## 2014-12-05 DIAGNOSIS — E785 Hyperlipidemia, unspecified: Secondary | ICD-10-CM

## 2014-12-05 DIAGNOSIS — Z5181 Encounter for therapeutic drug level monitoring: Secondary | ICD-10-CM | POA: Diagnosis not present

## 2014-12-05 DIAGNOSIS — I4891 Unspecified atrial fibrillation: Secondary | ICD-10-CM

## 2014-12-05 DIAGNOSIS — Z23 Encounter for immunization: Secondary | ICD-10-CM

## 2014-12-05 DIAGNOSIS — Z0000 Encounter for general adult medical examination without abnormal findings: Secondary | ICD-10-CM | POA: Diagnosis not present

## 2014-12-05 LAB — CBC WITH DIFFERENTIAL/PLATELET
Basophils Absolute: 0 10*3/uL (ref 0.0–0.1)
Basophils Relative: 0.8 % (ref 0.0–3.0)
EOS PCT: 4 % (ref 0.0–5.0)
Eosinophils Absolute: 0.3 10*3/uL (ref 0.0–0.7)
HCT: 40.3 % (ref 36.0–46.0)
Hemoglobin: 13.5 g/dL (ref 12.0–15.0)
LYMPHS ABS: 1.9 10*3/uL (ref 0.7–4.0)
Lymphocytes Relative: 29.4 % (ref 12.0–46.0)
MCHC: 33.5 g/dL (ref 30.0–36.0)
MCV: 91.8 fl (ref 78.0–100.0)
MONO ABS: 0.5 10*3/uL (ref 0.1–1.0)
MONOS PCT: 8.1 % (ref 3.0–12.0)
Neutro Abs: 3.6 10*3/uL (ref 1.4–7.7)
Neutrophils Relative %: 57.7 % (ref 43.0–77.0)
PLATELETS: 180 10*3/uL (ref 150.0–400.0)
RBC: 4.39 Mil/uL (ref 3.87–5.11)
RDW: 14 % (ref 11.5–15.5)
WBC: 6.3 10*3/uL (ref 4.0–10.5)

## 2014-12-05 LAB — BASIC METABOLIC PANEL
BUN: 22 mg/dL (ref 6–23)
CO2: 30 mEq/L (ref 19–32)
CREATININE: 1.1 mg/dL (ref 0.40–1.20)
Calcium: 9.3 mg/dL (ref 8.4–10.5)
Chloride: 102 mEq/L (ref 96–112)
GFR: 50.04 mL/min — AB (ref >60.00)
GLUCOSE: 98 mg/dL (ref 70–99)
Potassium: 3.7 mEq/L (ref 3.5–5.1)
Sodium: 140 mEq/L (ref 135–145)

## 2014-12-05 LAB — POCT URINALYSIS DIPSTICK
BILIRUBIN UA: NEGATIVE
Blood, UA: NEGATIVE
GLUCOSE UA: NEGATIVE
Ketones, UA: NEGATIVE
LEUKOCYTES UA: NEGATIVE
NITRITE UA: NEGATIVE
PH UA: 6
Protein, UA: NEGATIVE
Spec Grav, UA: 1.025
Urobilinogen, UA: 0.2

## 2014-12-05 LAB — HEPATIC FUNCTION PANEL
ALT: 27 U/L (ref 0–35)
AST: 25 U/L (ref 0–37)
Albumin: 3.8 g/dL (ref 3.5–5.2)
Alkaline Phosphatase: 58 U/L (ref 39–117)
BILIRUBIN DIRECT: 0.1 mg/dL (ref 0.0–0.3)
TOTAL PROTEIN: 7.1 g/dL (ref 6.0–8.3)
Total Bilirubin: 0.7 mg/dL (ref 0.2–1.2)

## 2014-12-05 LAB — LIPID PANEL
Cholesterol: 206 mg/dL — ABNORMAL HIGH (ref 0–200)
HDL: 31.4 mg/dL — ABNORMAL LOW (ref >39.00)
NonHDL: 174.6
Total CHOL/HDL Ratio: 7
Triglycerides: 225 mg/dL — ABNORMAL HIGH (ref 0.0–149.0)
VLDL: 45 mg/dL — AB (ref 0.0–40.0)

## 2014-12-05 LAB — LDL CHOLESTEROL, DIRECT: Direct LDL: 127 mg/dL

## 2014-12-05 LAB — POCT INR: INR: 3

## 2014-12-05 LAB — TSH: TSH: 1.65 u[IU]/mL (ref 0.35–4.50)

## 2014-12-05 NOTE — Patient Instructions (Addendum)
It was great seeing you again! I will follow up with you regarding your blood work. You can follow up with me in six months. Please let me know if there is anything I can do for you.   Enjoy the rest of your summer.

## 2014-12-05 NOTE — Progress Notes (Addendum)
Subjective:    Patient ID: Mary Arellano, female    DOB: 24-Jan-1927, 81 y.o.   MRN: [id number]  HPI  Ms. Hitt is here for her complete physical. Her  has a past medical history of AF (atrial fibrillation); Hyperlipidemia; Myxomatous mitral valve; Other primary cardiomyopathies; Long term (current) use of anticoagulants; and CHF NYHA class III. She takes Digoxin for her A-fib.   Reviewed all healthy maintenance protocols for patients age and race.   She exercises every morning and is very active outside of her retirement facility  She eats healthy.   She goes to the eye doctor and dentist yearly.   Vaccinations were updated with this visit. She needs Pneumovax next year.   She has no complaints at this time.    Review of Systems  Constitutional: Negative.   HENT: Positive for hearing loss.   Eyes: Negative.   Respiratory: Negative.   Cardiovascular: Negative.   Gastrointestinal: Negative.   Endocrine: Negative.   Genitourinary: Negative.   Musculoskeletal: Negative.   Skin: Negative.   Neurological: Negative.   Hematological: Negative.   Psychiatric/Behavioral: Negative.   All other systems reviewed and are negative.  Past Medical History  Diagnosis Date  . AF (atrial fibrillation)   . Hyperlipidemia   . Myxomatous mitral valve     post mitral valve replacement 2006  . Other primary cardiomyopathies   . Long term (current) use of anticoagulants   . CHF NYHA class III     History   Social History  . Marital Status: Widowed    Spouse Name: N/A  . Number of Children: N/A  . Years of Education: N/A   Occupational History  . retired   . lives [place]    Social History Main Topics  . Smoking status: Former Smoker    Quit date: 06/01/1951  . Smokeless tobacco: Not on file  . Alcohol Use: No  . Drug Use: No  . Sexual Activity: Not Currently   Other Topics Concern  . Not on file   Social History Narrative    Past Surgical History   Procedure Laterality Date  . S/p icd (guidant vitality icd model #t175), i mplanted 01/23/2007    . Hemorroidectomy    . Tonsillectomy    . Mitral valve replacement 2006 (cow valve)    . Implantable defebrillator      No family history on file.  Allergies  Allergen Reactions  . Simvastatin     REACTION: liver function elevation    Current Outpatient Prescriptions on File Prior to Visit  Medication Sig Dispense Refill  . acetaminophen (TYLENOL) 500 MG tablet Take 2 tablets every 6-8 hours as needed for pain    . amoxicillin (AMOXIL) 500 MG tablet Take 500 mg by mouth. 4 tabs 1 hours prior to dental work     . digoxin (LANOXIN) 0.125 MG tablet Take 125 mcg by mouth every other day.      . furosemide (LASIX) 40 MG tablet Take 40 mg by mouth daily.     Marland Kitchen levothyroxine (SYNTHROID, LEVOTHROID) 25 MCG tablet Take 25 mcg by mouth daily.      . metoprolol (LOPRESSOR) 50 MG tablet Take 50 mg by mouth daily.    . multivitamin (THERAGRAN) per tablet Take 1 tablet by mouth daily.      . nitrofurantoin (MACRODANTIN) 50 MG capsule Take 50 mg by mouth daily.    . Nutritional Supplements (ENSURE HIGH PROTEIN PO) Take by mouth daily.     Marland Kitchen  OMEGA-3 KRILL OIL 300 MG CAPS Take 2 capsules by mouth 2 (two) times daily.     . sodium chloride (OCEAN) 0.65 % nasal spray 1 spray by Nasal route as needed.      . warfarin (COUMADIN) 2.5 MG tablet Take 2.5 mg by mouth daily.    Marland Kitchen warfarin (COUMADIN) 5 MG tablet Take as directed by the coumadin clinic     No current facility-administered medications on file prior to visit.    BP 132/68 mmHg  Temp(Src) 97.5 F (36.4 C) (Oral)  Wt 128 lb 14.4 oz (58.469 kg)       Objective:   Physical Exam  Constitutional: She is oriented to person, place, and time. She appears well-developed and well-nourished. No distress.  HENT:  Head: Normocephalic and atraumatic.  Right Ear: External ear normal.  Left Ear: External ear normal.  Nose: Nose normal.    Mouth/Throat: Oropharynx is clear and moist. No oropharyngeal exudate.  Cerumen impaction in left year  Eyes: Conjunctivae and EOM are normal. Pupils are equal, round, and reactive to light. Left eye exhibits no discharge.  Wearing eye glasses  Neck: Normal range of motion. Neck supple. No tracheal deviation present. No thyromegaly present.  Cardiovascular: Normal rate, normal heart sounds and intact distal pulses.  Exam reveals no gallop and no friction rub.   No murmur heard. A-fib  No carotid bruit  Pulmonary/Chest: Effort normal and breath sounds normal. No respiratory distress. She has no wheezes. She has no rales. She exhibits no tenderness.  Abdominal: Soft. Bowel sounds are normal. She exhibits no distension and no mass. There is no tenderness. There is no rebound and no guarding.  Musculoskeletal: Normal range of motion. She exhibits no edema or tenderness.  Uses walker  Lymphadenopathy:    She has no cervical adenopathy.  Neurological: She is alert and oriented to person, place, and time. She has normal reflexes.  Skin: Skin is warm and dry. No rash noted. She is not diaphoretic. No erythema. No pallor.  Psychiatric: She has a normal mood and affect. Her behavior is normal. Judgment and thought content normal.  Vitals reviewed.      Assessment & Plan:  1. Routine general medical examination at a health care facility - Exam benign - Follow up in six months - Follow up as needed  2. Atrial fibrillation, unspecified - Controlled on current medication. Followed by cardiology - Basic metabolic panel - CBC with Differential/Platelet - Digoxin level  3. Need for Tdap vaccination  - Tdap vaccine greater than or equal to 7yo IM  4. Need for prophylactic vaccination against Streptococcus pneumoniae (pneumococcus)  - Pneumococcal conjugate vaccine 13-valent  5. Hyperlipidemia  - Basic metabolic panel - CBC with Differential/Platelet - Hepatic function panel - TSH -  POCT urinalysis dipstick - Lipid panel; Future   6. Postmenopause  - DG Bone Density; Future

## 2014-12-05 NOTE — Progress Notes (Signed)
Pre visit review using our clinic review tool, if applicable. No additional management support is needed unless otherwise documented below in the visit note. 

## 2014-12-05 NOTE — Addendum Note (Signed)
Addended by: Nancy Fetter on: 12/05/2014 02:27 PM   Modules accepted: Level of Service

## 2015-01-02 ENCOUNTER — Ambulatory Visit (INDEPENDENT_AMBULATORY_CARE_PROVIDER_SITE_OTHER): Payer: Medicare PPO | Admitting: General Practice

## 2015-01-02 DIAGNOSIS — Z5181 Encounter for therapeutic drug level monitoring: Secondary | ICD-10-CM

## 2015-01-02 DIAGNOSIS — I4891 Unspecified atrial fibrillation: Secondary | ICD-10-CM | POA: Diagnosis not present

## 2015-01-02 LAB — POCT INR: INR: 2.5

## 2015-01-02 NOTE — Progress Notes (Signed)
Pre visit review using our clinic review tool, if applicable. No additional management support is needed unless otherwise documented below in the visit note. 

## 2015-01-03 ENCOUNTER — Encounter: Payer: Self-pay | Admitting: Cardiovascular Disease

## 2015-01-30 ENCOUNTER — Ambulatory Visit (INDEPENDENT_AMBULATORY_CARE_PROVIDER_SITE_OTHER): Payer: Medicare PPO | Admitting: General Practice

## 2015-01-30 DIAGNOSIS — Z5181 Encounter for therapeutic drug level monitoring: Secondary | ICD-10-CM

## 2015-01-30 DIAGNOSIS — I4891 Unspecified atrial fibrillation: Secondary | ICD-10-CM | POA: Diagnosis not present

## 2015-01-30 LAB — POCT INR: INR: 2.1

## 2015-01-30 NOTE — Progress Notes (Signed)
Pre visit review using our clinic review tool, if applicable. No additional management support is needed unless otherwise documented below in the visit note. 

## 2015-02-27 ENCOUNTER — Ambulatory Visit: Payer: Medicare PPO

## 2015-03-06 ENCOUNTER — Ambulatory Visit: Payer: Medicare PPO

## 2015-03-11 ENCOUNTER — Ambulatory Visit (INDEPENDENT_AMBULATORY_CARE_PROVIDER_SITE_OTHER): Payer: Medicare PPO | Admitting: General Practice

## 2015-03-11 DIAGNOSIS — Z5181 Encounter for therapeutic drug level monitoring: Secondary | ICD-10-CM | POA: Diagnosis not present

## 2015-03-11 DIAGNOSIS — I4891 Unspecified atrial fibrillation: Secondary | ICD-10-CM

## 2015-03-11 LAB — POCT INR: INR: 3.1

## 2015-03-11 NOTE — Progress Notes (Signed)
Pre visit review using our clinic review tool, if applicable. No additional management support is needed unless otherwise documented below in the visit note. 

## 2015-03-11 NOTE — Progress Notes (Signed)
I have reviewed and agree with the plan. 

## 2015-04-10 ENCOUNTER — Ambulatory Visit (INDEPENDENT_AMBULATORY_CARE_PROVIDER_SITE_OTHER): Payer: Medicare PPO | Admitting: General Practice

## 2015-04-10 DIAGNOSIS — Z5181 Encounter for therapeutic drug level monitoring: Secondary | ICD-10-CM | POA: Diagnosis not present

## 2015-04-10 DIAGNOSIS — I4891 Unspecified atrial fibrillation: Secondary | ICD-10-CM | POA: Diagnosis not present

## 2015-04-10 LAB — POCT INR: INR: 2.6

## 2015-04-10 NOTE — Progress Notes (Signed)
Pre visit review using our clinic review tool, if applicable. No additional management support is needed unless otherwise documented below in the visit note. 

## 2015-04-10 NOTE — Progress Notes (Signed)
I agree with this plan.

## 2015-05-08 ENCOUNTER — Ambulatory Visit: Payer: Medicare PPO

## 2015-05-12 ENCOUNTER — Ambulatory Visit (INDEPENDENT_AMBULATORY_CARE_PROVIDER_SITE_OTHER): Payer: Medicare PPO | Admitting: General Practice

## 2015-05-12 DIAGNOSIS — I4891 Unspecified atrial fibrillation: Secondary | ICD-10-CM | POA: Diagnosis not present

## 2015-05-12 LAB — POCT INR: INR: 2.2

## 2015-05-12 NOTE — Progress Notes (Signed)
I agree with this plan.

## 2015-05-12 NOTE — Progress Notes (Signed)
Pre visit review using our clinic review tool, if applicable. No additional management support is needed unless otherwise documented below in the visit note. 

## 2015-06-09 ENCOUNTER — Ambulatory Visit: Payer: Medicare PPO

## 2015-06-09 ENCOUNTER — Ambulatory Visit (INDEPENDENT_AMBULATORY_CARE_PROVIDER_SITE_OTHER): Payer: Medicare PPO | Admitting: General Practice

## 2015-06-09 DIAGNOSIS — Z5181 Encounter for therapeutic drug level monitoring: Secondary | ICD-10-CM | POA: Diagnosis not present

## 2015-06-09 DIAGNOSIS — I4891 Unspecified atrial fibrillation: Secondary | ICD-10-CM

## 2015-06-09 LAB — POCT INR: INR: 1.8

## 2015-06-09 NOTE — Progress Notes (Signed)
Pre visit review using our clinic review tool, if applicable. No additional management support is needed unless otherwise documented below in the visit note. 

## 2015-06-12 ENCOUNTER — Ambulatory Visit (INDEPENDENT_AMBULATORY_CARE_PROVIDER_SITE_OTHER): Payer: Medicare PPO | Admitting: Cardiovascular Disease

## 2015-06-12 ENCOUNTER — Encounter: Payer: Self-pay | Admitting: Cardiovascular Disease

## 2015-06-12 VITALS — BP 118/70 | HR 66 | Ht 62.0 in | Wt 127.8 lb

## 2015-06-12 DIAGNOSIS — I5022 Chronic systolic (congestive) heart failure: Secondary | ICD-10-CM | POA: Diagnosis not present

## 2015-06-12 DIAGNOSIS — I4891 Unspecified atrial fibrillation: Secondary | ICD-10-CM

## 2015-06-12 NOTE — Patient Instructions (Signed)
Medication Instructions:  Your physician recommends that you continue on your current medications as directed. Please refer to the Current Medication list given to you today.  Labwork: Your physician recommends that you have lab work today: BMP and Digoxin  Testing/Procedures: No new orders.   Follow-Up: Your physician wants you to follow-up in: 1 YEAR with Dr Excell Seltzer.  You will receive a reminder letter in the mail two months in advance. If you don't receive a letter, please call our office to schedule the follow-up appointment.   Any Other Special Instructions Will Be Listed Below (If Applicable).     If you need a refill on your cardiac medications before your next appointment, please call your pharmacy.

## 2015-06-12 NOTE — Progress Notes (Signed)
Cardiology Office Note Date:  06/12/2015   ID:  Kateria Choquette Codrington, DOB 23-Sep-1926, MRN [id number]  PCP:  [name], NP  Cardiologist:  Tonny Bollman, MD    Chief Complaint  Patient presents with  . Follow-up    Atrial Fibrillation  Patient denies chest pain, shortness of breath, and le edema   History of Present Illness: Mary Arellano is a 82 y.o. female who presents for follow-up evaluation. The patient has a history of nonischemic cardiomyopathy, mitral regurgitation status post mitral valve replacement with a bioprosthetic valve, and ICD implantation for primary prevention. At the time of last EP evaluation, her ICD was at [facility] and the decision was made to NOT pursue generator change considering her clinical stability and advanced age.   From a cardiac perspective she is doing okay. She denies chest pain, shortness of breath, lightheadedness, or heart palpitations. She recently underwent left cataract surgery and had no problems with this. She tolerates chronic oral anticoagulation with warfarin and denies any bleeding problems. She is beginning to have some difficulty with memory.   Past Medical History  Diagnosis Date  . AF (atrial fibrillation) (HCC)   . Hyperlipidemia   . Myxomatous mitral valve     post mitral valve replacement 2006  . Other primary cardiomyopathies   . Long term (current) use of anticoagulants   . CHF NYHA class III [facility])     Past Surgical History  Procedure Laterality Date  . S/p icd (guidant vitality icd model #t175), i mplanted 01/23/2007    . Hemorroidectomy    . Tonsillectomy    . Mitral valve replacement 2006 (cow valve)    . Implantable defebrillator      Current Outpatient Prescriptions  Medication Sig Dispense Refill  . acetaminophen (TYLENOL) 500 MG tablet Take 2 tablets every 6-8 hours as needed for pain    . amoxicillin (AMOXIL) 500 MG tablet Take 500 mg by mouth. 4 tabs 1 hours prior to dental work     . digoxin  (LANOXIN) 0.125 MG tablet Take 125 mcg by mouth every other day.      . furosemide (LASIX) 40 MG tablet Take 40 mg by mouth daily.     Marland Kitchen levothyroxine (SYNTHROID, LEVOTHROID) 25 MCG tablet Take 25 mcg by mouth daily.      . metoprolol (LOPRESSOR) 50 MG tablet Take 50 mg by mouth daily.    . multivitamin (THERAGRAN) per tablet Take 1 tablet by mouth daily.      . nitrofurantoin (MACRODANTIN) 50 MG capsule Take 50 mg by mouth daily.    . Nutritional Supplements (ENSURE HIGH PROTEIN PO) Take by mouth daily.     . OMEGA-3 KRILL OIL 300 MG CAPS Take 2 capsules by mouth 2 (two) times daily.     . sodium chloride (OCEAN) 0.65 % nasal spray Place 1 spray into the nose as needed for congestion.     Marland Kitchen warfarin (COUMADIN) 2.5 MG tablet Take 2.5 mg by mouth daily.     No current facility-administered medications for this visit.    Allergies:   Simvastatin   Social History:  The patient  reports that she quit smoking about 64 years ago. She does not have any smokeless tobacco history on file. She reports that she does not drink alcohol or use illicit drugs.   Family History:  The patient's  family history is not on file.    ROS:  Please see the history of present illness.  Otherwise, review  of systems is positive for visual disturbance - recent cataract surgery.  All other systems are reviewed and negative.    PHYSICAL EXAM: VS:  BP 118/70 mmHg  Pulse 66  Ht  (1.575 m)  Wt 127 lb 12.8 oz (57.97 kg)  BMI 23.37 kg/m2 , BMI Body mass index is 23.37 kg/(m^2). GEN: Well nourished, well developed, in no acute distress HEENT: normal Neck: no JVD, no masses. No carotid bruits Cardiac: irregularly irregular without murmur or gallop          Respiratory:  clear to auscultation bilaterally, normal work of breathing GI: soft, nontender, nondistended, + BS MS: no deformity or atrophy Ext: no pretibial edema, pedal pulses 2+= bilaterally Skin: warm and dry, no rash Neuro:  Strength and sensation are  intact Psych: euthymic mood, full affect  EKG:  EKG is ordered today. The ekg ordered today shows atrial fibrillation 67 bpm, ST and T-wave abnormality probably digitalis effect  Recent Labs: 12/05/2014: ALT 27; BUN 22; Creatinine, Ser 1.10; Hemoglobin 13.5; Platelets 180.0; Potassium 3.7; Sodium 140; TSH 1.65   Lipid Panel     Component Value Date/Time   CHOL 206* 12/05/2014 1412   TRIG 225.0* 12/05/2014 1412   HDL 31.40* 12/05/2014 1412   CHOLHDL 7 12/05/2014 1412   VLDL 45.0* 12/05/2014 1412   LDLCALC 79 07/13/2006 1150   LDLDIRECT 127.0 12/05/2014 1412      Wt Readings from Last 3 Encounters:  06/12/15 127 lb 12.8 oz (57.97 kg)  12/05/14 128 lb 14.4 oz (58.469 kg)  11/07/14 129 lb 1.6 oz (58.559 kg)    ASSESSMENT AND PLAN: 1.  Chronic atrial fibrillation: Managed with rate control and anticoagulation. Will check a digoxin level today. Overall she appears stable.  2. Mitral valve disorder status post mitral valve replacement: Continued clinical stability with NYHA 1 symptoms.  3. Chronic systolic heart failure, NYHA 1 symptoms. Medical program reviewed and will be continued without change.  4. HTN: BP well-controlled on current Rx.  5. S/P ICD - device no longer functioning. She has declined removal or generator change Current medicines are reviewed with the patient today.  The patient does not have concerns regarding medicines.  Labs/ tests ordered today include:   Orders Placed This Encounter  Procedures  . Basic Metabolic Panel (BMET)  . Digoxin level  . EKG 12-Lead   Disposition:   FU one year  Signed, Tonny Bollman, MD  06/12/2015 5:11 PM    [facility] [address] [place], [place], [place]  [address] Phone: [phone]; Fax: [phone]

## 2015-06-13 LAB — BASIC METABOLIC PANEL
BUN: 21 mg/dL (ref 7–25)
CO2: 26 mmol/L (ref 20–31)
CREATININE: 0.92 mg/dL — AB (ref 0.60–0.88)
Calcium: 9.4 mg/dL (ref 8.6–10.4)
Chloride: 102 mmol/L (ref 98–110)
GLUCOSE: 115 mg/dL — AB (ref 65–99)
Potassium: 3.9 mmol/L (ref 3.5–5.3)
Sodium: 139 mmol/L (ref 135–146)

## 2015-06-13 LAB — DIGOXIN LEVEL: DIGOXIN LVL: 0.5 ug/L — AB (ref 0.8–2.0)

## 2015-07-07 ENCOUNTER — Ambulatory Visit (INDEPENDENT_AMBULATORY_CARE_PROVIDER_SITE_OTHER): Payer: Medicare PPO | Admitting: General Practice

## 2015-07-07 DIAGNOSIS — Z5181 Encounter for therapeutic drug level monitoring: Secondary | ICD-10-CM

## 2015-07-07 DIAGNOSIS — I4891 Unspecified atrial fibrillation: Secondary | ICD-10-CM

## 2015-07-07 LAB — POCT INR: INR: 2.2

## 2015-07-07 NOTE — Progress Notes (Signed)
I agree with this plan.

## 2015-07-07 NOTE — Progress Notes (Signed)
Pre visit review using our clinic review tool, if applicable. No additional management support is needed unless otherwise documented below in the visit note. 

## 2015-08-04 ENCOUNTER — Ambulatory Visit (INDEPENDENT_AMBULATORY_CARE_PROVIDER_SITE_OTHER): Payer: Medicare PPO | Admitting: General Practice

## 2015-08-04 DIAGNOSIS — Z5181 Encounter for therapeutic drug level monitoring: Secondary | ICD-10-CM

## 2015-08-04 LAB — POCT INR: INR: 2.5

## 2015-08-04 NOTE — Progress Notes (Signed)
Pre visit review using our clinic review tool, if applicable. No additional management support is needed unless otherwise documented below in the visit note. 

## 2015-08-04 NOTE — Progress Notes (Signed)
I agree with this plan.

## 2015-09-01 ENCOUNTER — Ambulatory Visit: Payer: Medicare PPO

## 2015-09-03 ENCOUNTER — Ambulatory Visit (INDEPENDENT_AMBULATORY_CARE_PROVIDER_SITE_OTHER): Payer: Medicare PPO | Admitting: General Practice

## 2015-09-03 DIAGNOSIS — I4891 Unspecified atrial fibrillation: Secondary | ICD-10-CM | POA: Diagnosis not present

## 2015-09-03 DIAGNOSIS — Z5181 Encounter for therapeutic drug level monitoring: Secondary | ICD-10-CM

## 2015-09-03 LAB — POCT INR: INR: 2.4

## 2015-09-03 NOTE — Progress Notes (Signed)
I agree with this plan.

## 2015-09-03 NOTE — Progress Notes (Signed)
Pre visit review using our clinic review tool, if applicable. No additional management support is needed unless otherwise documented below in the visit note. 

## 2015-10-01 ENCOUNTER — Ambulatory Visit (INDEPENDENT_AMBULATORY_CARE_PROVIDER_SITE_OTHER): Payer: Medicare PPO | Admitting: General Practice

## 2015-10-01 DIAGNOSIS — Z5181 Encounter for therapeutic drug level monitoring: Secondary | ICD-10-CM

## 2015-10-01 DIAGNOSIS — I4891 Unspecified atrial fibrillation: Secondary | ICD-10-CM

## 2015-10-01 LAB — POCT INR: INR: 2.9

## 2015-10-01 NOTE — Progress Notes (Signed)
I agree with this plan.

## 2015-10-01 NOTE — Progress Notes (Signed)
Pre visit review using our clinic review tool, if applicable. No additional management support is needed unless otherwise documented below in the visit note. 

## 2015-10-29 ENCOUNTER — Ambulatory Visit: Payer: Medicare PPO

## 2015-11-05 ENCOUNTER — Ambulatory Visit (INDEPENDENT_AMBULATORY_CARE_PROVIDER_SITE_OTHER): Payer: Medicare PPO | Admitting: General Practice

## 2015-11-05 DIAGNOSIS — Z5181 Encounter for therapeutic drug level monitoring: Secondary | ICD-10-CM | POA: Diagnosis not present

## 2015-11-05 DIAGNOSIS — I4891 Unspecified atrial fibrillation: Secondary | ICD-10-CM | POA: Diagnosis not present

## 2015-11-05 LAB — POCT INR: INR: 3.4

## 2015-11-05 NOTE — Progress Notes (Signed)
Pre visit review using our clinic review tool, if applicable. No additional management support is needed unless otherwise documented below in the visit note. 

## 2015-11-05 NOTE — Progress Notes (Signed)
I agree with this plan.

## 2015-12-03 ENCOUNTER — Ambulatory Visit (INDEPENDENT_AMBULATORY_CARE_PROVIDER_SITE_OTHER): Payer: Medicare PPO | Admitting: General Practice

## 2015-12-03 DIAGNOSIS — I4891 Unspecified atrial fibrillation: Secondary | ICD-10-CM

## 2015-12-03 DIAGNOSIS — Z5181 Encounter for therapeutic drug level monitoring: Secondary | ICD-10-CM

## 2015-12-03 LAB — POCT INR: INR: 2.5

## 2015-12-03 NOTE — Progress Notes (Signed)
Pre visit review using our clinic review tool, if applicable. No additional management support is needed unless otherwise documented below in the visit note. 

## 2015-12-03 NOTE — Progress Notes (Signed)
I agree with this plan.

## 2015-12-16 ENCOUNTER — Telehealth: Payer: Self-pay | Admitting: Cardiovascular Disease

## 2015-12-16 NOTE — Telephone Encounter (Signed)
I will forward this message to Dr Excell Seltzer to review and determine if the pt still needs to take Omega 3 krill oil at this time (current dosage 300mg  two capsules two times per day). Last lipid profile in chart from 12/05/2014.  If this form of medication needs to be continued then Dr Excell Seltzer will need to give a specific order for mega red krill oil with directions so that we can sent this to the assisted living facility.

## 2015-12-16 NOTE — Telephone Encounter (Signed)
New message      Pt lives in an assisted living facility.  The in house pharmacy no longer carries omega 3. Daughter will buy mega red krill oil and take it to the facility.  However, in order for the facility to give it to the pt, they will need an order faxed to [phone] attn sholoatta.  If any problems, call daughter

## 2015-12-16 NOTE — Telephone Encounter (Signed)
I spoke with [place] and made her aware. Medication list updated.

## 2015-12-16 NOTE — Telephone Encounter (Signed)
Not really important for her to take this  - I don't think it will add much to her overall health. thx

## 2015-12-29 ENCOUNTER — Ambulatory Visit: Payer: Medicare PPO

## 2015-12-31 ENCOUNTER — Ambulatory Visit (INDEPENDENT_AMBULATORY_CARE_PROVIDER_SITE_OTHER): Payer: Medicare PPO | Admitting: General Practice

## 2015-12-31 DIAGNOSIS — I4891 Unspecified atrial fibrillation: Secondary | ICD-10-CM | POA: Diagnosis not present

## 2015-12-31 DIAGNOSIS — Z5181 Encounter for therapeutic drug level monitoring: Secondary | ICD-10-CM

## 2015-12-31 LAB — POCT INR: INR: 2.5

## 2015-12-31 NOTE — Progress Notes (Signed)
I agree with this plan.

## 2016-02-04 ENCOUNTER — Ambulatory Visit: Payer: Medicare PPO

## 2016-02-09 ENCOUNTER — Ambulatory Visit (INDEPENDENT_AMBULATORY_CARE_PROVIDER_SITE_OTHER): Payer: Medicare PPO | Admitting: General Practice

## 2016-02-09 DIAGNOSIS — I4891 Unspecified atrial fibrillation: Secondary | ICD-10-CM

## 2016-02-09 DIAGNOSIS — Z5181 Encounter for therapeutic drug level monitoring: Secondary | ICD-10-CM | POA: Diagnosis not present

## 2016-02-09 LAB — POCT INR: INR: 2.5

## 2016-02-10 NOTE — Progress Notes (Signed)
I agree with this plan.

## 2016-03-08 ENCOUNTER — Ambulatory Visit (INDEPENDENT_AMBULATORY_CARE_PROVIDER_SITE_OTHER): Payer: Medicare PPO | Admitting: General Practice

## 2016-03-08 DIAGNOSIS — I4891 Unspecified atrial fibrillation: Secondary | ICD-10-CM | POA: Diagnosis not present

## 2016-03-08 DIAGNOSIS — Z5181 Encounter for therapeutic drug level monitoring: Secondary | ICD-10-CM | POA: Diagnosis not present

## 2016-03-08 LAB — POCT INR: INR: 2.1

## 2016-03-08 NOTE — Progress Notes (Signed)
I agree with this plan.

## 2016-04-05 ENCOUNTER — Ambulatory Visit (INDEPENDENT_AMBULATORY_CARE_PROVIDER_SITE_OTHER): Payer: Medicare PPO | Admitting: General Practice

## 2016-04-05 DIAGNOSIS — Z5181 Encounter for therapeutic drug level monitoring: Secondary | ICD-10-CM | POA: Diagnosis not present

## 2016-04-05 DIAGNOSIS — I4891 Unspecified atrial fibrillation: Secondary | ICD-10-CM | POA: Diagnosis not present

## 2016-04-05 LAB — POCT INR: INR: 2.2

## 2016-04-05 NOTE — Progress Notes (Signed)
I agree with this plan.

## 2016-04-05 NOTE — Patient Instructions (Signed)
Pre visit review using our clinic review tool, if applicable. No additional management support is needed unless otherwise documented below in the visit note. 

## 2016-05-03 ENCOUNTER — Ambulatory Visit: Payer: Medicare PPO

## 2016-05-10 ENCOUNTER — Ambulatory Visit: Payer: Medicare PPO

## 2016-05-12 ENCOUNTER — Ambulatory Visit (INDEPENDENT_AMBULATORY_CARE_PROVIDER_SITE_OTHER): Payer: Medicare PPO | Admitting: General Practice

## 2016-05-12 DIAGNOSIS — Z5181 Encounter for therapeutic drug level monitoring: Secondary | ICD-10-CM

## 2016-05-12 DIAGNOSIS — I4891 Unspecified atrial fibrillation: Secondary | ICD-10-CM

## 2016-05-12 LAB — POCT INR: INR: 2.1

## 2016-05-12 NOTE — Progress Notes (Signed)
I agree with this plan.

## 2016-05-12 NOTE — Patient Instructions (Signed)
Pre visit review using our clinic review tool, if applicable. No additional management support is needed unless otherwise documented below in the visit note. 

## 2016-06-03 ENCOUNTER — Ambulatory Visit (INDEPENDENT_AMBULATORY_CARE_PROVIDER_SITE_OTHER)
Admission: RE | Admit: 2016-06-03 | Discharge: 2016-06-03 | Disposition: A | Discharge status: Home / Self Care | Payer: Medicare PPO | Source: Ambulatory Visit | Attending: Adult Health | Admitting: Adult Health

## 2016-06-03 ENCOUNTER — Ambulatory Visit (INDEPENDENT_AMBULATORY_CARE_PROVIDER_SITE_OTHER): Payer: Medicare PPO | Admitting: Adult Health

## 2016-06-03 VITALS — BP 122/70 | Ht 62.0 in | Wt 131.0 lb

## 2016-06-03 DIAGNOSIS — R059 Cough, unspecified: Secondary | ICD-10-CM

## 2016-06-03 DIAGNOSIS — R05 Cough: Secondary | ICD-10-CM | POA: Diagnosis not present

## 2016-06-03 DIAGNOSIS — R0602 Shortness of breath: Secondary | ICD-10-CM

## 2016-06-03 NOTE — Progress Notes (Signed)
Subjective:    Patient ID: Mary Arellano, female    DOB: 05/23/1927, 83 y.o.   MRN: [id number]  HPI  83 year old female who  has a past medical history of AF (atrial fibrillation) (HCC); CHF NYHA class III (HCC); Hyperlipidemia; Long term (current) use of anticoagulants; Myxomatous mitral valve; and Other primary cardiomyopathies (HCC). She presents with her daughter to this visit. I last saw her about 1.5 years ago.   Per daughter, during the Christmas holiday she noticed that her mom was coughing more and seemed short of breath with exertion and " a little more slow over all."  Garrie endorses having a dry cough from time to time for an unknown duration but does not feel like she is any more short of breath than normal.   She denies any fevers or feeling acutely ill. She is around a lot of sick people at her retirement home.    Review of Systems  Constitutional: Negative.   HENT: Negative.   Respiratory: Positive for cough and shortness of breath.   Cardiovascular: Negative.   Gastrointestinal: Negative.   Endocrine: Negative.   Musculoskeletal: Positive for arthralgias, gait problem and myalgias.  Skin: Negative.   Psychiatric/Behavioral: Negative.   All other systems reviewed and are negative.  Past Medical History:  Diagnosis Date  . AF (atrial fibrillation) (HCC)   . CHF NYHA class III (HCC)   . Hyperlipidemia   . Long term (current) use of anticoagulants   . Myxomatous mitral valve    post mitral valve replacement 2006  . Other primary cardiomyopathies [facility])     Social History   Social History  . Marital status: Widowed    Spouse name: N/A  . Number of children: N/A  . Years of education: N/A   Occupational History  . retired Retired  . lives in [place]    Social History Main Topics  . Smoking status: Former Smoker    Quit date: 06/01/1951  . Smokeless tobacco: Not on file  . Alcohol use No  . Drug use: No  . Sexual activity: Not  Currently   Other Topics Concern  . Not on file   Social History Narrative  . No narrative on file    Past Surgical History:  Procedure Laterality Date  . HEMORROIDECTOMY    . implantable defebrillator    . mitral valve replacement 2006 (cow valve)    . s/p icd (Guidant VItality ICD model [phone]), i mplanted 01/23/2007    . TONSILLECTOMY      No family history on file.  Allergies  Allergen Reactions  . Simvastatin     REACTION: liver function elevation    Current Outpatient Prescriptions on File Prior to Visit  Medication Sig Dispense Refill  . acetaminophen (TYLENOL) 500 MG tablet Take 2 tablets every 6-8 hours as needed for pain    . amoxicillin (AMOXIL) 500 MG tablet Take 500 mg by mouth. 4 tabs 1 hours prior to dental work     . digoxin (LANOXIN) 0.125 MG tablet Take 125 mcg by mouth every other day.      . furosemide (LASIX) 40 MG tablet Take 40 mg by mouth daily.     Marland Kitchen levothyroxine (SYNTHROID, LEVOTHROID) 25 MCG tablet Take 25 mcg by mouth daily.      . metoprolol (LOPRESSOR) 50 MG tablet Take 50 mg by mouth daily.    . multivitamin (THERAGRAN) per tablet Take 1 tablet by mouth daily.      Marland Kitchen  nitrofurantoin (MACRODANTIN) 50 MG capsule Take 50 mg by mouth daily.    . Nutritional Supplements (ENSURE HIGH PROTEIN PO) Take by mouth daily.     . sodium chloride (OCEAN) 0.65 % nasal spray Place 1 spray into the nose as needed for congestion.     Marland Kitchen warfarin (COUMADIN) 2.5 MG tablet Take 2.5 mg by mouth daily.     No current facility-administered medications on file prior to visit.     BP 122/70   Ht 5\' 2"  (1.575 m)   Wt 131 lb (59.4 kg)   BMI 23.96 kg/m       Objective:   Physical Exam  Constitutional: She is oriented to person, place, and time. She appears well-developed and well-nourished. No distress.  Eyes: Conjunctivae and EOM are normal. Pupils are equal, round, and reactive to light. Right eye exhibits no discharge. Left eye exhibits no discharge.  Neck:  Normal range of motion. Neck supple. No thyromegaly present.  Cardiovascular: Normal rate, normal heart sounds and intact distal pulses.  An irregularly irregular rhythm present. Exam reveals no gallop and no friction rub.   No murmur heard. Pulmonary/Chest: Effort normal and breath sounds normal. No respiratory distress. She has no wheezes. She has no rales. She exhibits no tenderness.  Dry cough  Oxygen saturation dropped to 89% with walking but she quickly recovered to 96% with minimal rest.   Musculoskeletal:  Walks with a single prong cane  Lymphadenopathy:    She has no cervical adenopathy.  Neurological: She is alert and oriented to person, place, and time.  Skin: Skin is warm and dry. No rash noted. She is not diaphoretic. No erythema. No pallor.  Psychiatric: She has a normal mood and affect. Her behavior is normal. Judgment and thought content normal.  Nursing note and vitals reviewed.     Assessment & Plan:  1. Cough - possibly viral? She does have a history of a fib and cardiomyopathy- possible attributed to this.  - DG Chest 2 View; Future - Mucinex as needed  2. SOB (shortness of breath) - unknown cause. She denies feeling SOB. Possibly a fib or cardiomyopathy. Will get chest x ray and possibly have her follow up with cardiology  - DG Chest 2 View; Future  [name], NP

## 2016-06-09 ENCOUNTER — Ambulatory Visit: Payer: Medicare PPO

## 2016-06-09 ENCOUNTER — Ambulatory Visit (INDEPENDENT_AMBULATORY_CARE_PROVIDER_SITE_OTHER): Payer: Medicare PPO | Admitting: General Practice

## 2016-06-09 DIAGNOSIS — I4891 Unspecified atrial fibrillation: Secondary | ICD-10-CM | POA: Diagnosis not present

## 2016-06-09 DIAGNOSIS — Z5181 Encounter for therapeutic drug level monitoring: Secondary | ICD-10-CM

## 2016-06-09 LAB — POCT INR: INR: 2

## 2016-06-09 NOTE — Patient Instructions (Signed)
Pre visit review using our clinic review tool, if applicable. No additional management support is needed unless otherwise documented below in the visit note. 

## 2016-06-09 NOTE — Progress Notes (Signed)
I agree with this plan.

## 2016-07-07 ENCOUNTER — Ambulatory Visit (INDEPENDENT_AMBULATORY_CARE_PROVIDER_SITE_OTHER): Payer: Medicare PPO | Admitting: General Practice

## 2016-07-07 DIAGNOSIS — Z5181 Encounter for therapeutic drug level monitoring: Secondary | ICD-10-CM | POA: Diagnosis not present

## 2016-07-07 DIAGNOSIS — I4891 Unspecified atrial fibrillation: Secondary | ICD-10-CM

## 2016-07-07 LAB — POCT INR: INR: 2.2

## 2016-07-07 NOTE — Patient Instructions (Signed)
Pre visit review using our clinic review tool, if applicable. No additional management support is needed unless otherwise documented below in the visit note. 

## 2016-07-07 NOTE — Progress Notes (Signed)
I agree with this plan.

## 2016-07-08 ENCOUNTER — Ambulatory Visit (INDEPENDENT_AMBULATORY_CARE_PROVIDER_SITE_OTHER): Payer: Medicare PPO | Admitting: Cardiovascular Disease

## 2016-07-08 ENCOUNTER — Encounter: Payer: Self-pay | Admitting: Cardiovascular Disease

## 2016-07-08 VITALS — BP 136/64 | HR 70 | Ht 62.0 in | Wt 131.0 lb

## 2016-07-08 DIAGNOSIS — I5022 Chronic systolic (congestive) heart failure: Secondary | ICD-10-CM | POA: Diagnosis not present

## 2016-07-08 DIAGNOSIS — I482 Chronic atrial fibrillation, unspecified: Secondary | ICD-10-CM

## 2016-07-08 NOTE — Patient Instructions (Signed)

## 2016-07-08 NOTE — Progress Notes (Signed)
Cardiology Office Note Date:  07/08/2016   ID:  Mary Arellano, DOB 1926/12/04, MRN [id number]  PCP:  [name], NP  Cardiologist:  Tonny Bollman, MD    Chief Complaint  Patient presents with  . Follow-up    Chronic systolic heart failure     History of Present Illness: Mary Arellano is a 83 y.o. female who presents for  follow-up evaluation. The patient has a history of nonischemic cardiomyopathy, mitral regurgitation status post mitral valve replacement with a bioprosthetic valve, and ICD implantation for primary prevention of sudden cardiac death. She has had EP follow-up with decision to avoid generator change considering her clinical stability and advanced age.   The patient has been doing fairly well. She continues to enjoy living in her assisted living facility. She is here with her daughter, Mary Arellano today. Mary Arellano reports that the patient's memory continues to decline. Otherwise she really has no specific complaints. The patient denies chest pain, shortness of breath, leg swelling, heart palpitations, lightheadedness, or syncope. She had a chest x-ray recently demonstrating cardiomegaly without overt signs of heart failure.   Past Medical History:  Diagnosis Date  . AF (atrial fibrillation) (HCC)   . CHF NYHA class III (HCC)   . Hyperlipidemia   . Long term (current) use of anticoagulants   . Myxomatous mitral valve    post mitral valve replacement 2006  . Other primary cardiomyopathies [facility])     Past Surgical History:  Procedure Laterality Date  . HEMORROIDECTOMY    . implantable defebrillator    . mitral valve replacement 2006 (cow valve)    . s/p icd (Guidant VItality ICD model [id number]), i mplanted 01/23/2007    . TONSILLECTOMY      Current Outpatient Prescriptions  Medication Sig Dispense Refill  . acetaminophen (TYLENOL) 500 MG tablet Take 2 tablets every 6-8 hours as needed for pain    . amoxicillin (AMOXIL) 500 MG tablet Take 500 mg by mouth. 4  tabs 1 hours prior to dental work     . digoxin (LANOXIN) 0.125 MG tablet Take 125 mcg by mouth every other day.      . furosemide (LASIX) 40 MG tablet Take 40 mg by mouth daily.     Marland Kitchen levothyroxine (SYNTHROID, LEVOTHROID) 25 MCG tablet Take 25 mcg by mouth daily.      . metoprolol (LOPRESSOR) 50 MG tablet Take 50 mg by mouth daily.    . multivitamin (THERAGRAN) per tablet Take 1 tablet by mouth daily.      . nitrofurantoin (MACRODANTIN) 50 MG capsule Take 50 mg by mouth daily.    . Nutritional Supplements (ENSURE HIGH PROTEIN PO) Take by mouth daily.     . sodium chloride (OCEAN) 0.65 % nasal spray Place 1 spray into the nose as needed for congestion.     Marland Kitchen warfarin (COUMADIN) 2.5 MG tablet Take 2.5 mg by mouth daily.     No current facility-administered medications for this visit.     Allergies:   Simvastatin   Social History:  The patient  reports that she quit smoking about 65 years ago. She does not have any smokeless tobacco history on file. She reports that she does not drink alcohol or use drugs.   Family History:  The patient's  family history is not on file.    ROS:  Please see the history of present illness. All other systems are reviewed and negative.    PHYSICAL EXAM: VS:  BP 136/64  Pulse 70   Ht 5\' 2"  (1.575 m)   Wt 131 lb (59.4 kg)   BMI 23.96 kg/m  , BMI Body mass index is 23.96 kg/m. GEN: Well nourished, well developed, pleasant elderly woman in no acute distress  HEENT: normal  Neck: no JVD, no masses. No carotid bruits Cardiac: irregularly irregular without murmur or gallop                Respiratory:  clear to auscultation bilaterally, normal work of breathing GI: soft, nontender, nondistended, + BS MS: no deformity or atrophy  Ext: no pretibial edema Skin: warm and dry, no rash Neuro:  Strength and sensation are intact Psych: euthymic mood, full affect  EKG:  EKG is ordered today. The ekg ordered today shows atrial fibrillation with occasional  aberrantly conducted complexes  Recent Labs: No results found for requested labs within last 8760 hours.   Lipid Panel     Component Value Date/Time   CHOL 206 (H) 12/05/2014 1412   TRIG 225.0 (H) 12/05/2014 1412   HDL 31.40 (L) 12/05/2014 1412   CHOLHDL 7 12/05/2014 1412   VLDL 45.0 (H) 12/05/2014 1412   LDLCALC 79 07/13/2006 1150   LDLDIRECT 127.0 12/05/2014 1412      Wt Readings from Last 3 Encounters:  07/08/16 131 lb (59.4 kg)  06/03/16 131 lb (59.4 kg)  06/12/15 127 lb 12.8 oz (58 kg)     ASSESSMENT AND PLAN: 1.  Chronic systolic heart failure, NYHA functional class 1-2: The patient's daughter reports that she has some shortness of breath with walking, but the patient denies this. In any event she seems to be functioning well without any major cardiopulmonary limitation. She will continue on digoxin, furosemide, metoprolol, and warfarin.  2. Permanent atrial fibrillation: Tolerating anticoagulation with warfarin. Denies bleeding problems. EKG is reviewed without any acute changes. Ventricular rate is well controlled.  3. Mitral valve disease status post mitral valve replacement: The patient's exam is normal and suggests normal function of her bioprosthetic heart valve.   Current medicines are reviewed with the patient today.  The patient does not have concerns regarding medicines.  Labs/ tests ordered today include:   Orders Placed This Encounter  Procedures  . EKG 12-Lead    Disposition:   FU one year  Signed, Tonny Bollman, MD  07/08/2016 1:35 PM    [facility] [address] [place], [place], [place]  [address] Phone: [phone]; Fax: [phone]

## 2016-07-19 ENCOUNTER — Telehealth: Payer: Self-pay | Admitting: Cardiovascular Disease

## 2016-07-19 NOTE — Telephone Encounter (Signed)
New message      Pt is in a long term healthcare facility.  She has been there for 67yrs.  Her long term health ins is now denying her claims.  Daughter need an order sent to the facility stating that she needs help getting in and out of bed and she needs help dressing herself.  Please fax order to [facility] [phone], attn Charlotta.  Please call daughter and let her know when this has been faxed or call her if there is a problem.  She has a deadline to get all of this info to the ins company.

## 2016-07-21 ENCOUNTER — Encounter: Payer: Self-pay | Admitting: Cardiovascular Disease

## 2016-07-21 NOTE — Telephone Encounter (Signed)
This encounter was created in error - please disregard.

## 2016-07-21 NOTE — Telephone Encounter (Signed)
Follow up    Per daughter  Checking on the information being sent over to the nursing home, they stated they still have not received it yet.

## 2016-07-21 NOTE — Telephone Encounter (Signed)
Please call daughter and find out what she needs. I will need to have her get her insurance company to fax me a new form

## 2016-07-21 NOTE — Telephone Encounter (Signed)
I left a voicemail for Mary Arellano that these orders should be obtained from [name] NP.  I reviewed document under media tab and [name] NP did complete some paperwork in regards to these issues on 07/07/2016. It looks like on the paperwork [name] documented that the pt can transfer independently and needs stand by assistance for dressing. I will forward this message to [name] NP as an FYI since the pt's daughter Mary Arellano should contact PCP.

## 2016-07-22 ENCOUNTER — Encounter: Payer: Self-pay | Admitting: Adult Health

## 2016-07-22 NOTE — Telephone Encounter (Signed)
I called and spoke with patient's daughter.  She states that the patient's insurance company is denying her claim for assisted living because she "does not meet the criteria." Daughter states that if PCP can just write a letter stating that patient has trouble getting out of bed, dressing herself, and feeding herself - then the insurance company will approve for assisted living facility. Once letter is completed, I can fax to [facility] and they will take care of it from there. Thank you!

## 2016-07-23 NOTE — Telephone Encounter (Signed)
Mary Arellano has wrote up orders for patient, and I have faxed them over to [facility]. Patient's daughter has been notified and verbalized understanding. Thanks!

## 2016-08-04 ENCOUNTER — Ambulatory Visit (INDEPENDENT_AMBULATORY_CARE_PROVIDER_SITE_OTHER): Payer: Medicare PPO | Admitting: General Practice

## 2016-08-04 DIAGNOSIS — I4891 Unspecified atrial fibrillation: Secondary | ICD-10-CM

## 2016-08-04 DIAGNOSIS — Z5181 Encounter for therapeutic drug level monitoring: Secondary | ICD-10-CM | POA: Diagnosis not present

## 2016-08-04 LAB — POCT INR: INR: 2.1

## 2016-08-04 NOTE — Patient Instructions (Signed)
Pre visit review using our clinic review tool, if applicable. No additional management support is needed unless otherwise documented below in the visit note. 

## 2016-08-04 NOTE — Progress Notes (Signed)
I agree with this plan.

## 2016-09-01 ENCOUNTER — Ambulatory Visit (INDEPENDENT_AMBULATORY_CARE_PROVIDER_SITE_OTHER): Payer: Medicare PPO | Admitting: General Practice

## 2016-09-01 DIAGNOSIS — I4891 Unspecified atrial fibrillation: Secondary | ICD-10-CM

## 2016-09-01 DIAGNOSIS — Z5181 Encounter for therapeutic drug level monitoring: Secondary | ICD-10-CM

## 2016-09-01 LAB — POCT INR: INR: 2.4

## 2016-09-01 NOTE — Progress Notes (Signed)
I agree with this plan.

## 2016-09-01 NOTE — Patient Instructions (Signed)
Pre visit review using our clinic review tool, if applicable. No additional management support is needed unless otherwise documented below in the visit note. 

## 2016-09-29 ENCOUNTER — Ambulatory Visit: Payer: Medicare PPO

## 2016-10-06 ENCOUNTER — Ambulatory Visit (INDEPENDENT_AMBULATORY_CARE_PROVIDER_SITE_OTHER): Payer: Medicare PPO | Admitting: General Practice

## 2016-10-06 DIAGNOSIS — Z5181 Encounter for therapeutic drug level monitoring: Secondary | ICD-10-CM | POA: Diagnosis not present

## 2016-10-06 DIAGNOSIS — I4891 Unspecified atrial fibrillation: Secondary | ICD-10-CM

## 2016-10-06 LAB — POCT INR: INR: 2.3

## 2016-10-06 NOTE — Progress Notes (Signed)
I agree with this plan.

## 2016-10-06 NOTE — Patient Instructions (Signed)
Pre visit review using our clinic review tool, if applicable. No additional management support is needed unless otherwise documented below in the visit note. 

## 2016-11-03 ENCOUNTER — Ambulatory Visit (INDEPENDENT_AMBULATORY_CARE_PROVIDER_SITE_OTHER): Payer: Medicare PPO | Admitting: General Practice

## 2016-11-03 DIAGNOSIS — Z5181 Encounter for therapeutic drug level monitoring: Secondary | ICD-10-CM

## 2016-11-03 DIAGNOSIS — I4891 Unspecified atrial fibrillation: Secondary | ICD-10-CM

## 2016-11-03 LAB — POCT INR: INR: 3

## 2016-11-03 NOTE — Progress Notes (Signed)
I agree with this plan.

## 2016-11-03 NOTE — Patient Instructions (Signed)
Pre visit review using our clinic review tool, if applicable. No additional management support is needed unless otherwise documented below in the visit note. 

## 2016-11-12 ENCOUNTER — Other Ambulatory Visit: Payer: Self-pay | Admitting: Family Medicine

## 2016-11-12 MED ORDER — METOPROLOL TARTRATE 50 MG PO TABS: 50.0000 mg | ORAL_TABLET | Freq: Every day | ORAL | 1 refills | Status: AC

## 2016-11-12 NOTE — Telephone Encounter (Signed)
Ok to refill 

## 2016-11-12 NOTE — Telephone Encounter (Signed)
I reviewed patient's chart and this is a historical medication. Would you mind advising on refill?

## 2016-12-07 ENCOUNTER — Other Ambulatory Visit: Payer: Self-pay | Admitting: Family Medicine

## 2016-12-07 NOTE — Telephone Encounter (Signed)
I called the pt and her daughter stated Dr Excell Seltzer (cardiologist) may have prescribed this for the pt and the pharmacy must have sent this to the wrong office.  I advised the pts daughter to contact Dr Excell Seltzer for refills and she agreed.

## 2016-12-07 NOTE — Telephone Encounter (Signed)
Can we find out if she was still taking this? Was it prescribed by someone else

## 2016-12-08 ENCOUNTER — Ambulatory Visit (INDEPENDENT_AMBULATORY_CARE_PROVIDER_SITE_OTHER): Payer: Medicare PPO | Admitting: General Practice

## 2016-12-08 DIAGNOSIS — Z5181 Encounter for therapeutic drug level monitoring: Secondary | ICD-10-CM | POA: Diagnosis not present

## 2016-12-08 DIAGNOSIS — I4891 Unspecified atrial fibrillation: Secondary | ICD-10-CM

## 2016-12-08 LAB — POCT INR: INR: 2.3

## 2016-12-08 NOTE — Patient Instructions (Signed)
Pre visit review using our clinic review tool, if applicable. No additional management support is needed unless otherwise documented below in the visit note. 

## 2016-12-08 NOTE — Progress Notes (Signed)
I agree with this plan.

## 2016-12-22 ENCOUNTER — Telehealth: Payer: Self-pay | Admitting: Family Medicine

## 2016-12-22 NOTE — Telephone Encounter (Signed)
Received a fax from [employer].  Insurance requires prior auth for nitrofurantoin (MACRODANTIN) 50 MG capsule.  Looked in the chart.  Cory did not prescribe this.  Called and questioned pharmacy.  [facility] signed physician orders for [facility] and medication was listed.  Will check with [name] to see if he was aware and if I should proceed with PA.

## 2016-12-22 NOTE — Telephone Encounter (Signed)
Ok to proceed with orders

## 2016-12-29 NOTE — Telephone Encounter (Signed)
Macrodantin approved.  Called and informed [facility].  They will contact the pt's insurnace for other information that is needed.

## 2016-12-30 ENCOUNTER — Encounter: Payer: Self-pay | Admitting: Adult Health

## 2016-12-30 ENCOUNTER — Ambulatory Visit: Payer: Medicare PPO | Admitting: Adult Health

## 2016-12-30 ENCOUNTER — Ambulatory Visit (INDEPENDENT_AMBULATORY_CARE_PROVIDER_SITE_OTHER): Payer: Medicare PPO | Admitting: Adult Health

## 2016-12-30 VITALS — BP 144/66 | Temp 98.1°F | Ht 62.0 in | Wt 132.0 lb

## 2016-12-30 DIAGNOSIS — R4189 Other symptoms and signs involving cognitive functions and awareness: Secondary | ICD-10-CM | POA: Diagnosis not present

## 2016-12-30 NOTE — Progress Notes (Signed)
Subjective:    Patient ID: Mary Arellano, female    DOB: March 17, 1927, 83 y.o.   MRN: [id number]  HPI  83 year old female who presents with her daughter due to multiple issues that [name] has had at her facility. Per the facility via an email to her daughter... [name] has recently started telling cleaning staff that her room did not need to be cleaned when it fact it needed to be. She would also get mad at nursing staff when they would try and bath or change her.   Her daughter reports that she has noticed changes in her cognitive function as she cannot remember places that she went that day and she will forget names.   Family denies seeing any behavior changes   Review of Systems See HPI   Past Medical History:  Diagnosis Date  . AF (atrial fibrillation) (HCC)   . CHF NYHA class III (HCC)   . Hyperlipidemia   . Long term (current) use of anticoagulants   . Myxomatous mitral valve    post mitral valve replacement 2006  . Other primary cardiomyopathies     Social History   Social History  . Marital status: Widowed    Spouse name: N/A  . Number of children: N/A  . Years of education: N/A   Occupational History  . retired Retired  . lives in [place]    Social History Main Topics  . Smoking status: Former Smoker    Quit date: 06/01/1951  . Smokeless tobacco: Never Used  . Alcohol use No  . Drug use: No  . Sexual activity: Not Currently   Other Topics Concern  . Not on file   Social History Narrative  . No narrative on file    Past Surgical History:  Procedure Laterality Date  . HEMORROIDECTOMY    . implantable defebrillator    . mitral valve replacement 2006 (cow valve)    . s/p icd (Guidant VItality ICD model [phone]), i mplanted 01/23/2007    . TONSILLECTOMY      No family history on file.  Allergies  Allergen Reactions  . Simvastatin     REACTION: liver function elevation    Current Outpatient Prescriptions on File Prior to Visit    Medication Sig Dispense Refill  . acetaminophen (TYLENOL) 500 MG tablet Take 2 tablets every 6-8 hours as needed for pain    . amoxicillin (AMOXIL) 500 MG tablet Take 500 mg by mouth. 4 tabs 1 hours prior to dental work     . digoxin (LANOXIN) 0.125 MG tablet Take 125 mcg by mouth every other day.      . furosemide (LASIX) 40 MG tablet Take 40 mg by mouth daily.     Marland Kitchen levothyroxine (SYNTHROID, LEVOTHROID) 25 MCG tablet Take 25 mcg by mouth daily.      . metoprolol tartrate (LOPRESSOR) 50 MG tablet Take 1 tablet (50 mg total) by mouth daily. 90 tablet 1  . nitrofurantoin (MACRODANTIN) 50 MG capsule Take 50 mg by mouth daily.    . Nutritional Supplements (ENSURE HIGH PROTEIN PO) Take by mouth daily.     . sodium chloride (OCEAN) 0.65 % nasal spray Place 1 spray into the nose as needed for congestion.     Marland Kitchen warfarin (COUMADIN) 2.5 MG tablet Take 2.5 mg by mouth daily.     No current facility-administered medications on file prior to visit.     BP (!) 144/66 (BP Location: Left Arm)   Temp  98.1 F (36.7 C) (Oral)   Ht 5\' 2"  (1.575 m)   Wt 132 lb (59.9 kg)   BMI 24.14 kg/m       Objective:   Physical Exam  Constitutional: She appears well-developed and well-nourished. No distress.  HENT:  Head: Normocephalic and atraumatic.  Right Ear: External ear normal.  Left Ear: External ear normal.  Nose: Nose normal.  Mouth/Throat: Oropharynx is clear and moist. No oropharyngeal exudate.  Eyes: Pupils are equal, round, and reactive to light. Conjunctivae are normal. Right eye exhibits no discharge. Left eye exhibits no discharge.  Cardiovascular: Normal rate, regular rhythm, normal heart sounds and intact distal pulses.  Exam reveals no gallop and no friction rub.   No murmur heard. Pulmonary/Chest: Effort normal and breath sounds normal. No respiratory distress. She has no wheezes. She has no rales. She exhibits no tenderness.  Musculoskeletal:  Has steady gait with rolling walker   Neurological: She is alert.  She could not tell me where she went out to eat today for lunch but could tell me what she had   Skin: Skin is warm and dry. No rash noted. She is not diaphoretic. No erythema. No pallor.  Dry flaking skin on face   Psychiatric: She has a normal mood and affect. Her behavior is normal. Judgment and thought content normal.  Nursing note and vitals reviewed.     Assessment & Plan:  She does not seem any more cognitively impaired than she did in the past. Will get CBC, BMP, TSH, and Vit B12 levels. Can consider trial of Aricpet in the future.. Will follow up with daughter once labs are back and decide on a plan   [name], NP

## 2016-12-30 NOTE — Patient Instructions (Addendum)
It was great seeing you today   I will call Eileen Stanford with the results of the labs and then we will go from there.   Please let me know if you need anything

## 2016-12-31 ENCOUNTER — Telehealth: Payer: Self-pay | Admitting: Adult Health

## 2016-12-31 ENCOUNTER — Other Ambulatory Visit (INDEPENDENT_AMBULATORY_CARE_PROVIDER_SITE_OTHER): Payer: Medicare PPO | Admitting: Adult Health

## 2016-12-31 DIAGNOSIS — R4189 Other symptoms and signs involving cognitive functions and awareness: Secondary | ICD-10-CM

## 2016-12-31 LAB — BASIC METABOLIC PANEL
BUN: 19 mg/dL (ref 6–23)
CO2: 29 meq/L (ref 19–32)
Calcium: 9.1 mg/dL (ref 8.4–10.5)
Chloride: 102 mEq/L (ref 96–112)
Creatinine, Ser: 1.11 mg/dL (ref 0.40–1.20)
GFR: 49.28 mL/min — ABNORMAL LOW (ref >60.00)
GLUCOSE: 106 mg/dL — AB (ref 70–99)
POTASSIUM: 4.1 meq/L (ref 3.5–5.1)
Sodium: 139 mEq/L (ref 135–145)

## 2016-12-31 LAB — POC URINALSYSI DIPSTICK (AUTOMATED)
Bilirubin, UA: NEGATIVE
Glucose, UA: NEGATIVE
Ketones, UA: NEGATIVE
Leukocytes, UA: NEGATIVE
Nitrite, UA: NEGATIVE
PH UA: 6 (ref 5.0–8.0)
PROTEIN UA: NEGATIVE
RBC UA: NEGATIVE
SPEC GRAV UA: 1.025 (ref 1.010–1.025)
UROBILINOGEN UA: 0.2 U/dL

## 2016-12-31 LAB — CBC WITH DIFFERENTIAL/PLATELET
BASOS ABS: 0.1 10*3/uL (ref 0.0–0.1)
Basophils Relative: 1.3 % (ref 0.0–3.0)
EOS ABS: 0.3 10*3/uL (ref 0.0–0.7)
Eosinophils Relative: 3.8 % (ref 0.0–5.0)
HEMATOCRIT: 41.6 % (ref 36.0–46.0)
Hemoglobin: 14.1 g/dL (ref 12.0–15.0)
LYMPHS PCT: 26.7 % (ref 12.0–46.0)
Lymphs Abs: 2 10*3/uL (ref 0.7–4.0)
MCHC: 34 g/dL (ref 30.0–36.0)
MCV: 92.2 fl (ref 78.0–100.0)
MONOS PCT: 9.7 % (ref 3.0–12.0)
Monocytes Absolute: 0.7 10*3/uL (ref 0.1–1.0)
Neutro Abs: 4.4 10*3/uL (ref 1.4–7.7)
Neutrophils Relative %: 58.5 % (ref 43.0–77.0)
PLATELETS: 191 10*3/uL (ref 150.0–400.0)
RBC: 4.51 Mil/uL (ref 3.87–5.11)
RDW: 14 % (ref 11.5–15.5)
WBC: 7.5 10*3/uL (ref 4.0–10.5)

## 2016-12-31 LAB — VITAMIN B12: Vitamin B-12: 347 pg/mL (ref 211–911)

## 2016-12-31 LAB — TSH: TSH: 1.87 u[IU]/mL (ref 0.35–4.50)

## 2016-12-31 NOTE — Telephone Encounter (Signed)
Left message with Eileen Stanford ( daughter) to call back regarding labs

## 2016-12-31 NOTE — Telephone Encounter (Signed)
Spoke to [place] and informed her of lab results.

## 2017-01-18 NOTE — Patient Instructions (Signed)
Pre visit review using our clinic review tool, if applicable. No additional management support is needed unless otherwise documented below in the visit note. 

## 2017-01-19 ENCOUNTER — Ambulatory Visit (INDEPENDENT_AMBULATORY_CARE_PROVIDER_SITE_OTHER): Payer: Medicare PPO | Admitting: General Practice

## 2017-01-19 DIAGNOSIS — Z5181 Encounter for therapeutic drug level monitoring: Secondary | ICD-10-CM

## 2017-01-21 NOTE — Patient Instructions (Signed)
Pre visit review using our clinic review tool, if applicable. No additional management support is needed unless otherwise documented below in the visit note. 

## 2017-01-24 ENCOUNTER — Ambulatory Visit (INDEPENDENT_AMBULATORY_CARE_PROVIDER_SITE_OTHER): Payer: Medicare PPO | Admitting: General Practice

## 2017-01-24 DIAGNOSIS — I4891 Unspecified atrial fibrillation: Secondary | ICD-10-CM | POA: Diagnosis not present

## 2017-01-24 DIAGNOSIS — Z5181 Encounter for therapeutic drug level monitoring: Secondary | ICD-10-CM | POA: Diagnosis not present

## 2017-01-24 LAB — POCT INR: INR: 2.4

## 2017-01-24 NOTE — Progress Notes (Signed)
I agree with this plan.

## 2017-03-07 ENCOUNTER — Ambulatory Visit (INDEPENDENT_AMBULATORY_CARE_PROVIDER_SITE_OTHER): Payer: Medicare PPO | Admitting: General Practice

## 2017-03-07 DIAGNOSIS — Z7901 Long term (current) use of anticoagulants: Secondary | ICD-10-CM

## 2017-03-07 DIAGNOSIS — Z23 Encounter for immunization: Secondary | ICD-10-CM

## 2017-03-07 DIAGNOSIS — Z5181 Encounter for therapeutic drug level monitoring: Secondary | ICD-10-CM

## 2017-03-07 LAB — POCT INR: INR: 2.4

## 2017-03-07 NOTE — Progress Notes (Signed)
I have reviewed and agree with this plan  

## 2017-03-07 NOTE — Patient Instructions (Signed)
Pre visit review using our clinic review tool, if applicable. No additional management support is needed unless otherwise documented below in the visit note. 

## 2017-04-18 ENCOUNTER — Ambulatory Visit (INDEPENDENT_AMBULATORY_CARE_PROVIDER_SITE_OTHER): Payer: Medicare PPO | Admitting: General Practice

## 2017-04-18 DIAGNOSIS — I4891 Unspecified atrial fibrillation: Secondary | ICD-10-CM

## 2017-04-18 DIAGNOSIS — Z7901 Long term (current) use of anticoagulants: Secondary | ICD-10-CM

## 2017-04-18 LAB — POCT INR: INR: 2.6

## 2017-04-18 NOTE — Patient Instructions (Addendum)
Pre visit review using our clinic review tool, if applicable. No additional management support is needed unless otherwise documented below in the visit note.  Continue to take 2.5 mg all days except take 5 mg on M/W/F. Re-check 6 weeks.

## 2017-04-18 NOTE — Progress Notes (Signed)
I agree with this plan.

## 2017-04-20 ENCOUNTER — Ambulatory Visit: Payer: Medicare PPO | Admitting: Adult Health

## 2017-04-20 ENCOUNTER — Encounter: Payer: Self-pay | Admitting: Adult Health

## 2017-04-20 VITALS — BP 124/66 | HR 85 | Temp 97.3°F | Wt 123.0 lb

## 2017-04-20 DIAGNOSIS — R05 Cough: Secondary | ICD-10-CM | POA: Diagnosis not present

## 2017-04-20 DIAGNOSIS — R059 Cough, unspecified: Secondary | ICD-10-CM

## 2017-04-20 MED ORDER — BENZONATATE 100 MG PO CAPS: 100.0000 mg | ORAL_CAPSULE | Freq: Two times a day (BID) | ORAL | 1 refills | Status: AC | PRN

## 2017-04-20 NOTE — Progress Notes (Signed)
Subjective:    Patient ID: Mary Arellano, female    DOB: March 27, 1927, 83 y.o.   MRN: [id number]  HPI  83 year old female who  has a past medical history of AF (atrial fibrillation) (HCC), CHF NYHA class III (HCC), Hyperlipidemia, Long term (current) use of anticoagulants, Myxomatous mitral valve, and Other primary cardiomyopathies. She presents with her daughter to this visit. Her complaint is that of cough. She reports a " dry cough" for the last 2-3 weeks. She denies any sputum production, fever, sinus pain and pressure, or feeling ill.   Family, who is with her has not noticed a cough    Review of Systems  See HPI   Past Medical History:  Diagnosis Date  . AF (atrial fibrillation) (HCC)   . CHF NYHA class III (HCC)   . Hyperlipidemia   . Long term (current) use of anticoagulants   . Myxomatous mitral valve    post mitral valve replacement 2006  . Other primary cardiomyopathies     Social History   Socioeconomic History  . Marital status: Widowed    Spouse name: Not on file  . Number of children: Not on file  . Years of education: Not on file  . Highest education level: Not on file  Social Needs  . Financial resource strain: Not on file  . Food insecurity - worry: Not on file  . Food insecurity - inability: Not on file  . Transportation needs - medical: Not on file  . Transportation needs - non-medical: Not on file  Occupational History  . Occupation: retired    Associate Professormployer: RETIRED  . Occupation: lives in [place]  Tobacco Use  . Smoking status: Former Smoker    Last attempt to quit: 06/01/1951    Years since quitting: 65.9  . Smokeless tobacco: Never Used  Substance and Sexual Activity  . Alcohol use: No  . Drug use: No  . Sexual activity: Not Currently  Other Topics Concern  . Not on file  Social History Narrative  . Not on file    Past Surgical History:  Procedure Laterality Date  . HEMORROIDECTOMY    . implantable defebrillator    .  mitral valve replacement 2006 (cow valve)    . s/p icd (Guidant VItality ICD model [phone]), i mplanted 01/23/2007    . TONSILLECTOMY      History reviewed. No pertinent family history.  Allergies  Allergen Reactions  . Simvastatin     REACTION: liver function elevation    Current Outpatient Medications on File Prior to Visit  Medication Sig Dispense Refill  . acetaminophen (TYLENOL) 500 MG tablet Take 2 tablets every 6-8 hours as needed for pain    . amoxicillin (AMOXIL) 500 MG tablet Take 500 mg by mouth. 4 tabs 1 hours prior to dental work     . digoxin (LANOXIN) 0.125 MG tablet Take 125 mcg by mouth every other day.      . furosemide (LASIX) 40 MG tablet Take 40 mg by mouth daily.     Marland Kitchen. levothyroxine (SYNTHROID, LEVOTHROID) 25 MCG tablet Take 25 mcg by mouth daily.      . metoprolol tartrate (LOPRESSOR) 50 MG tablet Take 1 tablet (50 mg total) by mouth daily. 90 tablet 1  . Multiple Vitamins-Minerals (CERTAVITE/ANTIOXIDANTS PO) Take by mouth.    . nitrofurantoin (MACRODANTIN) 50 MG capsule Take 50 mg by mouth daily.    . Nutritional Supplements (ENSURE HIGH PROTEIN PO) Take by mouth daily.     .Marland Kitchen  sodium chloride (OCEAN) 0.65 % nasal spray Place 1 spray into the nose as needed for congestion.     Marland Kitchen warfarin (COUMADIN) 2.5 MG tablet Take 2.5 mg by mouth daily.    Marland Kitchen warfarin (COUMADIN) 5 MG tablet Take 5 mg by mouth daily.     No current facility-administered medications on file prior to visit.     BP 124/66 (BP Location: Left Arm)   Pulse 85   Temp (!) 97.3 F (36.3 C) (Oral)   Wt 123 lb (55.8 kg)   SpO2 96%   BMI 22.50 kg/m        Objective:   Physical Exam  Constitutional: She is oriented to person, place, and time. She appears well-developed and well-nourished. No distress.  Cardiovascular: Normal rate, regular rhythm, normal heart sounds and intact distal pulses. Exam reveals no gallop and no friction rub.  No murmur heard. Pulmonary/Chest: Effort normal and breath  sounds normal. No respiratory distress. She has no wheezes. She has no rales. She exhibits no tenderness.  Dry cough in the office   Neurological: She is alert and oriented to person, place, and time. She has normal reflexes. She displays normal reflexes. No cranial nerve deficit. She exhibits normal muscle tone. Coordination normal.  Skin: Skin is warm and dry. No rash noted. She is not diaphoretic. No erythema. No pallor.  Nursing note and vitals reviewed.     Assessment & Plan:  1. Cough - Will treat with Tessalon pearls. No signs of sinusitis, pneumonia, bronchitis.  - benzonatate (TESSALON) 100 MG capsule; Take 1 capsule (100 mg total) by mouth 2 (two) times daily as needed for cough.  Dispense: 20 capsule; Refill: 1   [name], NP

## 2017-05-25 ENCOUNTER — Telehealth: Payer: Self-pay | Admitting: Family Medicine

## 2017-05-25 NOTE — Telephone Encounter (Signed)
Received and gave to [facility] to sign.

## 2017-05-25 NOTE — Telephone Encounter (Signed)
Copied from CRM [id number]. Topic: Inquiry >> May 25, 2017 10:48 AM Eston Mould B wrote: Reason for CRM: Gaynelle Adu from [facility]  [phone]       Asking if Fax for coumadin was received from 12/22

## 2017-05-25 NOTE — Telephone Encounter (Signed)
Form faxed and received confirmation that the transaction was successful.

## 2017-05-26 ENCOUNTER — Ambulatory Visit: Payer: Medicare PPO | Admitting: Adult Health

## 2017-05-26 ENCOUNTER — Encounter: Payer: Self-pay | Admitting: Adult Health

## 2017-05-26 ENCOUNTER — Telehealth: Payer: Self-pay | Admitting: Adult Health

## 2017-05-26 VITALS — BP 124/74 | Temp 97.4°F | Ht 62.0 in | Wt 117.6 lb

## 2017-05-26 DIAGNOSIS — N3090 Cystitis, unspecified without hematuria: Secondary | ICD-10-CM | POA: Diagnosis not present

## 2017-05-26 LAB — CBC WITH DIFFERENTIAL/PLATELET
Basophils Absolute: 0.1 10*3/uL (ref 0.0–0.1)
Basophils Relative: 0.8 % (ref 0.0–3.0)
EOS ABS: 0 10*3/uL (ref 0.0–0.7)
EOS PCT: 0.7 % (ref 0.0–5.0)
HEMATOCRIT: 43.2 % (ref 36.0–46.0)
HEMOGLOBIN: 14.6 g/dL (ref 12.0–15.0)
LYMPHS PCT: 15.2 % (ref 12.0–46.0)
Lymphs Abs: 1.1 10*3/uL (ref 0.7–4.0)
MCHC: 33.7 g/dL (ref 30.0–36.0)
MCV: 90.5 fl (ref 78.0–100.0)
Monocytes Absolute: 0.8 10*3/uL (ref 0.1–1.0)
Monocytes Relative: 10.4 % (ref 3.0–12.0)
Neutro Abs: 5.3 10*3/uL (ref 1.4–7.7)
Neutrophils Relative %: 72.9 % (ref 43.0–77.0)
Platelets: 183 10*3/uL (ref 150.0–400.0)
RBC: 4.78 Mil/uL (ref 3.87–5.11)
RDW: 14.5 % (ref 11.5–15.5)
WBC: 7.2 10*3/uL (ref 4.0–10.5)

## 2017-05-26 LAB — POCT URINALYSIS DIPSTICK
Bilirubin, UA: NEGATIVE
GLUCOSE UA: NEGATIVE
Ketones, UA: 5
Nitrite, UA: NEGATIVE
Protein, UA: 2
RBC UA: 3
Spec Grav, UA: 1.015 (ref 1.010–1.025)
Urobilinogen, UA: 0.2 E.U./dL
pH, UA: 7 (ref 5.0–8.0)

## 2017-05-26 LAB — BASIC METABOLIC PANEL
BUN: 30 mg/dL — ABNORMAL HIGH (ref 6–23)
CHLORIDE: 96 meq/L (ref 96–112)
CO2: 33 meq/L — AB (ref 19–32)
Calcium: 9.1 mg/dL (ref 8.4–10.5)
Creatinine, Ser: 1.21 mg/dL — ABNORMAL HIGH (ref 0.40–1.20)
GFR: 44.57 mL/min — ABNORMAL LOW (ref >60.00)
GLUCOSE: 106 mg/dL — AB (ref 70–99)
POTASSIUM: 3.3 meq/L — AB (ref 3.5–5.1)
Sodium: 139 mEq/L (ref 135–145)

## 2017-05-26 MED ORDER — SULFAMETHOXAZOLE-TRIMETHOPRIM 800-160 MG PO TABS: 1.0000 | ORAL_TABLET | Freq: Two times a day (BID) | ORAL | 0 refills | Status: AC

## 2017-05-26 NOTE — Telephone Encounter (Signed)
Updated daughter on recent labs. Her K level was slightly low at 3.3. I am going to have [place] ( daughter ) supplement with foods high in potassium for the next few days.   Will follow up up with family once urine culture is back

## 2017-05-26 NOTE — Progress Notes (Signed)
GBT:DVVOHYWV, [name], NP Chief Complaint  Patient presents with  . Urinary Tract Infection    sx started a month ago Confusion Pt denies pain Dizziness Loss of appetite     Current Issues:  Presents with unknown amount of days of dysuria and urinary frequency Associated symptoms include:  cloudy urine, dysuria, lower abdominal pain, nausea and urinary frequency, confusion   There is a history  of similar symptoms. Sexually active:  No   No concern for STI.  Prior to Admission medications   Medication Sig Start Date End Date Taking? Authorizing Provider  acetaminophen (TYLENOL) 500 MG tablet Take 2 tablets every 6-8 hours as needed for pain 04/28/11  Yes Stacie Glaze, MD  benzonatate (TESSALON) 100 MG capsule Take 1 capsule (100 mg total) by mouth 2 (two) times daily as needed for cough. 04/20/17  Yes Savir Blanke, [name], NP  digoxin (LANOXIN) 0.125 MG tablet Take 125 mcg by mouth every other day.     Yes [provider]  furosemide (LASIX) 40 MG tablet Take 40 mg by mouth daily.    Yes [provider]  levothyroxine (SYNTHROID, LEVOTHROID) 25 MCG tablet Take 25 mcg by mouth daily.     Yes [provider]  metoprolol tartrate (LOPRESSOR) 50 MG tablet Take 1 tablet (50 mg total) by mouth daily. 11/12/16  Yes Torrence Branagan, [name], NP  Multiple Vitamins-Minerals (CERTAVITE/ANTIOXIDANTS PO) Take by mouth.   Yes [provider]  nitrofurantoin (MACRODANTIN) 50 MG capsule Take 50 mg by mouth daily.   Yes [provider]  Nutritional Supplements (ENSURE HIGH PROTEIN PO) Take by mouth daily.    Yes [provider]  sodium chloride (OCEAN) 0.65 % nasal spray Place 1 spray into the nose as needed for congestion.    Yes [provider]  warfarin (COUMADIN) 2.5 MG tablet Take 2.5 mg by mouth daily.   Yes [provider]  warfarin (COUMADIN) 5 MG tablet Take 5 mg by mouth daily.   Yes [provider]  amoxicillin (AMOXIL) 500 MG  tablet Take 500 mg by mouth. 4 tabs 1 hours prior to dental work     [provider]    Review of Systems:See HPI   PE:  BP 124/74   Temp (!) 97.4 F (36.3 C) (Oral)   Ht 5\' 2"  (1.575 m)   Wt 117 lb 9.6 oz (53.3 kg)   BMI 21.51 kg/m  Constitutional: Alert and oriented in no acute distress  Heart: Normal rate and normal rhythm  Lungs: Clear to auscultation  Back: No cva tenderness Abdomen: Left flank pain with palpation   Results for orders placed or performed in visit on 04/18/17  POCT INR  Result Value Ref Range   INR 2.6     Assessment and Plan:  1. Cystitis  - CBC with Differential - Basic metabolic panel - Urine culture - sulfamethoxazole-trimethoprim (BACTRIM DS,SEPTRA DS) 800-160 MG tablet; Take 1 tablet by mouth 2 (two) times daily for 7 days.  Dispense: 14 tablet; Refill: 0 - POC Urinalysis Dipstick + leuks, protein, blood - Drink more fluids  - Follow up if no improvement in the next 2-3 days   [name], NP

## 2017-05-29 LAB — URINE CULTURE
MICRO NUMBER:: 81452423
SPECIMEN QUALITY:: ADEQUATE

## 2017-06-02 ENCOUNTER — Other Ambulatory Visit: Payer: Self-pay | Admitting: Family Medicine

## 2017-06-02 DIAGNOSIS — R319 Hematuria, unspecified: Principal | ICD-10-CM

## 2017-06-02 DIAGNOSIS — N390 Urinary tract infection, site not specified: Secondary | ICD-10-CM

## 2017-06-06 ENCOUNTER — Ambulatory Visit: Payer: Medicare PPO | Admitting: General Practice

## 2017-06-06 ENCOUNTER — Other Ambulatory Visit: Payer: Medicare PPO

## 2017-06-06 DIAGNOSIS — I4891 Unspecified atrial fibrillation: Secondary | ICD-10-CM

## 2017-06-06 DIAGNOSIS — N10 Acute tubulo-interstitial nephritis: Secondary | ICD-10-CM | POA: Diagnosis not present

## 2017-06-06 DIAGNOSIS — Z7901 Long term (current) use of anticoagulants: Secondary | ICD-10-CM

## 2017-06-06 DIAGNOSIS — R350 Frequency of micturition: Secondary | ICD-10-CM

## 2017-06-06 LAB — POCT INR: INR: 3.7

## 2017-06-06 NOTE — Patient Instructions (Addendum)
Pre visit review using our clinic review tool, if applicable. No additional management support is needed unless otherwise documented below in the visit note.  Hold coumadin today (1/7) and then change dosage and take 2.5 mg all days except take 5 mg on M/F.   Re-check 4 weeks.

## 2017-06-07 LAB — POC URINALSYSI DIPSTICK (AUTOMATED)
Bilirubin, UA: NEGATIVE
Blood, UA: NEGATIVE
GLUCOSE UA: NEGATIVE
KETONES UA: NEGATIVE
Leukocytes, UA: NEGATIVE
Nitrite, UA: NEGATIVE
Protein, UA: NEGATIVE
SPEC GRAV UA: 1.015 (ref 1.010–1.025)
UROBILINOGEN UA: 0.2 U/dL
pH, UA: 7 (ref 5.0–8.0)

## 2017-06-07 NOTE — Addendum Note (Signed)
Addended by: Bonnye Fava on: 06/07/2017 03:09 PM   Modules accepted: Orders

## 2017-07-04 ENCOUNTER — Ambulatory Visit: Payer: Medicare PPO | Admitting: General Practice

## 2017-07-04 DIAGNOSIS — Z7901 Long term (current) use of anticoagulants: Secondary | ICD-10-CM | POA: Diagnosis not present

## 2017-07-04 DIAGNOSIS — I4891 Unspecified atrial fibrillation: Secondary | ICD-10-CM | POA: Diagnosis not present

## 2017-07-04 LAB — POCT INR: INR: 2

## 2017-07-04 NOTE — Patient Instructions (Addendum)
Pre visit review using our clinic review tool, if applicable. No additional management support is needed unless otherwise documented below in the visit note.  Continue to take 2.5 mg all days except take 5 mg on M/F.   Re-check 4 weeks.

## 2017-07-04 NOTE — Progress Notes (Signed)
I have reviewed and agree with this plan  

## 2017-07-18 ENCOUNTER — Telehealth: Payer: Self-pay | Admitting: Cardiovascular Disease

## 2017-07-18 NOTE — Telephone Encounter (Signed)
New Message   Pt c/o swelling: STAT is pt has developed SOB within 24 hours  1) How much weight have you gained and in what time span? n/a  2) If swelling, where is the swelling located? feet  3) Are you currently taking a fluid pill? yes  4) Are you currently SOB? no  5) Do you have a log of your daily weights (if so, list)? yes  6) Have you gained 3 pounds in a day or 5 pounds in a week? n/a  7) Have you traveled recently? No

## 2017-07-18 NOTE — Telephone Encounter (Signed)
Per daughter, assisted living facility called and said that patient is having swelling in feet. No c/o weight gain, chest pain, dizziness or sob. Daughter unable to say whether the swelling is early morning or later in the day. Patient does not wear compression stockings but does have them available. Daughter taught to elevate lower extremities, limit sodium in diet and wear compression stockings. Daughter advised that patient could be seen at the first available appointment with an extender. Daughter stated that she was offered 08/09/17 with an APP but declined. Daughter said that she would contact patient's PCP for an evaluation. Daughter was given the first available appointment with Dr. Excell Seltzer and advised that after visit with PCP, if PCP felt patient needed to be seen sooner than 08/09/17, to have them contact our office for an appointment. Daughter advised that if symptoms get worse, or patient develops, pain in legs, chest pain, sob, dizziness or weight gain, to have patient evaluated in the ED. Daughter verbalized understanding of plan.

## 2017-08-01 ENCOUNTER — Ambulatory Visit (INDEPENDENT_AMBULATORY_CARE_PROVIDER_SITE_OTHER): Payer: Medicare PPO | Admitting: General Practice

## 2017-08-01 DIAGNOSIS — Z7901 Long term (current) use of anticoagulants: Secondary | ICD-10-CM

## 2017-08-01 DIAGNOSIS — I4891 Unspecified atrial fibrillation: Secondary | ICD-10-CM | POA: Diagnosis not present

## 2017-08-01 LAB — POCT INR: INR: 2.8

## 2017-08-01 NOTE — Progress Notes (Signed)
I have reviewed and agree with this plan  

## 2017-08-01 NOTE — Patient Instructions (Addendum)
Pre visit review using our clinic review tool, if applicable. No additional management support is needed unless otherwise documented below in the visit note.  Continue to take 2.5 mg all days except take 5 mg on M/F.   Re-check 4 weeks.  New orders sent with patient back to [facility].

## 2017-08-08 ENCOUNTER — Emergency Department (HOSPITAL_COMMUNITY): Payer: Medicare PPO

## 2017-08-08 ENCOUNTER — Other Ambulatory Visit: Payer: Self-pay

## 2017-08-08 ENCOUNTER — Inpatient Hospital Stay (HOSPITAL_COMMUNITY)
Admission: EM | Admit: 2017-08-08 | Discharge: 2017-08-12 | DRG: 292 | Disposition: A | Discharge status: Skilled Nursing Facility | Payer: Medicare PPO | Attending: Internal Medicine | Admitting: Internal Medicine

## 2017-08-08 ENCOUNTER — Telehealth: Payer: Self-pay | Admitting: Cardiovascular Disease

## 2017-08-08 DIAGNOSIS — Z79899 Other long term (current) drug therapy: Secondary | ICD-10-CM | POA: Diagnosis not present

## 2017-08-08 DIAGNOSIS — Z66 Do not resuscitate: Secondary | ICD-10-CM | POA: Diagnosis present

## 2017-08-08 DIAGNOSIS — E876 Hypokalemia: Secondary | ICD-10-CM | POA: Diagnosis present

## 2017-08-08 DIAGNOSIS — Z888 Allergy status to other drugs, medicaments and biological substances status: Secondary | ICD-10-CM

## 2017-08-08 DIAGNOSIS — Z953 Presence of xenogenic heart valve: Secondary | ICD-10-CM | POA: Diagnosis not present

## 2017-08-08 DIAGNOSIS — Z792 Long term (current) use of antibiotics: Secondary | ICD-10-CM | POA: Diagnosis not present

## 2017-08-08 DIAGNOSIS — Z87891 Personal history of nicotine dependence: Secondary | ICD-10-CM | POA: Diagnosis not present

## 2017-08-08 DIAGNOSIS — Z95810 Presence of automatic (implantable) cardiac defibrillator: Secondary | ICD-10-CM | POA: Diagnosis not present

## 2017-08-08 DIAGNOSIS — E875 Hyperkalemia: Secondary | ICD-10-CM | POA: Diagnosis present

## 2017-08-08 DIAGNOSIS — I428 Other cardiomyopathies: Secondary | ICD-10-CM | POA: Diagnosis present

## 2017-08-08 DIAGNOSIS — I361 Nonrheumatic tricuspid (valve) insufficiency: Secondary | ICD-10-CM | POA: Diagnosis not present

## 2017-08-08 DIAGNOSIS — I482 Chronic atrial fibrillation: Secondary | ICD-10-CM | POA: Diagnosis present

## 2017-08-08 DIAGNOSIS — Z7901 Long term (current) use of anticoagulants: Secondary | ICD-10-CM

## 2017-08-08 DIAGNOSIS — I050 Rheumatic mitral stenosis: Secondary | ICD-10-CM | POA: Diagnosis present

## 2017-08-08 DIAGNOSIS — I248 Other forms of acute ischemic heart disease: Secondary | ICD-10-CM | POA: Diagnosis present

## 2017-08-08 DIAGNOSIS — F0150 Vascular dementia without behavioral disturbance: Secondary | ICD-10-CM | POA: Diagnosis not present

## 2017-08-08 DIAGNOSIS — W19XXXA Unspecified fall, initial encounter: Secondary | ICD-10-CM | POA: Diagnosis not present

## 2017-08-08 DIAGNOSIS — W010XXA Fall on same level from slipping, tripping and stumbling without subsequent striking against object, initial encounter: Secondary | ICD-10-CM | POA: Diagnosis present

## 2017-08-08 DIAGNOSIS — R627 Adult failure to thrive: Secondary | ICD-10-CM | POA: Diagnosis present

## 2017-08-08 DIAGNOSIS — Y92129 Unspecified place in nursing home as the place of occurrence of the external cause: Secondary | ICD-10-CM

## 2017-08-08 DIAGNOSIS — I493 Ventricular premature depolarization: Secondary | ICD-10-CM | POA: Diagnosis present

## 2017-08-08 DIAGNOSIS — I5021 Acute systolic (congestive) heart failure: Secondary | ICD-10-CM | POA: Insufficient documentation

## 2017-08-08 DIAGNOSIS — J9811 Atelectasis: Secondary | ICD-10-CM | POA: Diagnosis present

## 2017-08-08 DIAGNOSIS — R402413 Glasgow coma scale score 13-15, at hospital admission: Secondary | ICD-10-CM | POA: Diagnosis present

## 2017-08-08 DIAGNOSIS — I5023 Acute on chronic systolic (congestive) heart failure: Principal | ICD-10-CM

## 2017-08-08 DIAGNOSIS — Z7189 Other specified counseling: Secondary | ICD-10-CM

## 2017-08-08 DIAGNOSIS — I5041 Acute combined systolic (congestive) and diastolic (congestive) heart failure: Secondary | ICD-10-CM | POA: Diagnosis not present

## 2017-08-08 DIAGNOSIS — Z515 Encounter for palliative care: Secondary | ICD-10-CM

## 2017-08-08 DIAGNOSIS — F0390 Unspecified dementia without behavioral disturbance: Secondary | ICD-10-CM

## 2017-08-08 LAB — CBC WITH DIFFERENTIAL/PLATELET
BASOS PCT: 0 %
Basophils Absolute: 0 10*3/uL (ref 0.0–0.1)
EOS ABS: 0.1 10*3/uL (ref 0.0–0.7)
EOS PCT: 1 %
HCT: 45.1 % (ref 36.0–46.0)
Hemoglobin: 15.2 g/dL — ABNORMAL HIGH (ref 12.0–15.0)
LYMPHS ABS: 1 10*3/uL (ref 0.7–4.0)
Lymphocytes Relative: 10 %
MCH: 30.9 pg (ref 26.0–34.0)
MCHC: 33.7 g/dL (ref 30.0–36.0)
MCV: 91.7 fL (ref 78.0–100.0)
MONOS PCT: 8 %
Monocytes Absolute: 0.7 10*3/uL (ref 0.1–1.0)
Neutro Abs: 7.5 10*3/uL (ref 1.7–7.7)
Neutrophils Relative %: 81 %
PLATELETS: 157 10*3/uL (ref 150–400)
RBC: 4.92 MIL/uL (ref 3.87–5.11)
RDW: 14.2 % (ref 11.5–15.5)
WBC: 9.2 10*3/uL (ref 4.0–10.5)

## 2017-08-08 LAB — TROPONIN I: TROPONIN I: 0.15 ng/mL — AB (ref <0.03)

## 2017-08-08 LAB — URINALYSIS, ROUTINE W REFLEX MICROSCOPIC
BILIRUBIN URINE: NEGATIVE
Glucose, UA: NEGATIVE mg/dL
Hgb urine dipstick: NEGATIVE
KETONES UR: 5 mg/dL — AB
LEUKOCYTES UA: NEGATIVE
NITRITE: NEGATIVE
PH: 7 (ref 5.0–8.0)
Protein, ur: NEGATIVE mg/dL
Specific Gravity, Urine: 1.01 (ref 1.005–1.030)

## 2017-08-08 LAB — COMPREHENSIVE METABOLIC PANEL
ALT: 31 U/L (ref 14–54)
ANION GAP: 11 (ref 5–15)
AST: 36 U/L (ref 15–41)
Albumin: 3.5 g/dL (ref 3.5–5.0)
Alkaline Phosphatase: 65 U/L (ref 38–126)
BILIRUBIN TOTAL: 1.7 mg/dL — AB (ref 0.3–1.2)
BUN: 17 mg/dL (ref 6–20)
CHLORIDE: 99 mmol/L — AB (ref 101–111)
CO2: 31 mmol/L (ref 22–32)
Calcium: 9.4 mg/dL (ref 8.9–10.3)
Creatinine, Ser: 0.96 mg/dL (ref 0.44–1.00)
GFR calc Af Amer: 59 mL/min — ABNORMAL LOW (ref >60)
GFR, EST NON AFRICAN AMERICAN: 51 mL/min — AB (ref >60)
Glucose, Bld: 118 mg/dL — ABNORMAL HIGH (ref 65–99)
POTASSIUM: 3.5 mmol/L (ref 3.5–5.1)
Sodium: 141 mmol/L (ref 135–145)
TOTAL PROTEIN: 6.8 g/dL (ref 6.5–8.1)

## 2017-08-08 LAB — TSH: TSH: 1.879 u[IU]/mL (ref 0.350–4.500)

## 2017-08-08 LAB — PROTIME-INR
INR: 2.37
PROTHROMBIN TIME: 25.7 s — AB (ref 11.4–15.2)

## 2017-08-08 LAB — MRSA PCR SCREENING: MRSA by PCR: POSITIVE — AB

## 2017-08-08 LAB — DIGOXIN LEVEL
DIGOXIN LVL: 0.4 ng/mL — AB (ref 0.8–2.0)
DIGOXIN LVL: 0.8 ng/mL (ref 0.8–2.0)

## 2017-08-08 LAB — CK: CK TOTAL: 54 U/L (ref 38–234)

## 2017-08-08 LAB — BRAIN NATRIURETIC PEPTIDE: B NATRIURETIC PEPTIDE 5: 242.6 pg/mL — AB (ref 0.0–100.0)

## 2017-08-08 LAB — CBG MONITORING, ED: GLUCOSE-CAPILLARY: 113 mg/dL — AB (ref 65–99)

## 2017-08-08 MED ORDER — CHLORHEXIDINE GLUCONATE CLOTH 2 % EX PADS
6.0000 | MEDICATED_PAD | Freq: Every day | CUTANEOUS | Status: DC
  Administered 2017-08-09 – 2017-08-12 (×4): 6 via TOPICAL

## 2017-08-08 MED ORDER — LEVOTHYROXINE SODIUM 25 MCG PO TABS
25.0000 ug | ORAL_TABLET | Freq: Every day | ORAL | Status: DC
  Administered 2017-08-09 – 2017-08-12 (×4): 25 ug via ORAL
  Filled 2017-08-08 (×4): qty 1

## 2017-08-08 MED ORDER — METOPROLOL TARTRATE 50 MG PO TABS
50.0000 mg | ORAL_TABLET | Freq: Two times a day (BID) | ORAL | Status: DC
  Administered 2017-08-08 – 2017-08-12 (×8): 50 mg via ORAL
  Filled 2017-08-08 (×8): qty 1

## 2017-08-08 MED ORDER — ACETAMINOPHEN 500 MG PO TABS: 1000.0000 mg | ORAL_TABLET | Freq: Four times a day (QID) | ORAL | Status: DC | PRN

## 2017-08-08 MED ORDER — WARFARIN - PHARMACIST DOSING INPATIENT: Freq: Every day | Status: DC

## 2017-08-08 MED ORDER — FUROSEMIDE 10 MG/ML IJ SOLN
80.0000 mg | Freq: Once | INTRAMUSCULAR | Status: AC
  Administered 2017-08-08: 80 mg via INTRAVENOUS
  Filled 2017-08-08: qty 8

## 2017-08-08 MED ORDER — MUPIROCIN 2 % EX OINT
1.0000 "application " | TOPICAL_OINTMENT | Freq: Two times a day (BID) | CUTANEOUS | Status: DC
  Administered 2017-08-08 – 2017-08-12 (×8): 1 via NASAL
  Filled 2017-08-08: qty 22

## 2017-08-08 MED ORDER — DIGOXIN 125 MCG PO TABS
0.1250 mg | ORAL_TABLET | ORAL | Status: DC
  Administered 2017-08-08 – 2017-08-12 (×3): 0.125 mg via ORAL
  Filled 2017-08-08 (×3): qty 1

## 2017-08-08 MED ORDER — ONDANSETRON HCL 4 MG/2ML IJ SOLN: 4.0000 mg | Freq: Four times a day (QID) | INTRAMUSCULAR | Status: DC | PRN

## 2017-08-08 MED ORDER — WARFARIN SODIUM 5 MG PO TABS
5.0000 mg | ORAL_TABLET | Freq: Once | ORAL | Status: AC
Start: 2017-08-08 — End: 2017-08-08
  Administered 2017-08-08: 5 mg via ORAL
  Filled 2017-08-08: qty 1

## 2017-08-08 MED ORDER — SALINE SPRAY 0.65 % NA SOLN
1.0000 | NASAL | Status: DC | PRN
  Administered 2017-08-12: 1 via NASAL
  Filled 2017-08-08: qty 44

## 2017-08-08 MED ORDER — NITROFURANTOIN MACROCRYSTAL 50 MG PO CAPS
50.0000 mg | ORAL_CAPSULE | Freq: Every day | ORAL | Status: DC
  Administered 2017-08-08 – 2017-08-12 (×5): 50 mg via ORAL
  Filled 2017-08-08 (×5): qty 1

## 2017-08-08 MED ORDER — DIGOXIN 0.0625 MG HALF TABLET
0.0625 mg | ORAL_TABLET | Freq: Once | ORAL | Status: AC
  Administered 2017-08-08: 0.0625 mg via ORAL
  Filled 2017-08-08: qty 1

## 2017-08-08 MED ORDER — FUROSEMIDE 10 MG/ML IJ SOLN
40.0000 mg | Freq: Two times a day (BID) | INTRAMUSCULAR | Status: AC
  Administered 2017-08-08 – 2017-08-10 (×5): 40 mg via INTRAVENOUS
  Filled 2017-08-08 (×5): qty 4

## 2017-08-08 MED ORDER — METOPROLOL TARTRATE 50 MG PO TABS: 50.0000 mg | ORAL_TABLET | Freq: Every day | ORAL | Status: DC

## 2017-08-08 MED ORDER — ONDANSETRON HCL 4 MG PO TABS: 4.0000 mg | ORAL_TABLET | Freq: Four times a day (QID) | ORAL | Status: DC | PRN

## 2017-08-08 MED ORDER — ZOLPIDEM TARTRATE 5 MG PO TABS: 5.0000 mg | ORAL_TABLET | Freq: Every evening | ORAL | Status: DC | PRN

## 2017-08-08 NOTE — ED Triage Notes (Addendum)
Transported by [facility] from [facility]--unwitnessed fall, staff reports patient was lying in the floor for at least 5 hours. Pt alert and oriented x 2, per baseline; reports left flank pain and right knee pain. Has some bruising above left eye. Pt on blood thinners for A-fib.

## 2017-08-08 NOTE — Telephone Encounter (Signed)
New Message:   Pt fell this morning but daughter believes they are going to admit her. Pt daughter wanted to let you all know that they saw fluid around her heart. Just wanted to keep her doctor in the loop of things.

## 2017-08-08 NOTE — Progress Notes (Signed)
PT arrived to unit. Manual BP 140/100 P-113 90%RA. Placed on 2lnc >97%. C/o dizziness.MD updated.

## 2017-08-08 NOTE — Plan of Care (Signed)
  Progressing Education: Knowledge of General Education information will improve 08/08/2017 2251 - Progressing by Cristela Felt, RN Elimination: Will not experience complications related to urinary retention 08/08/2017 2251 - Progressing by Cristela Felt, RN Pain Managment: General experience of comfort will improve 08/08/2017 2251 - Progressing by Cristela Felt, RN Safety: Ability to remain free from injury will improve 08/08/2017 2251 - Progressing by Cristela Felt, RN

## 2017-08-08 NOTE — ED Notes (Signed)
Peri-wick applied for urine collection.

## 2017-08-08 NOTE — H&P (Signed)
[facility]                                                                                    Patient Demographics  Mary Arellano, is a 84 y.o. female  CSN: [id number]  MRN: [id number]  DOB - 07-18-1926  Admit Date - 08/08/2017  Outpatient Primary MD for the patient is [name], NP   With History of -  Past Medical History:  Diagnosis Date  . AF (atrial fibrillation) (HCC)   . CHF NYHA class III (HCC)   . Hyperlipidemia   . Long term (current) use of anticoagulants   . Myxomatous mitral valve    post mitral valve replacement 2006  . Other primary cardiomyopathies       Past Surgical History:  Procedure Laterality Date  . HEMORROIDECTOMY    . implantable defebrillator    . mitral valve replacement 2006 (cow valve)    . s/p icd (Guidant VItality ICD model [phone]), i mplanted 01/23/2007    . TONSILLECTOMY      in for   Chief Complaint  Patient presents with  . Fall     HPI  Mary Arellano  is a 84 y.o. female, with past medical history significant for congestive heart failure and A. fib, brought by her son for evaluation after a fall in the nursing home.  Patient has history of dementia but was able to deny any chest pains or palpitations.  She was trying to tie her shoe when she tripped .  Patient denies any headaches, dizziness, nausea or vomiting.  No history of fever or chills.  Patient has a history of mitral valve repair in 2006 her last echocardiogram was in 2008 showing an ejection fraction of around 15%.  Review of Systems    Unable to obtain due to patient's condition  Social History Social History   Tobacco Use  . Smoking status: Former Smoker    Last attempt to quit: 06/01/1951    Years since quitting: 66.2  . Smokeless tobacco: Never Used  Substance Use Topics  . Alcohol use: No     Family History No family history on file.   Prior to Admission medications   Medication Sig Start Date End Date Taking?  Authorizing Provider  acetaminophen (TYLENOL) 500 MG tablet Take 1,000 mg by mouth every 6 (six) hours as needed for mild pain or fever.  04/28/11  Yes Stacie Glaze, MD  amoxicillin (AMOXIL) 500 MG tablet Take 500 mg by mouth See admin instructions. Give 4 tabs 1 hour prior to dental work   Yes [provider]  benzonatate (TESSALON) 100 MG capsule Take 1 capsule (100 mg total) by mouth 2 (two) times daily as needed for cough. 04/20/17  Yes Nafziger, [name], NP  dextromethorphan (GNP COUGH DM ER) 30 MG/5ML liquid Take 30 mg by mouth 2 (two) times daily as needed for cough.   Yes [provider]  digoxin (LANOXIN) 0.125 MG tablet Take 0.125 mg by mouth every other day. Hold if pulse is less than 60.   Yes [provider]  ENSURE (ENSURE) Take 237 mLs by mouth daily.   Yes [provider]  furosemide (LASIX) 40 MG tablet Take 40 mg by mouth daily.    Yes [provider]  levothyroxine (SYNTHROID, LEVOTHROID) 25 MCG tablet Take 25 mcg by mouth daily.     Yes [provider]  metoprolol tartrate (LOPRESSOR) 50 MG tablet Take 1 tablet (50 mg total) by mouth daily. 11/12/16  Yes Nafziger, [name], NP  Multiple Vitamins-Minerals (CERTAVITE/ANTIOXIDANTS PO) Take 1 tablet by mouth daily.    Yes [provider]  nitrofurantoin (MACRODANTIN) 50 MG capsule Take 50 mg by mouth daily.   Yes [provider]  sodium chloride (OCEAN) 0.65 % nasal spray Place 1 spray into the nose as needed for congestion (Keeps at the bedside.).    Yes [provider]  warfarin (COUMADIN) 2.5 MG tablet Take 2.5 mg by mouth See admin instructions. Takes 2.5mg  on Sunday, Tuesday, Wednesday, Thursday, and Saturday.   Yes [provider]  warfarin (COUMADIN) 5 MG tablet Take 5 mg by mouth See admin instructions. Takes 5mg  on Monday and Friday.   Yes [provider]    Allergies  Allergen Reactions  . Simvastatin     REACTION: liver  function elevation    Physical Exam  Vitals  Blood pressure (!) 140/100, pulse (!) 113, temperature 98.4 F (36.9 C), temperature source Oral, resp. rate (!) 22, SpO2 97 %.   1. General well-developed, well-nourished female, tired  2.  Flat affect and insight, Not Suicidal or Homicidal, pleasantly confused.  3. No F.N deficits, grossly.  4. Ears and Eyes appear Normal, Conjunctivae clear, PERRLA. Moist Oral Mucosa.  5. Supple Neck, No JVD, No cervical lymphadenopathy appriciated, No Carotid Bruits.  6. Symmetrical Chest wall movement, bilateral basilar crackles.  7.  Irregularly irregular, normal S1-S2, systolic murmur.  8. Positive Bowel Sounds, Abdomen Soft, Non tender, No organomegaly appriciated,No rebound -guarding or rigidity.  9.  No Cyanosis, Normal Skin Turgor, No Skin Rash or Bruise.  10. Good muscle tone,  joints appear normal , no effusions, Normal ROM.    Data Review  CBC Recent Labs  Lab 08/08/17 0903  WBC 9.2  HGB 15.2*  HCT 45.1  PLT 157  MCV 91.7  MCH 30.9  MCHC 33.7  RDW 14.2  LYMPHSABS 1.0  MONOABS 0.7  EOSABS 0.1  BASOSABS 0.0   ------------------------------------------------------------------------------------------------------------------  Chemistries  Recent Labs  Lab 08/08/17 0929  NA 141  K 3.5  CL 99*  CO2 31  GLUCOSE 118*  BUN 17  CREATININE 0.96  CALCIUM 9.4  AST 36  ALT 31  ALKPHOS 65  BILITOT 1.7*   ------------------------------------------------------------------------------------------------------------------ CrCl cannot be calculated (Unknown ideal weight.). ------------------------------------------------------------------------------------------------------------------ No results for input(s): TSH, T4TOTAL, T3FREE, THYROIDAB in the last 72 hours.  Invalid input(s): FREET3   Coagulation profile Recent Labs  Lab 08/08/17 1648  INR 2.37    ------------------------------------------------------------------------------------------------------------------- No results for input(s): DDIMER in the last 72 hours. -------------------------------------------------------------------------------------------------------------------  Cardiac Enzymes No results for input(s): CKMB, TROPONINI, MYOGLOBIN in the last 168 hours.  Invalid input(s): CK ------------------------------------------------------------------------------------------------------------------ Invalid input(s): POCBNP   ---------------------------------------------------------------------------------------------------------------  Urinalysis    Component Value Date/Time   COLORURINE YELLOW 08/08/2017 1640   APPEARANCEUR CLEAR 08/08/2017 1640   LABSPEC 1.010 08/08/2017 1640   PHURINE 7.0 08/08/2017 1640   GLUCOSEU NEGATIVE 08/08/2017 1640   HGBUR NEGATIVE 08/08/2017 1640   HGBUR trace-lysed 03/25/2009 1532   BILIRUBINUR NEGATIVE 08/08/2017 1640   BILIRUBINUR n 06/07/2017 1509   KETONESUR 5 (A) 08/08/2017 1640   PROTEINUR NEGATIVE 08/08/2017  1640   UROBILINOGEN 0.2 06/07/2017 1509   UROBILINOGEN 1.0 05/30/2009 1304   NITRITE NEGATIVE 08/08/2017 1640   LEUKOCYTESUR NEGATIVE 08/08/2017 1640    ----------------------------------------------------------------------------------------------------------------   Imaging results:   Dg Chest 2 View  Result Date: 08/08/2017 CLINICAL DATA:  Dyspnea EXAM: CHEST - 2 VIEW COMPARISON:  06/03/2016 FINDINGS: Cardiac enlargement. Mitral valve replacement. AICD in satisfactory position and unchanged Cardiac enlargement with vascular congestion. Bilateral pleural effusions and bibasilar atelectasis. IMPRESSION: Congestive heart failure with bilateral effusions and bibasilar atelectasis. Electronically Signed   By: Marlan Palau M.D.   On: 08/08/2017 13:56    My personal review of EKG: Rhythm NSR, rate of 96 bpm and  PVCs    Assessment & Plan  1.  Congestive heart failure with history of mitral valve repair and ejection fraction of 15% with an echo done in 2008    Decompensated 2.  A. Fib on Coumadin , digoxin and metoprolol     Will increase metoprolol to twice daily since her heart rate was borderline increased  3.  Status post mitral valve replacement  Plan  Place on  Telemetry Serial troponins Lasix IV Check echocardiogram Grave prognosis    DVT Prophylaxis heparin  AM Labs Ordered, also please review Full Orders  Family Communication: Discussed with son at bedside and daughter , Eileen Stanford on the phone, they agree with management and goals of care  Code Status DNR  Disposition Plan: Undetermined  Time spent in minutes : 43 minutes  Condition GUARDED   @SIGNATURE @

## 2017-08-08 NOTE — Progress Notes (Signed)
ANTICOAGULATION CONSULT NOTE - Initial Consult  Pharmacy Consult for Coumadin Indication: atrial fibrillation  Allergies  Allergen Reactions  . Simvastatin     REACTION: liver function elevation    Patient Measurements:    Vital Signs: Temp: 97.4 F (36.3 C) (03/11 0836) BP: 142/79 (03/11 1522) Pulse Rate: 39 (03/11 1522)  Labs: Recent Labs    08/08/17 0903 08/08/17 0929  HGB 15.2*  --   HCT 45.1  --   PLT 157  --   CREATININE  --  0.96  CKTOTAL  --  54    CrCl cannot be calculated (Unknown ideal weight.).   Medical History: Past Medical History:  Diagnosis Date  . AF (atrial fibrillation) (HCC)   . CHF NYHA class III (HCC)   . Hyperlipidemia   . Long term (current) use of anticoagulants   . Myxomatous mitral valve    post mitral valve replacement 2006  . Other primary cardiomyopathies     Medications:  PTA Warfarin: Coumadin 2.5mg  daily except 5mg  on Monday & Friday  Assessment: 84 yo F admitted with HF exacerbation on chronic Coumadin for Afib.  She is from [facility]- per [facility] clinic records she has been therapeutic on warfarin dose for several months.   CBC WNL, no bleeding noted today.  INR therapeutic on admission (2.37)  Goal of Therapy:  INR 2-3   Plan:  Coumadin 5mg  po x1 today per home regimen Daily INR  Elson Clan 08/08/2017,4:31 PM

## 2017-08-08 NOTE — Telephone Encounter (Signed)
To Dr. Cooper as FYI. 

## 2017-08-08 NOTE — ED Notes (Signed)
Bed: WA14 Expected date:  Expected time:  Means of arrival:  Comments: EMS FALL 

## 2017-08-08 NOTE — ED Provider Notes (Signed)
[facility]-EMERGENCY DEPT Provider Note   CSN: [id number] Arrival date & time: 08/08/17  2229     History   Chief Complaint Chief Complaint  Patient presents with  . Fall    HPI Mary Arellano is a 84 y.o. female.  HPI With dementia presents from nursing facility after a fall, and possible prolonged time on the ground. The patient does have dementia, she is awake and alert, oriented to herself, recalls the fall, but does not recall much more than that. She states that she fell yesterday, unclear circumstances, and overnight was on the ground. She denies pain, states that she feels generally weak, denies nausea. Nursing reports that the patient was last seen yesterday, found today on the ground. She is found to have some ecchymotic changes about her left face, but no other evidence for full. No report of change from baseline interactivity.   Past Medical History:  Diagnosis Date  . AF (atrial fibrillation) (HCC)   . CHF NYHA class III (HCC)   . Hyperlipidemia   . Long term (current) use of anticoagulants   . Myxomatous mitral valve    post mitral valve replacement 2006  . Other primary cardiomyopathies     Patient Active Problem List   Diagnosis Date Noted  . Long term (current) use of anticoagulants 03/07/2017  . Encounter for therapeutic drug monitoring 11/22/2013  . Left hip pain 05/04/2011  . TRANSAMINASES, SERUM, ELEVATED 02/24/2010  . PAROXYSMAL VENTRICULAR TACHYCARDIA 11/28/2008  . ICD-[employer] 11/28/2008  . HYPOTHYROIDISM, SECONDARY 11/27/2008  . UTI'S, RECURRENT 10/11/2008  . SYNCOPE 04/17/2008  . MITRAL STENOSIS/ INSUFFICIENCY, NON-RHEUMATIC 04/16/2008  . SYSTOLIC HEART FAILURE, CHRONIC 04/16/2008  . HYPERLIPIDEMIA, TYPE II 02/27/2008  . SYMPTOM, MEMORY LOSS 01/02/2007  . ADVEF, DRUG/MEDICINAL/BIOLOGICAL SUBST NOS 01/02/2007  . CARDIOMYOPATHY 12/30/2006  . ATRIAL FIBRILLATION 12/21/2006    Past Surgical History:    Procedure Laterality Date  . HEMORROIDECTOMY    . implantable defebrillator    . mitral valve replacement 2006 (cow valve)    . s/p icd ([employer] V[employer]ality ICD model [id number]), i mplanted 01/23/2007    . TONSILLECTOMY      OB History    No data available       Home Medications    Prior to Admission medications   Medication Sig Start Date End Date Taking? Authorizing Provider  acetaminophen (TYLENOL) 500 MG tablet Take 2 tablets every 6-8 hours as needed for pain 04/28/11   Stacie Glaze, MD  amoxicillin (AMOXIL) 500 MG tablet Take 500 mg by mouth. 4 tabs 1 hours prior to dental work     [provider]  benzonatate (TESSALON) 100 MG capsule Take 1 capsule (100 mg total) by mouth 2 (two) times daily as needed for cough. 04/20/17   Nafziger, [name], NP  digoxin (LANOXIN) 0.125 MG tablet Take 125 mcg by mouth every other day.      [provider]  furosemide (LASIX) 40 MG tablet Take 40 mg by mouth daily.     [provider]  levothyroxine (SYNTHROID, LEVOTHROID) 25 MCG tablet Take 25 mcg by mouth daily.      [provider]  metoprolol tartrate (LOPRESSOR) 50 MG tablet Take 1 tablet (50 mg total) by mouth daily. 11/12/16   Nafziger, [name], NP  Multiple Vitamins-Minerals (CERTAVITE/ANTIOXIDANTS PO) Take by mouth.    [provider]  nitrofurantoin (MACRODANTIN) 50 MG capsule Take 50 mg by mouth daily.    [provider]  Nutritional Supplements (ENSURE HIGH PROTEIN PO) Take by mouth daily.     [provider]  sodium chloride (OCEAN) 0.65 % nasal spray Place 1 spray into the nose as needed for congestion.     [provider]  warfarin (COUMADIN) 2.5 MG tablet Take 2.5 mg by mouth daily.    [provider]  warfarin (COUMADIN) 5 MG tablet Take 5 mg by mouth daily.    [provider]    Family History No family history on file.  Social History Social History   Tobacco Use  . Smoking status: Former  Smoker    Last attempt to quit: 06/01/1951    Years since quitting: 66.2  . Smokeless tobacco: Never Used  Substance Use Topics  . Alcohol use: No  . Drug use: No     Allergies   Simvastatin   Review of Systems Review of Systems  Unable to perform ROS: Dementia     Physical Exam Updated Vital Signs BP (!) 152/80 (BP Location: Left Arm)   Pulse 100   Temp (!) 97.4 F (36.3 C)   SpO2 93%   Physical Exam  Constitutional: She has a sickly appearance. No distress.  Elderly female presenting with a towel roll immobilizer around her neck  HENT:  Head: Normocephalic.  Minor ecchymosis around the left eye, no tenderness to palpation, no substantial swelling  Eyes: Conjunctivae and EOM are normal.  Neck: No spinous process tenderness and no muscular tenderness present. No neck rigidity. No edema, no erythema and normal range of motion present.  Cardiovascular: Normal rate and regular rhythm.  Pulmonary/Chest: Tachypnea noted. She has decreased breath sounds.  Abdominal: She exhibits no distension.  Musculoskeletal: She exhibits no edema.  Neurological: She is alert. She displays atrophy. No cranial nerve deficit.  Skin: Skin is warm and dry.  Psychiatric: She is withdrawn.  Nursing note and vitals reviewed.    ED Treatments / Results  Labs (all labs ordered are listed, but only abnormal results are displayed) Labs Reviewed  CBC WITH DIFFERENTIAL/PLATELET - Abnormal; Notable for the following components:      Result Value   Hemoglobin 15.2 (*)    All other components within normal limits  COMPREHENSIVE METABOLIC PANEL - Abnormal; Notable for the following components:   Chloride 99 (*)    Glucose, Bld 118 (*)    Total Bilirubin 1.7 (*)    GFR calc non Af Amer 51 (*)    GFR calc Af Amer 59 (*)    All other components within normal limits  BRAIN NATRIURETIC PEPTIDE - Abnormal; Notable for the following components:   B Natriuretic Peptide 242.6 (*)    All other  components within normal limits  CBG MONITORING, ED - Abnormal; Notable for the following components:   Glucose-Capillary 113 (*)    All other components within normal limits  CK  URINALYSIS, ROUTINE W REFLEX MICROSCOPIC    EKG  EKG Interpretation  Date/Time:  Monday August 08 2017 09:07:13 EDT Ventricular Rate:  96 PR Interval:    QRS Duration: 102 QT Interval:  339 QTC Calculation: 429 R Axis:   105 Text Interpretation:  Atrial fibrillation Ventricular premature complex visible portions are similar to prior study, but there is substantial artefact and wander Abnormal ekg Confirmed by Gerhard Munch [phone]) on 08/08/2017 9:19:41 AM       Radiology Dg Chest 2 View  Result Date: 08/08/2017 CLINICAL DATA:  Dyspnea EXAM: CHEST - 2 VIEW COMPARISON:  06/03/2016 FINDINGS:  Cardiac enlargement. Mitral valve replacement. AICD in satisfactory position and unchanged Cardiac enlargement with vascular congestion. Bilateral pleural effusions and bibasilar atelectasis. IMPRESSION: Congestive heart failure with bilateral effusions and bibasilar atelectasis. Electronically Signed   By: Marlan Palau M.D.   On: 08/08/2017 13:56    Procedures Procedures (including critical care time)  Medications Ordered in ED Medications  furosemide (LASIX) injection 80 mg (80 mg Intravenous Given 08/08/17 1512)    Patient has oxygen saturation 90/91% on room air, tachypnea, with respiratory rate around 30.  Initial Impression / Assessment and Plan / ED Course  I have reviewed the triage vital signs and the nursing notes.  Pertinent labs & imaging results that were available during my care of the patient were reviewed by me and considered in my medical decision making (see chart for details).      Update:, Patient son is now present, he notes that the patient is interacting in a normal manner, though she has increased work of breathing, noticeable to him.  Patient herself states that she feels weak, but  otherwise denies pain. Initial findings consistent with heart failure exacerbation, with BNP greater than 200, and given her tachypnea, hypoxia, patient will receive IV Lasix, 80 mg, double her home dose, and be admitted for further evaluation, management given the fall, unclear circumstances, and her history of dementia.    Final Clinical Impressions(s) / ED Diagnoses  Fall, initial encounter Heart failure exacerbation   Gerhard Munch, MD 08/08/17 1515

## 2017-08-09 ENCOUNTER — Inpatient Hospital Stay (HOSPITAL_COMMUNITY): Payer: Medicare PPO

## 2017-08-09 ENCOUNTER — Encounter (HOSPITAL_COMMUNITY): Payer: Self-pay | Admitting: *Deleted

## 2017-08-09 DIAGNOSIS — R627 Adult failure to thrive: Secondary | ICD-10-CM

## 2017-08-09 DIAGNOSIS — I361 Nonrheumatic tricuspid (valve) insufficiency: Secondary | ICD-10-CM

## 2017-08-09 DIAGNOSIS — F0150 Vascular dementia without behavioral disturbance: Secondary | ICD-10-CM

## 2017-08-09 DIAGNOSIS — I5041 Acute combined systolic (congestive) and diastolic (congestive) heart failure: Secondary | ICD-10-CM

## 2017-08-09 DIAGNOSIS — E876 Hypokalemia: Secondary | ICD-10-CM

## 2017-08-09 DIAGNOSIS — I482 Chronic atrial fibrillation: Secondary | ICD-10-CM

## 2017-08-09 LAB — BASIC METABOLIC PANEL
ANION GAP: 12 (ref 5–15)
BUN: 17 mg/dL (ref 6–20)
CHLORIDE: 96 mmol/L — AB (ref 101–111)
CO2: 29 mmol/L (ref 22–32)
Calcium: 9 mg/dL (ref 8.9–10.3)
Creatinine, Ser: 0.88 mg/dL (ref 0.44–1.00)
GFR calc non Af Amer: 57 mL/min — ABNORMAL LOW (ref >60)
Glucose, Bld: 126 mg/dL — ABNORMAL HIGH (ref 65–99)
Potassium: 3.2 mmol/L — ABNORMAL LOW (ref 3.5–5.1)
Sodium: 137 mmol/L (ref 135–145)

## 2017-08-09 LAB — PROTIME-INR
INR: 2.94
Prothrombin Time: 30.4 seconds — ABNORMAL HIGH (ref 11.4–15.2)

## 2017-08-09 LAB — TROPONIN I
TROPONIN I: 0.17 ng/mL — AB (ref <0.03)
Troponin I: 0.2 ng/mL (ref <0.03)

## 2017-08-09 LAB — ECHOCARDIOGRAM COMPLETE
HEIGHTINCHES: 63 in
Weight: 1809.54 oz

## 2017-08-09 MED ORDER — POTASSIUM CHLORIDE CRYS ER 20 MEQ PO TBCR
40.0000 meq | EXTENDED_RELEASE_TABLET | Freq: Once | ORAL | Status: AC
  Administered 2017-08-09: 40 meq via ORAL
  Filled 2017-08-09: qty 2

## 2017-08-09 MED ORDER — WARFARIN SODIUM 1 MG PO TABS
1.0000 mg | ORAL_TABLET | Freq: Once | ORAL | Status: AC
  Administered 2017-08-09: 1 mg via ORAL
  Filled 2017-08-09: qty 1

## 2017-08-09 NOTE — Progress Notes (Signed)
PROGRESS NOTE  Mary Arellano ZOX:[id number] DOB: May 07, 1927 DOA: 08/08/2017 PCP: [name], NP  HPI/Recap of past 24 hours:  Frail elderly, pleasantly demented, denies pain, Reports fall down while trying to tie her shoes, she has ecchymosis on left eye lid Son at bedside  Assessment/Plan: Active Problems:   Congestive heart failure (CHF) (HCC)  Acute on chronic systolic CHF -She presented with increased falls, dyspnea on exertion, tachypnea RR 34 , O2 sat 90% on room air at rest, increased to 97% on 2 L on presentation -cxr on presentation "Congestive heart failure with bilateral effusions and bibasilar atelectasis." -last echo from 2008 ef15% s/p icd placement for primary prevention, repeat echo pending, troponin peaked at 0.2, she denies chest pain -Device no longer functioning per cardiology, "the decision was made to NOT pursue generator change considering her clinical stability and advanced age. " -cardiology consulted, meds adjustment per cardiology, cardiology also recommended palliative care consult, detail please refer to cardiology note from 3/12.   H/o mitral regurgitation status post mitral valve replacement with a bioprosthetic valve  Chronic afib, rate controlled with frequent pvc's -continue dig ( dig level pending), lopressor, warfarin per pharmacy dosing   Hypokalemia: replace k, check mag  Fall at home: ct head no acute findings.   Dementia/FTT: from ALF  Code Status: DNR  Family Communication: patient and son at bedside  Disposition Plan: pending   Consultants:  Cardiology  Palliative care  Procedures:  none  Antibiotics:  none   Objective: BP (!) 142/98 (BP Location: Left Arm)   Pulse 73   Temp 97.8 F (36.6 C) (Oral)   Resp (!) 22   Ht 5\' 3"  (1.6 m)   Wt 51.3 kg (113 lb 1.5 oz)   SpO2 95%   BMI 20.03 kg/m   Intake/Output Summary (Last 24 hours) at 08/09/2017 1002 Last data filed at 08/09/2017 0600 Gross per 24  hour  Intake 100 ml  Output 500 ml  Net -400 ml   Filed Weights   08/08/17 2002  Weight: 51.3 kg (113 lb 1.5 oz)    Exam: Patient is examined daily including today on 08/09/2017, exams remain the same as of yesterday except that has changed    General:  Frail, pleasantly demented, oriented to person only, NAD  Cardiovascular: IRRR  Respiratory: bibasilar crackles  Abdomen: Soft/ND/NT, positive BS  Musculoskeletal: No Edema  Neuro: alert, oriented to person only, follow commands  Data Reviewed: Basic Metabolic Panel: Recent Labs  Lab 08/08/17 0929 08/09/17 0534  NA 141 137  K 3.5 3.2*  CL 99* 96*  CO2 31 29  GLUCOSE 118* 126*  BUN 17 17  CREATININE 0.96 0.88  CALCIUM 9.4 9.0   Liver Function Tests: Recent Labs  Lab 08/08/17 0929  AST 36  ALT 31  ALKPHOS 65  BILITOT 1.7*  PROT 6.8  ALBUMIN 3.5   No results for input(s): LIPASE, AMYLASE in the last 168 hours. No results for input(s): AMMONIA in the last 168 hours. CBC: Recent Labs  Lab 08/08/17 0903  WBC 9.2  NEUTROABS 7.5  HGB 15.2*  HCT 45.1  MCV 91.7  PLT 157   Cardiac Enzymes:   Recent Labs  Lab 08/08/17 0929 08/08/17 1804 08/08/17 2334 08/09/17 0534  CKTOTAL 54  --   --   --   TROPONINI  --  0.15* 0.20* 0.17*   BNP (last 3 results) Recent Labs    08/08/17 0902  BNP 242.6*    ProBNP (last  3 results) No results for input(s): PROBNP in the last 8760 hours.  CBG: Recent Labs  Lab 08/08/17 0851  GLUCAP 113*    Recent Results (from the past 240 hour(s))  MRSA PCR Screening     Status: Abnormal   Collection Time: 08/08/17  8:42 PM  Result Value Ref Range Status   MRSA by PCR POSITIVE (A) NEGATIVE Final    Comment:        The GeneXpert MRSA Assay (FDA approved for NASAL specimens only), is one component of a comprehensive MRSA colonization surveillance program. It is not intended to diagnose MRSA infection nor to guide or monitor treatment for MRSA infections. RESULT  CALLED TO, READ BACK BY AND VERIFIED WITH: M.STEFFENS RN 08/08/2017 2257 JR Performed at [facility], [address] [place], [place] [address]      Studies: Dg Chest 2 View  Result Date: 08/08/2017 CLINICAL DATA:  Dyspnea EXAM: CHEST - 2 VIEW COMPARISON:  06/03/2016 FINDINGS: Cardiac enlargement. Mitral valve replacement. AICD in satisfactory position and unchanged Cardiac enlargement with vascular congestion. Bilateral pleural effusions and bibasilar atelectasis. IMPRESSION: Congestive heart failure with bilateral effusions and bibasilar atelectasis. Electronically Signed   By: Marlan Palau M.D.   On: 08/08/2017 13:56    Scheduled Meds: . Chlorhexidine Gluconate Cloth  6 each Topical Q0600  . digoxin  0.125 mg Oral QODAY  . furosemide  40 mg Intravenous Q12H  . levothyroxine  25 mcg Oral QAC breakfast  . metoprolol tartrate  50 mg Oral BID  . mupirocin ointment  1 application Nasal BID  . nitrofurantoin  50 mg Oral Daily  . Warfarin - Pharmacist Dosing Inpatient   Does not apply q1800    Continuous Infusions:   Time spent: , case discussed with cardiology I have personally reviewed and interpreted on  08/09/2017 daily labs, tele strips, imagings as discussed above under date review session and assessment and plans.  I reviewed all nursing notes, pharmacy notes, consultant notes,  vitals, pertinent old records  I have discussed plan of care as described above with RN , patient and family on 08/09/2017   Albertine Grates MD, PhD  [facility] Pager [phone]. If 7PM-7AM, please contact night-coverage at [email or web address], password [facility] 08/09/2017, 10:02 AM  LOS: 1 day

## 2017-08-09 NOTE — Progress Notes (Addendum)
ANTICOAGULATION CONSULT NOTE - Initial Consult  Pharmacy Consult for Coumadin Indication: atrial fibrillation  Allergies  Allergen Reactions  . Simvastatin     REACTION: liver function elevation   Patient Measurements: Height: 5\' 3"  (160 cm) Weight: 113 lb 1.5 oz (51.3 kg) IBW/kg (Calculated) : 52.4  Vital Signs: Temp: 97.8 F (36.6 C) (03/11 2149) Temp Source: Oral (03/12 0517) BP: 142/98 (03/12 0517) Pulse Rate: 73 (03/12 0517)  Labs: Recent Labs    08/08/17 0903 08/08/17 0929 08/08/17 1648 08/08/17 1804 08/08/17 2334 08/09/17 0534  HGB 15.2*  --   --   --   --   --   HCT 45.1  --   --   --   --   --   PLT 157  --   --   --   --   --   LABPROT  --   --  25.7*  --   --  30.4*  INR  --   --  2.37  --   --  2.94  CREATININE  --  0.96  --   --   --  0.88  CKTOTAL  --  54  --   --   --   --   TROPONINI  --   --   --  0.15* 0.20* 0.17*   Estimated Creatinine Clearance: 35.8 mL/min (by C-G formula based on SCr of 0.88 mg/dL).  Medical History: Past Medical History:  Diagnosis Date  . AF (atrial fibrillation) (HCC)   . CHF NYHA class III (HCC)   . Hyperlipidemia   . Long term (current) use of anticoagulants   . Myxomatous mitral valve    post mitral valve replacement 2006  . Other primary cardiomyopathies    Medications:  PTA Warfarin: Coumadin 2.5mg  daily except 5mg  on Monday & Friday  Assessment: 84 yo F admitted with HF exacerbation on chronic Coumadin for Afib.  She is from [facility]- per [facility] clinic records she has been therapeutic on warfarin dose for several months.   CBC WNL, no bleeding noted today.  INR therapeutic on admission (2.37)  Today, 08/09/2017 INR increased after 5mg  Warfarin yesterday Regular diet, eating breakfast  Goal of Therapy:  INR 2-3   Plan:   Warfarin 1mg  today at 1800  Daily INR  Otho Bellows PharmD Pager [phone] 08/09/2017, 10:26 AM

## 2017-08-09 NOTE — Consult Note (Addendum)
Cardiology Consultation:   Patient ID: Rhaelyn Hambleton Hollern; [id number]; [birth year]   Admit date: 08/08/2017 Date of Consult: 08/09/2017  Primary Care Provider: [name], NP Primary Cardiologist: Dr Excell Seltzer Primary Electrophysiologist:  Dr Ladona Ridgel   Patient Profile:   Donna Leatherwood Hoppel is a 84 y.o. female with a hx of chronic systolic CHF, NICM, CAF, mild dementia, and s/p tissue MVR in 2006 who is being seen today for the evaluation of CHF at the request of Dr Roda Shutters.  History of Present Illness:   Ms. Fittipaldi is a pleasant 84 y/o female followed by Dr Excell Seltzer with a history of MVR (tissue) in 2006. She had normal coronaries at that time. In 2008 she had a Guidant ICD implanted for SCD. EP has decided not to upgrade her device based on her advanced age and overall debility.  She has CAF and is on Coumadin. Her last EF was 15% in 2008. She is a resident of an assisted living facility. Her son tells me she gets up and around at her facility with a walker. She is admitted now after a fall at her facility. In the ED CXR suggested CHF. The pt did admit to me she has noticed some DOE the past few days. She has mild dementia and her history is not completely reliable but her son confirms gradual decline over the past several weeks.    Past Medical History:  Diagnosis Date  . AF (atrial fibrillation) (HCC)   . CHF NYHA class III (HCC)   . Hyperlipidemia   . Long term (current) use of anticoagulants   . Myxomatous mitral valve    post mitral valve replacement 2006  . Other primary cardiomyopathies     Past Surgical History:  Procedure Laterality Date  . HEMORROIDECTOMY    . implantable defebrillator    . mitral valve replacement 2006 (cow valve)    . s/p icd (Guidant VItality ICD model [phone]), i mplanted 01/23/2007    . TONSILLECTOMY       Home Medications:  Prior to Admission medications   Medication Sig Start Date End Date Taking? Authorizing Provider  acetaminophen (TYLENOL)  500 MG tablet Take 1,000 mg by mouth every 6 (six) hours as needed for mild pain or fever.  04/28/11  Yes Stacie Glaze, MD  amoxicillin (AMOXIL) 500 MG tablet Take 500 mg by mouth See admin instructions. Give 4 tabs 1 hour prior to dental work   Yes [provider]  benzonatate (TESSALON) 100 MG capsule Take 1 capsule (100 mg total) by mouth 2 (two) times daily as needed for cough. 04/20/17  Yes Nafziger, [name], NP  dextromethorphan (GNP COUGH DM ER) 30 MG/5ML liquid Take 30 mg by mouth 2 (two) times daily as needed for cough.   Yes [provider]  digoxin (LANOXIN) 0.125 MG tablet Take 0.125 mg by mouth every other day. Hold if pulse is less than 60.   Yes [provider]  ENSURE (ENSURE) Take 237 mLs by mouth daily.   Yes [provider]  furosemide (LASIX) 40 MG tablet Take 40 mg by mouth daily.    Yes [provider]  levothyroxine (SYNTHROID, LEVOTHROID) 25 MCG tablet Take 25 mcg by mouth daily.     Yes [provider]  metoprolol tartrate (LOPRESSOR) 50 MG tablet Take 1 tablet (50 mg total) by mouth daily. 11/12/16  Yes Nafziger, [name], NP  Multiple Vitamins-Minerals (CERTAVITE/ANTIOXIDANTS PO) Take 1 tablet by mouth daily.    Yes [provider]  nitrofurantoin (MACRODANTIN) 50 MG capsule Take 50 mg by mouth daily.   Yes [provider]  sodium chloride (OCEAN) 0.65 % nasal spray Place 1 spray into the nose as needed for congestion (Keeps at the bedside.).    Yes [provider]  warfarin (COUMADIN) 2.5 MG tablet Take 2.5 mg by mouth See admin instructions. Takes 2.5mg  on Sunday, Tuesday, Wednesday, Thursday, and Saturday.   Yes [provider]  warfarin (COUMADIN) 5 MG tablet Take 5 mg by mouth See admin instructions. Takes 5mg  on Monday and Friday.   Yes [provider]    Inpatient Medications: Scheduled Meds: . Chlorhexidine Gluconate Cloth  6 each Topical Q0600  . digoxin  0.125 mg Oral  QODAY  . furosemide  40 mg Intravenous Q12H  . levothyroxine  25 mcg Oral QAC breakfast  . metoprolol tartrate  50 mg Oral BID  . mupirocin ointment  1 application Nasal BID  . nitrofurantoin  50 mg Oral Daily  . Warfarin - Pharmacist Dosing Inpatient   Does not apply q1800   Continuous Infusions:  PRN Meds: acetaminophen, ondansetron **OR** ondansetron (ZOFRAN) IV, sodium chloride, zolpidem  Allergies:    Allergies  Allergen Reactions  . Simvastatin     REACTION: liver function elevation    Social History:   Social History   Socioeconomic History  . Marital status: Widowed    Spouse name: Not on file  . Number of children: Not on file  . Years of education: Not on file  . Highest education level: Not on file  Social Needs  . Financial resource strain: Not on file  . Food insecurity - worry: Not on file  . Food insecurity - inability: Not on file  . Transportation needs - medical: Not on file  . Transportation needs - non-medical: Not on file  Occupational History  . Occupation: retired    Associate Professor: RETIRED  . Occupation: lives in [employer]  Tobacco Use  . Smoking status: Former Smoker    Last attempt to quit: 06/01/1951    Years since quitting: 66.2  . Smokeless tobacco: Never Used  Substance and Sexual Activity  . Alcohol use: No  . Drug use: No  . Sexual activity: Not Currently  Other Topics Concern  . Not on file  Social History Narrative  . Not on file    Family History:   FM HX- no family history of early CAD  ROS:  Please see the history of present illness.  All other ROS reviewed and negative.     Physical Exam/Data:   Vitals:   08/08/17 1753 08/08/17 2002 08/08/17 2149 08/09/17 0517  BP: (!) 140/100  (!) 152/69 (!) 142/98  Pulse: (!) 113  70 73  Resp: (!) 22  20 (!) 22  Temp:   97.8 F (36.6 C)   TempSrc:   Oral Oral  SpO2: 97%  93% 95%  Weight:  113 lb 1.5 oz (51.3 kg)    Height:  5\' 3"  (1.6 m)      Intake/Output Summary (Last  24 hours) at 08/09/2017 1004 Last data filed at 08/09/2017 0600 Gross per 24 hour  Intake 100 ml  Output 500 ml  Net -400 ml   Filed Weights   08/08/17 2002  Weight: 113 lb 1.5 oz (51.3 kg)   Body mass index is 20.03 kg/m.  General:  Cachectic female NAD HEENT: normal Lymph: no adenopathy Neck: JVD noted sitting in reclined positiion Endocrine:  No  thryomegaly Vascular: No carotid bruits; FA pulses 2+ bilaterally without bruits  Cardiac:  normal S1, S2; RRR; no murmur  Lungs:  Decreased breath sounds overall Abd: soft, nontender, no hepatomegaly  Ext: no edema Musculoskeletal:  No deformities, BUE and BLE strength normal and equal Skin: warm and dry  Neuro:  CNs 2-12 intact, no focal abnormalities noted Psych:  Normal affect   EKG:  The EKG was personally reviewed and demonstrates:  AF- PVCs Telemetry:  Telemetry was personally reviewed and demonstrates:  AF PVCs  Relevant CV Studies: Echo pending  Laboratory Data:  Chemistry Recent Labs  Lab 08/08/17 0929 08/09/17 0534  NA 141 137  K 3.5 3.2*  CL 99* 96*  CO2 31 29  GLUCOSE 118* 126*  BUN 17 17  CREATININE 0.96 0.88  CALCIUM 9.4 9.0  GFRNONAA 51* 57*  GFRAA 59* >60  ANIONGAP 11 12    Recent Labs  Lab 08/08/17 0929  PROT 6.8  ALBUMIN 3.5  AST 36  ALT 31  ALKPHOS 65  BILITOT 1.7*   Hematology Recent Labs  Lab 08/08/17 0903  WBC 9.2  RBC 4.92  HGB 15.2*  HCT 45.1  MCV 91.7  MCH 30.9  MCHC 33.7  RDW 14.2  PLT 157   Cardiac Enzymes Recent Labs  Lab 08/08/17 1804 08/08/17 2334 08/09/17 0534  TROPONINI 0.15* 0.20* 0.17*   No results for input(s): TROPIPOC in the last 168 hours.  BNP Recent Labs  Lab 08/08/17 0902  BNP 242.6*    DDimer No results for input(s): DDIMER in the last 168 hours.  Radiology/Studies:  Dg Chest 2 View  Result Date: 08/08/2017 CLINICAL DATA:  Dyspnea EXAM: CHEST - 2 VIEW COMPARISON:  06/03/2016 FINDINGS: Cardiac enlargement. Mitral valve replacement. AICD  in satisfactory position and unchanged Cardiac enlargement with vascular congestion. Bilateral pleural effusions and bibasilar atelectasis. IMPRESSION: Congestive heart failure with bilateral effusions and bibasilar atelectasis. Electronically Signed   By: Marlan Palau M.D.   On: 08/08/2017 13:56    Assessment and Plan:   Acute on chronic CHF She is getting IV Lasix.  See below.   Consider Palliative Care consult if her echo confirms severely depressed EF.  NICM EF in 2008 was 15%- echo this admission is pending  S/P tissue MVR Surgery 2006, normal coronaries then  SCD Guidant ICD implanted 2008. The decision has been made not to replace this  CAF Overall rate controlled, frequent PVCs  Coumadin Rx INR goal 2-3  Dementia She has developed some short term memory problems in the past months.  Plan: Continue IV diuretics. (see below)    For questions or updates, please contact [facility] Please consult [email or web address] for contact info under Cardiology/STEMI.   Signed, Corine Shelter, PA-C  08/09/2017 10:04 AM   History and all data above reviewed.  Patient examined.  I agree with the findings as above.  We are consulted because the troponin has been mildly elevated.  Trend is flat.  The patient is not able to answer many questions as her memory is failing her and her son is not in the room.  He was in the room earlier and provided information as above.  The patient does recall falling.  She has been increasingly frail.  She has SOB.  She reports SOB in the room although on RA her pulse ox was 93.  She denies any pain but reports that she does feel more SOB than she used to.     The patient exam  reveals COR  Irregular ,  Lungs: Decreased breath sounds  ,  Abd: Positive bowel sounds, no rebound no guarding, Ext No edema  .  All available labs, radiology testing, previous records reviewed. Agree with documented assessment and plan.   Acute on chronic systolic and diastolic HF:  I  reviewed the echo images personally.  This is reported above.  She appears to have severe mitral stenosis.  She has severe PI and TR and elevated pulmonary pressures.  Diuresis in the situation is difficult because CO is somewhat volume dependent.  She is not a candidate for further invasive evaluation or treatment.   She has a mildly elevated BNP and some fluid on her CXR.  However, I am not sure that hypervolemia is the significant issue.  I will give IV Lasix 80 mg for the next couple of doses.  She received this once and otherwise she has had 40 mg IV bid.  I will check a digoxin level.  Otherwise no significant change in therapy.     Elevated troponin:  Probably secondary to CHF and no further evaluation indicated.    Agree with Palliative Care involvement.    Fayrene Fearing [place] Gipe  11:42 AM  08/09/2017

## 2017-08-09 NOTE — Progress Notes (Signed)
  Echocardiogram 2D Echocardiogram has been performed.  Mary Arellano M 08/09/2017, 9:55 AM

## 2017-08-09 NOTE — Progress Notes (Signed)
Follow-up: Notified by RN regarding second troponin of 0.20 from 0.15 at 1800. There have no c/o CP since admission. Pt remains in A-Fib, rate controlled. Discussed w/ Dr Electa Sniff w/ cardiology service who did not recommend starting heparin w/ therapeutic INR on Coumadin. Recommended re-consulting cardiology this am, if troponin continues to trend up. Will continue to monitor closely on telemetry.   Leanne Chang, NP-C [facility] Pager [phone]

## 2017-08-10 DIAGNOSIS — F0390 Unspecified dementia without behavioral disturbance: Secondary | ICD-10-CM

## 2017-08-10 DIAGNOSIS — E876 Hypokalemia: Secondary | ICD-10-CM

## 2017-08-10 LAB — CBC WITH DIFFERENTIAL/PLATELET
BASOS ABS: 0.1 10*3/uL (ref 0.0–0.1)
BASOS PCT: 1 %
EOS ABS: 0.2 10*3/uL (ref 0.0–0.7)
EOS PCT: 2 %
HCT: 43.9 % (ref 36.0–46.0)
Hemoglobin: 14.8 g/dL (ref 12.0–15.0)
LYMPHS PCT: 17 %
Lymphs Abs: 1.5 10*3/uL (ref 0.7–4.0)
MCH: 31.2 pg (ref 26.0–34.0)
MCHC: 33.7 g/dL (ref 30.0–36.0)
MCV: 92.6 fL (ref 78.0–100.0)
MONO ABS: 1.1 10*3/uL — AB (ref 0.1–1.0)
Monocytes Relative: 12 %
Neutro Abs: 5.7 10*3/uL (ref 1.7–7.7)
Neutrophils Relative %: 68 %
PLATELETS: 178 10*3/uL (ref 150–400)
RBC: 4.74 MIL/uL (ref 3.87–5.11)
RDW: 14.3 % (ref 11.5–15.5)
WBC: 8.5 10*3/uL (ref 4.0–10.5)

## 2017-08-10 LAB — DIGOXIN LEVEL: DIGOXIN LVL: 0.7 ng/mL — AB (ref 0.8–2.0)

## 2017-08-10 LAB — PROTIME-INR
INR: 3.06
PROTHROMBIN TIME: 31.4 s — AB (ref 11.4–15.2)

## 2017-08-10 LAB — COMPREHENSIVE METABOLIC PANEL
ALBUMIN: 3.1 g/dL — AB (ref 3.5–5.0)
ALT: 27 U/L (ref 14–54)
AST: 39 U/L (ref 15–41)
Alkaline Phosphatase: 66 U/L (ref 38–126)
Anion gap: 12 (ref 5–15)
BUN: 25 mg/dL — AB (ref 6–20)
CHLORIDE: 99 mmol/L — AB (ref 101–111)
CO2: 29 mmol/L (ref 22–32)
Calcium: 9.4 mg/dL (ref 8.9–10.3)
Creatinine, Ser: 0.88 mg/dL (ref 0.44–1.00)
GFR calc Af Amer: >60 mL/min (ref >60)
GFR calc non Af Amer: 57 mL/min — ABNORMAL LOW (ref >60)
Glucose, Bld: 113 mg/dL — ABNORMAL HIGH (ref 65–99)
POTASSIUM: 3.7 mmol/L (ref 3.5–5.1)
SODIUM: 140 mmol/L (ref 135–145)
Total Bilirubin: 1.8 mg/dL — ABNORMAL HIGH (ref 0.3–1.2)
Total Protein: 6.5 g/dL (ref 6.5–8.1)

## 2017-08-10 LAB — MAGNESIUM: Magnesium: 1.8 mg/dL (ref 1.7–2.4)

## 2017-08-10 MED ORDER — POLYETHYLENE GLYCOL 3350 17 G PO PACK
17.0000 g | PACK | Freq: Every day | ORAL | Status: DC
  Administered 2017-08-10 – 2017-08-12 (×3): 17 g via ORAL
  Filled 2017-08-10 (×3): qty 1

## 2017-08-10 MED ORDER — WARFARIN SODIUM 1 MG PO TABS
1.0000 mg | ORAL_TABLET | Freq: Once | ORAL | Status: AC
  Administered 2017-08-10: 1 mg via ORAL
  Filled 2017-08-10: qty 1

## 2017-08-10 MED ORDER — FUROSEMIDE 40 MG PO TABS
40.0000 mg | ORAL_TABLET | Freq: Every day | ORAL | Status: DC
  Administered 2017-08-11 – 2017-08-12 (×2): 40 mg via ORAL
  Filled 2017-08-10 (×2): qty 1

## 2017-08-10 NOTE — Progress Notes (Signed)
ANTICOAGULATION CONSULT NOTE - Follow-Up Consult  Pharmacy Consult for Coumadin Indication: atrial fibrillation  Allergies  Allergen Reactions  . Simvastatin     REACTION: liver function elevation   Patient Measurements: Height: 5\' 3"  (160 cm) Weight: 113 lb 1.5 oz (51.3 kg) IBW/kg (Calculated) : 52.4  Vital Signs: Temp: 97.6 F (36.4 C) (03/13 0358) Temp Source: Oral (03/13 0358) BP: 137/61 (03/13 0358) Pulse Rate: 65 (03/13 0358)  Labs: Recent Labs    08/08/17 0903 08/08/17 0929 08/08/17 1648 08/08/17 1804 08/08/17 2334 08/09/17 0534 08/10/17 0530  HGB 15.2*  --   --   --   --   --  14.8  HCT 45.1  --   --   --   --   --  43.9  PLT 157  --   --   --   --   --  178  LABPROT  --   --  25.7*  --   --  30.4* 31.4*  INR  --   --  2.37  --   --  2.94 3.06  CREATININE  --  0.96  --   --   --  0.88 0.88  CKTOTAL  --  54  --   --   --   --   --   TROPONINI  --   --   --  0.15* 0.20* 0.17*  --    Estimated Creatinine Clearance: 35.8 mL/min (by C-G formula based on SCr of 0.88 mg/dL).  Medical History: Past Medical History:  Diagnosis Date  . AF (atrial fibrillation) (HCC)   . CHF NYHA class III (HCC)   . Hyperlipidemia   . Long term (current) use of anticoagulants   . Myxomatous mitral valve    post mitral valve replacement 2006  . Other primary cardiomyopathies    Medications:  PTA Warfarin: Coumadin 2.5mg  daily except 5mg  on Monday & Friday  Assessment: 84 yo F admitted with HF exacerbation on chronic Coumadin for Afib.  She is from [facility]- per [facility] clinic records she has been therapeutic on warfarin dose for several months.   CBC WNL, no bleeding noted today.  INR therapeutic on admission (2.37)  Today, 08/10/2017  INR slight elevated at 3.06 after 1 mg dose yesterday, received 5 mg the day before  Regular diet, eating breakfast  Goal of Therapy:  INR 2-3   Plan:   Warfarin 1mg  today at 1800  Daily INR  Monitor for signs and symptoms of  bleeding    Adalberto Cole, PharmD, BCPS Pager [phone] 08/10/2017 9:38 AM

## 2017-08-10 NOTE — Progress Notes (Signed)
[facility] PROGRESS NOTE    Progress Note  Mary Arellano  WPY:[id number] DOB: 1927-04-11 DOA: 08/08/2017 PCP: [name], NP     Brief Narrative:   Mary Arellano is an 84 y.o. female past medical history of systolic heart failure with an EF of 15% in 2008, chronic atrial fibrillation on Coumadin, advanced dementia comes into the hospital complaining of shortness of breath.  Assessment/Plan:   Acute systolic CHF (congestive heart failure) (HCC) Requiring oxygen on admission, chest x-ray showed bilateral pleural effusion, 2D echo in 2015 showed an EF of 15% repeat a 2D echo on 08/09/2017 showing ejection fraction of 45%, with elevated pulmonary pressures.. Cardiology was consulted, she also has an AICD which cardiology recommended not to change to generator due to her advanced age Follow strict I's and O's Daily weights. Going through the chart this is the lowest estimated dry weight she is been since 2018. Cardiology recommended IV Lasix  Elevated cardiac biomarkers: In the setting of acute decompensated systolic heart failure likely demand ischemia.  Hypokalemia Replete.  Dementia without behavioral disturbance Without behavioral disturbances, after called daughter no successful.   DVT prophylaxis: lovenox Family Communication:none Disposition Plan/Barrier to D/C: home in 2-3 days Code Status:     Code Status Orders  (From admission, onward)        Start     Ordered   08/08/17 1748  Do not attempt resuscitation (DNR)  Continuous    Question Answer Comment  In the event of cardiac or respiratory ARREST Do not call a "code blue"   In the event of cardiac or respiratory ARREST Do not perform Intubation, CPR, defibrillation or ACLS   In the event of cardiac or respiratory ARREST Use medication by any route, position, wound care, and other measures to relive pain and suffering. May use oxygen, suction and manual treatment of airway obstruction as  needed for comfort.      08/08/17 1747    Code Status History    Date Active Date Inactive Code Status Order ID Comments User Context   This patient has a current code status but no historical code status.    Advance Directive Documentation     Most Recent Value  Type of Advance Directive  Living will  Pre-existing out of facility DNR order (yellow form or pink MOST form)  No data  "MOST" Form in Place?  No data        IV Access:    Peripheral IV   Procedures and diagnostic studies:   Dg Chest 2 View  Result Date: 08/08/2017 CLINICAL DATA:  Dyspnea EXAM: CHEST - 2 VIEW COMPARISON:  06/03/2016 FINDINGS: Cardiac enlargement. Mitral valve replacement. AICD in satisfactory position and unchanged Cardiac enlargement with vascular congestion. Bilateral pleural effusions and bibasilar atelectasis. IMPRESSION: Congestive heart failure with bilateral effusions and bibasilar atelectasis. Electronically Signed   By: Marlan Palau M.D.   On: 08/08/2017 13:56   Ct Head Wo Contrast  Result Date: 08/09/2017 CLINICAL DATA:  Fall while taking anticoagulants. Chronic congestive heart failure and atrial fibrillation. EXAM: CT HEAD WITHOUT CONTRAST TECHNIQUE: Contiguous axial images were obtained from the base of the skull through the vertex without intravenous contrast. COMPARISON:  CT head without contrast 04/15/2008 FINDINGS: Brain: Atrophy and white matter disease has advanced significantly since the prior exam. No acute cortical infarct, hemorrhage, or mass lesion is present. The ventricles are proportionate to the degree of atrophy and volume loss. Basal ganglia are intact. Insular ribbon is normal.  No significant extra-axial hemorrhage is present. No parenchymal or ventricular hemorrhage is present. Vascular: Atherosclerotic calcifications are present within the cavernous internal carotid arteries bilaterally without a hyperdense vessel. Skull: Calvarium is intact. No focal lytic or blastic  lesions are present. Sinuses/Orbits: Posterior left ethmoid air cells are opacified. The remaining paranasal sinuses and mastoid air cells are clear. Bilateral lens replacements are present. Globes and orbits are otherwise unremarkable. IMPRESSION: 1. Progressive atrophy and white matter disease likely reflects the sequela of chronic microvascular ischemia. 2. No acute or focal cortical infarct or hemorrhage. 3. Posterior left ethmoid sinus disease. Electronically Signed   By: Marin Roberts M.D.   On: 08/09/2017 12:14     Medical Consultants:    None.  Anti-Infectives:   None  Subjective:    Mary Arellano patient is minimally verbal, able to follow commands  Objective:    Vitals:   08/09/17 0517 08/09/17 1230 08/09/17 2155 08/10/17 0358  BP: (!) 142/98 (!) 143/71 (!) 145/87 137/61  Pulse: 73 71 85 65  Resp: (!) 22 20 20 18   Temp:  (!) 97.3 F (36.3 C) 97.9 F (36.6 C) 97.6 F (36.4 C)  TempSrc: Oral Oral Oral Oral  SpO2: 95% 94% 92% 96%  Weight:      Height:        Intake/Output Summary (Last 24 hours) at 08/10/2017 0949 Last data filed at 08/10/2017 0700 Gross per 24 hour  Intake 120 ml  Output 900 ml  Net -780 ml   Filed Weights   08/08/17 2002  Weight: 51.3 kg (113 lb 1.5 oz)    Exam: General exam: In no acute distress, cachectic appearing Respiratory system: Good air movement with crackles on the right lower lobe. Cardiovascular system: S1 & S2 heard, RRR Gastrointestinal system: Abdomen is nondistended, soft and nontender.  Central nervous system: Alert and oriented. No focal neurological deficits. Extremities: No lower extremity edema. Skin: No rashes, lesions or ulcers Psychiatry: Able to follow commands poor insight of medical condition.   Data Reviewed:    Labs: Basic Metabolic Panel: Recent Labs  Lab 08/08/17 0929 08/09/17 0534 08/10/17 0530  NA 141 137 140  K 3.5 3.2* 3.7  CL 99* 96* 99*  CO2 31 29 29   GLUCOSE 118* 126*  113*  BUN 17 17 25*  CREATININE 0.96 0.88 0.88  CALCIUM 9.4 9.0 9.4  MG  --   --  1.8   GFR Estimated Creatinine Clearance: 35.8 mL/min (by C-G formula based on SCr of 0.88 mg/dL). Liver Function Tests: Recent Labs  Lab 08/08/17 0929 08/10/17 0530  AST 36 39  ALT 31 27  ALKPHOS 65 66  BILITOT 1.7* 1.8*  PROT 6.8 6.5  ALBUMIN 3.5 3.1*   No results for input(s): LIPASE, AMYLASE in the last 168 hours. No results for input(s): AMMONIA in the last 168 hours. Coagulation profile Recent Labs  Lab 08/08/17 1648 08/09/17 0534 08/10/17 0530  INR 2.37 2.94 3.06    CBC: Recent Labs  Lab 08/08/17 0903 08/10/17 0530  WBC 9.2 8.5  NEUTROABS 7.5 5.7  HGB 15.2* 14.8  HCT 45.1 43.9  MCV 91.7 92.6  PLT 157 178   Cardiac Enzymes: Recent Labs  Lab 08/08/17 0929 08/08/17 1804 08/08/17 2334 08/09/17 0534  CKTOTAL 54  --   --   --   TROPONINI  --  0.15* 0.20* 0.17*   BNP (last 3 results) No results for input(s): PROBNP in the last 8760 hours. CBG: Recent Labs  Lab 08/08/17 0851  GLUCAP 113*   D-Dimer: No results for input(s): DDIMER in the last 72 hours. Hgb A1c: No results for input(s): HGBA1C in the last 72 hours. Lipid Profile: No results for input(s): CHOL, HDL, LDLCALC, TRIG, CHOLHDL, LDLDIRECT in the last 72 hours. Thyroid function studies: Recent Labs    08/08/17 1804  TSH 1.879   Anemia work up: No results for input(s): VITAMINB12, FOLATE, FERRITIN, TIBC, IRON, RETICCTPCT in the last 72 hours. Sepsis Labs: Recent Labs  Lab 08/08/17 0903 08/10/17 0530  WBC 9.2 8.5   Microbiology Recent Results (from the past 240 hour(s))  MRSA PCR Screening     Status: Abnormal   Collection Time: 08/08/17  8:42 PM  Result Value Ref Range Status   MRSA by PCR POSITIVE (A) NEGATIVE Final    Comment:        The GeneXpert MRSA Assay (FDA approved for NASAL specimens only), is one component of a comprehensive MRSA colonization surveillance program. It is  not intended to diagnose MRSA infection nor to guide or monitor treatment for MRSA infections. RESULT CALLED TO, READ BACK BY AND VERIFIED WITH: M.STEFFENS RN 08/08/2017 2257 JR Performed at [facility] [address] [place], [place] [address]      Medications:   . Chlorhexidine Gluconate Cloth  6 each Topical Q0600  . digoxin  0.125 mg Oral QODAY  . furosemide  40 mg Intravenous Q12H  . levothyroxine  25 mcg Oral QAC breakfast  . metoprolol tartrate  50 mg Oral BID  . mupirocin ointment  1 application Nasal BID  . nitrofurantoin  50 mg Oral Daily  . warfarin  1 mg Oral ONCE-1800  . Warfarin - Pharmacist Dosing Inpatient   Does not apply q1800   Continuous Infusions:    LOS: 2 days   Marinda Elk  [facility] Pager [phone]  *Please refer to [email or web address], password [email or web address] to get updated schedule on who will round on this patient, as hospitalists switch teams weekly. If 7PM-7AM, please contact night-coverage at [email or web address], password [email or web address] for any overnight needs.  08/10/2017, 9:49 AM

## 2017-08-10 NOTE — Care Management Note (Signed)
Case Management Note  Patient Details  Name: CARISSA DUGO MRN: [id number] Date of Birth: 23-Mar-1927  Subjective/Objective:  84 y/o f admitted w/CHF. DNR. From ALF-[facility]. Palliative cons-FTT,memory decline. CSW following.                  Action/Plan:d/c SNF.   Expected Discharge Date:  (UNKNOWN)               Expected Discharge Plan:  Skilled Nursing Facility  In-House Referral:  Clinical Social Work  Discharge planning Services  CM Consult  Post Acute Care Choice:    Choice offered to:     DME Arranged:    DME Agency:     HH Arranged:    HH Agency:     Status of Service:  In process, will continue to follow  If discussed at Long Length of Stay Meetings, dates discussed:    Additional Comments:  Lanier Clam, RN 08/10/2017, 11:39 AM

## 2017-08-10 NOTE — Evaluation (Addendum)
Physical Therapy Evaluation Patient Details Name: Mary Arellano MRN: [id number] DOB: 08-07-1926 Today's Date: 08/10/2017   History of Present Illness  84 yo female admitted with CHF, elevated troponin. Hx of CHF, A fib. Pt is from an ALF  Clinical Impression  On eval, pt required Min assist for mobility. She was able to stand and take a few side steps along side of bed with a RW. Pt was very drowsy during session. She was not agreeable to walk at time of eval. Will follow and progress activity as tolerated.     Follow Up Recommendations SNF (unless family chooses for pt to return to ALF)  Equipment Recommendations  None recommended by PT    Recommendations for Other Services       Precautions / Restrictions Precautions Precautions: Fall Restrictions Weight Bearing Restrictions: No      Mobility  Bed Mobility Overal bed mobility: Needs Assistance Bed Mobility: Supine to Sit;Sit to Sidelying     Supine to sit: Min assist;HOB elevated   Sit to sidelying: Min assist General bed mobility comments: Assist for trunk and LEs. Increased time. Multimodal cueing required-possibly due to drowsiness.   Transfers Overall transfer level: Needs assistance Equipment used: Rolling walker (2 wheeled) Transfers: Sit to/from Stand Sit to Stand: Min assist         General transfer comment: Assist to rise, stabilize, control descent. VCS safety, hand placement. Increased time.   Ambulation/Gait Ambulation/Gait assistance: Min assist   Assistive device: Rolling walker (2 wheeled)       General Gait Details: side steps along side of bed with a RW. Assist to steady and maneuver with walker  Stairs            Wheelchair Mobility    Modified Rankin (Stroke Patients Only)       Balance Overall balance assessment: Needs assistance         Standing balance support: Bilateral upper extremity supported Standing balance-Leahy Scale: Poor Standing balance comment:  uses rW                             Pertinent Vitals/Pain Pain Assessment: No/denies pain    Home Living Family/patient expects to be discharged to:: Assisted living               Home Equipment: Walker - 2 wheels      Prior Function Level of Independence: Needs assistance   Gait / Transfers Assistance Needed: per chart and pt, she uses a walker     Comments: no family present to provide PLOF     Hand Dominance        Extremity/Trunk Assessment   Upper Extremity Assessment Upper Extremity Assessment: Generalized weakness    Lower Extremity Assessment Lower Extremity Assessment: Generalized weakness    Cervical / Trunk Assessment Cervical / Trunk Assessment: Kyphotic  Communication   Communication: No difficulties  Cognition Arousal/Alertness: Awake/alert(but very drowsy) Behavior During Therapy: WFL for tasks assessed/performed Overall Cognitive Status: Difficult to assess                                        General Comments      Exercises     Assessment/Plan    PT Assessment Patient needs continued PT services  PT Problem List Decreased strength;Decreased balance;Decreased mobility;Decreased activity tolerance;Decreased cognition;Decreased knowledge of use of  DME       PT Treatment Interventions DME instruction;Gait training;Functional mobility training;Therapeutic activities;Balance training;Patient/family education;Therapeutic exercise    PT Goals (Current goals can be found in the Care Plan section)  Acute Rehab PT Goals Patient Stated Goal: none stated PT Goal Formulation: With patient Time For Goal Achievement: 08/24/17 Potential to Achieve Goals: Fair    Frequency Min 3X/week   Barriers to discharge        Co-evaluation               AM-PAC PT "6 Clicks" Daily Activity  Outcome Measure Difficulty turning over in bed (including adjusting bedclothes, sheets and blankets)?: A Lot Difficulty  moving from lying on back to sitting on the side of the bed? : Unable Difficulty sitting down on and standing up from a chair with arms (e.g., wheelchair, bedside commode, etc,.)?: Unable Help needed moving to and from a bed to chair (including a wheelchair)?: A Little Help needed walking in hospital room?: A Little Help needed climbing 3-5 steps with a railing? : A Lot 6 Click Score: 12    End of Session   Activity Tolerance: (limited by drowsiness) Patient left: in bed;with call bell/phone within reach;with bed alarm set   PT Visit Diagnosis: Muscle weakness (generalized) (M62.81);Difficulty in walking, not elsewhere classified (R26.2)    Time: 1916-6060 PT Time Calculation (min) (ACUTE ONLY): 17 min   Charges:   PT Evaluation $PT Eval Moderate Complexity: 1 Mod     PT G Codes:          Rebeca Alert, MPT Pager: [phone]

## 2017-08-10 NOTE — Progress Notes (Addendum)
Progress Note  Patient Name: Mary Arellano Date of Encounter: 08/10/2017  Primary Cardiologist: Tonny Bollman, MD   Subjective   Up in bed eating lunch. She still looks weak but indicates she is feeling a little better.  Inpatient Medications    Scheduled Meds: . Chlorhexidine Gluconate Cloth  6 each Topical Q0600  . digoxin  0.125 mg Oral QODAY  . furosemide  40 mg Intravenous Q12H  . levothyroxine  25 mcg Oral QAC breakfast  . metoprolol tartrate  50 mg Oral BID  . mupirocin ointment  1 application Nasal BID  . nitrofurantoin  50 mg Oral Daily  . polyethylene glycol  17 g Oral Daily  . warfarin  1 mg Oral ONCE-1800  . Warfarin - Pharmacist Dosing Inpatient   Does not apply q1800   Continuous Infusions:  PRN Meds: acetaminophen, ondansetron **OR** ondansetron (ZOFRAN) IV, sodium chloride, zolpidem   Vital Signs    Vitals:   08/09/17 1230 08/09/17 2155 08/10/17 0358 08/10/17 0957  BP: (!) 143/71 (!) 145/87 137/61 135/64  Pulse: 71 85 65   Resp: 20 20 18    Temp: (!) 97.3 F (36.3 C) 97.9 F (36.6 C) 97.6 F (36.4 C)   TempSrc: Oral Oral Oral   SpO2: 94% 92% 96%   Weight:      Height:        Intake/Output Summary (Last 24 hours) at 08/10/2017 1234 Last data filed at 08/10/2017 0700 Gross per 24 hour  Intake 120 ml  Output 600 ml  Net -480 ml   Filed Weights   08/08/17 2002  Weight: 113 lb 1.5 oz (51.3 kg)    Telemetry    AF- PVCs- Personally Reviewed  ECG      Physical Exam   GEN: cachectic, frail, NAD  Neck: No JVD Cardiac: irregularly irregular Respiratory: decreased breath sounds bilateraly GI: Soft, nontender, non-distended  MS: No edema; No deformity. Neuro:  Nonfocal  Psych: Normal affect   Labs    Chemistry Recent Labs  Lab 08/08/17 0929 08/09/17 0534 08/10/17 0530  NA 141 137 140  K 3.5 3.2* 3.7  CL 99* 96* 99*  CO2 31 29 29   GLUCOSE 118* 126* 113*  BUN 17 17 25*  CREATININE 0.96 0.88 0.88  CALCIUM 9.4 9.0 9.4   PROT 6.8  --  6.5  ALBUMIN 3.5  --  3.1*  AST 36  --  39  ALT 31  --  27  ALKPHOS 65  --  66  BILITOT 1.7*  --  1.8*  GFRNONAA 51* 57* 57*  GFRAA 59* >60 >60  ANIONGAP 11 12 12      Hematology Recent Labs  Lab 08/08/17 0903 08/10/17 0530  WBC 9.2 8.5  RBC 4.92 4.74  HGB 15.2* 14.8  HCT 45.1 43.9  MCV 91.7 92.6  MCH 30.9 31.2  MCHC 33.7 33.7  RDW 14.2 14.3  PLT 157 178    Cardiac Enzymes Recent Labs  Lab 08/08/17 1804 08/08/17 2334 08/09/17 0534  TROPONINI 0.15* 0.20* 0.17*   No results for input(s): TROPIPOC in the last 168 hours.   BNP Recent Labs  Lab 08/08/17 0902  BNP 242.6*     DDimer No results for input(s): DDIMER in the last 168 hours.   Radiology    Dg Chest 2 View  Result Date: 08/08/2017 CLINICAL DATA:  Dyspnea EXAM: CHEST - 2 VIEW COMPARISON:  06/03/2016 FINDINGS: Cardiac enlargement. Mitral valve replacement. AICD in satisfactory position and unchanged Cardiac enlargement  with vascular congestion. Bilateral pleural effusions and bibasilar atelectasis. IMPRESSION: Congestive heart failure with bilateral effusions and bibasilar atelectasis. Electronically Signed   By: Marlan Palau M.D.   On: 08/08/2017 13:56   Ct Head Wo Contrast  Result Date: 08/09/2017 CLINICAL DATA:  Fall while taking anticoagulants. Chronic congestive heart failure and atrial fibrillation. EXAM: CT HEAD WITHOUT CONTRAST TECHNIQUE: Contiguous axial images were obtained from the base of the skull through the vertex without intravenous contrast. COMPARISON:  CT head without contrast 04/15/2008 FINDINGS: Brain: Atrophy and white matter disease has advanced significantly since the prior exam. No acute cortical infarct, hemorrhage, or mass lesion is present. The ventricles are proportionate to the degree of atrophy and volume loss. Basal ganglia are intact. Insular ribbon is normal. No significant extra-axial hemorrhage is present. No parenchymal or ventricular hemorrhage is present.  Vascular: Atherosclerotic calcifications are present within the cavernous internal carotid arteries bilaterally without a hyperdense vessel. Skull: Calvarium is intact. No focal lytic or blastic lesions are present. Sinuses/Orbits: Posterior left ethmoid air cells are opacified. The remaining paranasal sinuses and mastoid air cells are clear. Bilateral lens replacements are present. Globes and orbits are otherwise unremarkable. IMPRESSION: 1. Progressive atrophy and white matter disease likely reflects the sequela of chronic microvascular ischemia. 2. No acute or focal cortical infarct or hemorrhage. 3. Posterior left ethmoid sinus disease. Electronically Signed   By: Marin Roberts M.D.   On: 08/09/2017 12:14    Cardiac Studies   Echo 08/09/17- Study Conclusions  - Left ventricle: The cavity size was normal. There was mild focal   basal hypertrophy of the septum. Systolic function was mildly   reduced. The estimated ejection fraction was in the range of 45%   to 50%. Wall motion was normal; there were no regional wall   motion abnormalities. The study is not technically sufficient to   allow evaluation of LV diastolic function. - Ventricular septum: Septal motion showed abnormal function and   dyssynergy. The contour showed diastolic flattening. - Aortic valve: Trileaflet; mildly thickened, mildly calcified   leaflets. There was trivial regurgitation. - Mitral valve: A bioprosthesis was present. The prosthesis had a   normal range of motion. The findings are consistent with severe   stenosis. Mean gradient (D): 13 mm Hg. Valve area by pressure   half-time: 0.58 cm^2. Valve area by continuity equation (using   LVOT flow): 0.19 cm^2. - Left atrium: The atrium was severely dilated. - Right ventricle: Pacer wire or catheter noted in right ventricle. - Right atrium: The atrium was moderately dilated. - Tricuspid valve: There was severe regurgitation. - Pulmonic valve: There was severe  regurgitation. - Pulmonary arteries: Systolic pressure was moderately increased.   PA peak pressure: 51 mm Hg (S). - Pericardium, extracardiac: There was a left pleural effusion.   Patient Profile     84 y.o. female with a hx of chronic systolic CHF, NICM, CAF, mild dementia, and s/p tissue MVR in 2006 who was admitted 08/08/17 for the evaluation of CHF  Assessment & Plan    Acute on chronic CHF She is getting IV Lasix. I/O negative 1L  NICM EF in 2008 was 15%- echo this admission shows her EF to be 45-50%  S/P tissue MVR Surgery 2006, now with severe MS by echo.   SCD Guidant ICD implanted 2008. The decision has been made not to replace this  CAF Overall rate controlled, frequent PVCs  Coumadin Rx INR goal 2-3  Dementia She has developed some short  term memory problems in the past months.  Plan: see Dr Trygg Mantz's recommendation 08/09/17- decrease Lasix tomorrow to her home dose of 40 mg daily.    For questions or updates, please contact [facility] Please consult [email or web address] for contact info under Cardiology/STEMI.      Signed, Corine Shelter, PA-C  08/10/2017, 12:34 PM    History and all data above reviewed.  Patient examined.  I agree with the findings as above.  She denies pain.  She does not report overt SOB.  She is obviously confused.  The patient exam reveals COR:RRR, systolic murmur  ,  Lungs: Clear  ,  Abd: Positive bowel sounds, no rebound no guarding, Ext No edema  .  All available labs, radiology testing, previous records reviewed. Agree with documented assessment and plan. Dyspnea:  Reasonable diuresis.  Agree with changing to PO Lasix in the AM.  No further cardiac work up at this point.  Needs conservative management of her MV stenosis.    Fayrene Fearing Prisilla Kocsis  2:33 PM  08/10/2017

## 2017-08-10 NOTE — Progress Notes (Signed)
Nutrition Brief Note  RD consulted for Congestive Heart Failure Education.   Pt has dementia. RD noted that palliative care has been consulted for "goals of care, progressive decline not a candidate for further cardiac procedure, progressive memory decline,and  increased falls." Pt not appropriate for nutrition education.   Vanessa Kick RD, LDN Clinical Nutrition Pager # [phone]

## 2017-08-10 NOTE — Clinical Social Work Note (Signed)
Clinical Social Work Assessment  Patient Details  Name: Mary Arellano MRN: [id number] Date of Birth: 02/03/1927  Date of referral:  08/10/17               Reason for consult:  Discharge Planning                Permission sought to share information with:    Permission granted to share information::     Name::        Agency::     Relationship::     Contact Information:     Housing/Transportation Living arrangements for the past 2 months:  Assisted Living Facility([facility]) Source of Information:  Adult Children(Daughter - Devonne Doughty [phone]) Patient Interpreter Needed:  None Criminal Activity/Legal Involvement Pertinent to Current Situation/Hospitalization:  No - Comment as needed Significant Relationships:  Adult Children Lives with:  Facility Resident Do you feel safe going back to the place where you live?  (PT recommending SNF) Need for family participation in patient care:  Yes (Comment)  Care giving concerns:  Patient from [facility]. Patient's daughter reported that patient has been at ALF for the pst 9 years. Patient's daughter reported that patient uses a walker at [facility] and needs assistance with ADLs. PT recommending SNF.   Social Worker assessment / plan:  CSW spoke with patient's daughter regarding PT recommendation for SNF and discharge plans. Patient's daughter reported that she is currently out of town and is uncertain about patient's discharge plans. Patient's daughter reported that she is trying to come back in town tomorrow and wants to see patient. Patient's daughter reported that patient loves her ALF and is unsure if patient needs to return or go to SNF. CSW agreed to follow up with patient's ALF about facility's ability to meet patient's care needs.  CSW contacted ALF community outreach specialist [facility]) regarding patient, no answer. CSW left voicemail requesting return phone call.  CSW received return call from [facility]  ALF community outreach specialist who agreed to come visit patient tomorrow.   CSW will continue to follow and assist with discharge planning.   Employment status:  Retired Database administrator PT Recommendations:  Skilled Nursing Facility Information / Referral to community resources:  Other (Comment Required)(Patient from ALF )  Patient/Family's Response to care:  Patient's daughter appreciative of CSW assistance with discharge planning.  Patient/Family's Understanding of and Emotional Response to Diagnosis, Current Treatment, and Prognosis:  Patient oriented to self and place not able to participate in assessment. Patient's daughter verbalized uncertainty about patient's discharge plan and reported that she wanted to see patient prior to making plans. Patient's daughter reported that she was out of town and planned to come back into town tomorrow.   Emotional Assessment Appearance:    Attitude/Demeanor/Rapport:  Unable to Assess Affect (typically observed):  Unable to Assess Orientation:  Oriented to Self, Oriented to Place Alcohol / Substance use:  Not Applicable Psych involvement (Current and /or in the community):  No (Comment)  Discharge Needs  Concerns to be addressed:  Care Coordination Readmission within the last 30 days:  No Current discharge risk:  Physical Impairment Barriers to Discharge:  Continued Medical Work up   [employer], LCSW 08/10/2017, 3:10 PM

## 2017-08-11 DIAGNOSIS — Z515 Encounter for palliative care: Secondary | ICD-10-CM

## 2017-08-11 DIAGNOSIS — W19XXXA Unspecified fall, initial encounter: Secondary | ICD-10-CM

## 2017-08-11 DIAGNOSIS — I5023 Acute on chronic systolic (congestive) heart failure: Principal | ICD-10-CM

## 2017-08-11 DIAGNOSIS — F0390 Unspecified dementia without behavioral disturbance: Secondary | ICD-10-CM

## 2017-08-11 DIAGNOSIS — Z7189 Other specified counseling: Secondary | ICD-10-CM

## 2017-08-11 DIAGNOSIS — I050 Rheumatic mitral stenosis: Secondary | ICD-10-CM

## 2017-08-11 LAB — BASIC METABOLIC PANEL
Anion gap: 13 (ref 5–15)
BUN: 29 mg/dL — ABNORMAL HIGH (ref 6–20)
CO2: 32 mmol/L (ref 22–32)
Calcium: 9.6 mg/dL (ref 8.9–10.3)
Chloride: 97 mmol/L — ABNORMAL LOW (ref 101–111)
Creatinine, Ser: 0.98 mg/dL (ref 0.44–1.00)
GFR calc Af Amer: 58 mL/min — ABNORMAL LOW (ref >60)
GFR calc non Af Amer: 50 mL/min — ABNORMAL LOW (ref >60)
Glucose, Bld: 164 mg/dL — ABNORMAL HIGH (ref 65–99)
Potassium: 3.7 mmol/L (ref 3.5–5.1)
Sodium: 142 mmol/L (ref 135–145)

## 2017-08-11 LAB — PROTIME-INR
INR: 2.28
PROTHROMBIN TIME: 24.9 s — AB (ref 11.4–15.2)

## 2017-08-11 MED ORDER — WARFARIN SODIUM 2.5 MG PO TABS
2.5000 mg | ORAL_TABLET | Freq: Once | ORAL | Status: AC
  Administered 2017-08-11: 2.5 mg via ORAL
  Filled 2017-08-11: qty 1

## 2017-08-11 NOTE — Discharge Summary (Signed)
Physician Discharge Summary  Mary Arellano ZOX:[id number] DOB: July 06, 1926 DOA: 08/08/2017  PCP: [name], NP  Admit date: 08/08/2017 Discharge date: 08/11/2017  Admitted From: Memory care unit  Disposition: Memory care unit   Recommendations for Outpatient Follow-up:  1. Follow up with PCP in 1-2 weeks. 2. We will need to consult hospice and palliative care to address with family end of life and long-term goals.  Home Health:No Equipment/Devices:none  Discharge Condition:Stable CODE STATUS:DNR Diet recommendation: Heart Healthy  Brief/Interim Summary: 84 y.o. female past medical history of systolic heart failure with an EF of 15% in 2008, chronic atrial fibrillation on Coumadin, advanced dementia comes into the hospital complaining of shortness of breath.  Discharge Diagnoses:  Active Problems:   Acute systolic CHF (congestive heart failure) (HCC)   Hypokalemia   Dementia without behavioral disturbance  Acute systolic heart failure: On admission chest x-ray showed bilateral pleural effusion 2D echo done that showed an improved ejection fraction to 45% with elevated pulmonary pressures. Cardiology was consulted who agree with IV diuresis she diuresis well. She was changed to oral Lasix 20 mg. Hospice and palliative care to be consulted a facility to discuss end of life.  Elevated cardiac biomarkers: In the setting of acute decompensated systolic heart failure likely due to demand ischemia.  Hyperkalemia: It was repleted orally.  Dementia without behavioral disturbances: I try to call her daughter and her son several times with no answer. We would have to discuss with family goals of care at the facility. No changes were made to her medication.  Discharge Instructions  Discharge Instructions    Diet - low sodium heart healthy   Complete by:  As directed    Increase activity slowly   Complete by:  As directed      Allergies as of 08/11/2017      Reactions    Simvastatin    REACTION: liver function elevation      Medication List    STOP taking these medications   amoxicillin 500 MG tablet Commonly known as:  AMOXIL   nitrofurantoin 50 MG capsule Commonly known as:  MACRODANTIN     TAKE these medications   acetaminophen 500 MG tablet Commonly known as:  TYLENOL Take 1,000 mg by mouth every 6 (six) hours as needed for mild pain or fever.   benzonatate 100 MG capsule Commonly known as:  TESSALON Take 1 capsule (100 mg total) by mouth 2 (two) times daily as needed for cough.   CERTAVITE/ANTIOXIDANTS PO Take 1 tablet by mouth daily.   digoxin 0.125 MG tablet Commonly known as:  LANOXIN Take 0.125 mg by mouth every other day. Hold if pulse is less than 60.   ENSURE Take 237 mLs by mouth daily.   furosemide 40 MG tablet Commonly known as:  LASIX Take 40 mg by mouth daily.   GNP COUGH DM ER 30 MG/5ML liquid Generic drug:  dextromethorphan Take 30 mg by mouth 2 (two) times daily as needed for cough.   levothyroxine 25 MCG tablet Commonly known as:  SYNTHROID, LEVOTHROID Take 25 mcg by mouth daily.   metoprolol tartrate 50 MG tablet Commonly known as:  LOPRESSOR Take 1 tablet (50 mg total) by mouth daily.   sodium chloride 0.65 % nasal spray Commonly known as:  OCEAN Place 1 spray into the nose as needed for congestion (Keeps at the bedside.).   warfarin 2.5 MG tablet Commonly known as:  COUMADIN Take as directed. If you are unsure how to take  this medication, talk to your nurse or doctor. Original instructions:  Take 2.5 mg by mouth See admin instructions. Takes 2.5mg  on Sunday, Tuesday, Wednesday, Thursday, and Saturday.   warfarin 5 MG tablet Commonly known as:  COUMADIN Take as directed. If you are unsure how to take this medication, talk to your nurse or doctor. Original instructions:  Take 5 mg by mouth See admin instructions. Takes 5mg on Monday and Friday.            Durable Medical Equipment  (From  admission, onward)        Start     Ordered   08/10/17 1212  For home use only DME oxygen  Once    Question Answer Comment  Mode or (Route) Nasal cannula   Liters per Minute 2   Frequency Continuous (stationary and portable oxygen unit needed)   Oxygen conserving device Yes   Oxygen delivery system Gas      03 /13/19 1211      Allergies  Allergen Reactions  . Simvastatin     REACTION: liver function elevation    Consultations:  Cardiology   Procedures/Studies: Dg Chest 2 View  Result Date: 08/08/2017 CLINICAL DATA:  Dyspnea EXAM: CHEST - 2 VIEW COMPARISON:  06/03/2016 FINDINGS: Cardiac enlargement. Mitral valve replacement. AICD in satisfactory position and unchanged Cardiac enlargement with vascular congestion. Bilateral pleural effusions and bibasilar atelectasis. IMPRESSION: Congestive heart failure with bilateral effusions and bibasilar atelectasis. Electronically Signed   By: Marlan Palau M.D.   On: 08/08/2017 13:56   Ct Head Wo Contrast  Result Date: 08/09/2017 CLINICAL DATA:  Fall while taking anticoagulants. Chronic congestive heart failure and atrial fibrillation. EXAM: CT HEAD WITHOUT CONTRAST TECHNIQUE: Contiguous axial images were obtained from the base of the skull through the vertex without intravenous contrast. COMPARISON:  CT head without contrast 04/15/2008 FINDINGS: Brain: Atrophy and white matter disease has advanced significantly since the prior exam. No acute cortical infarct, hemorrhage, or mass lesion is present. The ventricles are proportionate to the degree of atrophy and volume loss. Basal ganglia are intact. Insular ribbon is normal. No significant extra-axial hemorrhage is present. No parenchymal or ventricular hemorrhage is present. Vascular: Atherosclerotic calcifications are present within the cavernous internal carotid arteries bilaterally without a hyperdense vessel. Skull: Calvarium is intact. No focal lytic or blastic lesions are present.  Sinuses/Orbits: Posterior left ethmoid air cells are opacified. The remaining paranasal sinuses and mastoid air cells are clear. Bilateral lens replacements are present. Globes and orbits are otherwise unremarkable. IMPRESSION: 1. Progressive atrophy and white matter disease likely reflects the sequela of chronic microvascular ischemia. 2. No acute or focal cortical infarct or hemorrhage. 3. Posterior left ethmoid sinus disease. Electronically Signed   By: Marin Roberts M.D.   On: 08/09/2017 12:14    2D echo that showed an ejection fraction of 45%.   Subjective: No complaints.  Discharge Exam: Vitals:   08/10/17 2247 08/11/17 0459  BP: (!) 145/63 138/72  Pulse: 67 64  Resp:  20  Temp:  98.5 F (36.9 C)  SpO2:  96%   Vitals:   08/10/17 1246 08/10/17 2041 08/10/17 2247 08/11/17 0459  BP: 133/61 (!) 121/54 (!) 145/63 138/72  Pulse: 76 74 67 64  Resp: 19 20  20   Temp: 98.2 F (36.8 C) 98.4 F (36.9 C)  98.5 F (36.9 C)  TempSrc: Oral Oral  Oral  SpO2: 94% 99%  96%  Weight:      Height:  General: Pt is alert, awake, not in acute distress Cardiovascular: RRR, S1/S2 +, no rubs, no gallops Respiratory: CTA bilaterally, no wheezing, no rhonchi Abdominal: Soft, NT, ND, bowel sounds + Extremities: no edema, no cyanosis    The results of significant diagnostics from this hospitalization (including imaging, microbiology, ancillary and laboratory) are listed below for reference.     Microbiology: Recent Results (from the past 240 hour(s))  MRSA PCR Screening     Status: Abnormal   Collection Time: 08/08/17  8:42 PM  Result Value Ref Range Status   MRSA by PCR POSITIVE (A) NEGATIVE Final    Comment:        The GeneXpert MRSA Assay (FDA approved for NASAL specimens only), is one component of a comprehensive MRSA colonization surveillance program. It is not intended to diagnose MRSA infection nor to guide or monitor treatment for MRSA infections. RESULT CALLED  TO, READ BACK BY AND VERIFIED WITH: M.STEFFENS RN 08/08/2017 2257 JR Performed at [facility], [address], [place], [place] [address]      Labs: BNP (last 3 results) Recent Labs    08/08/17 0902  BNP 242.6*   Basic Metabolic Panel: Recent Labs  Lab 08/08/17 0929 08/09/17 0534 08/10/17 0530 08/10/17 2343  NA 141 137 140 142  K 3.5 3.2* 3.7 3.7  CL 99* 96* 99* 97*  CO2 31 29 29  32  GLUCOSE 118* 126* 113* 164*  BUN 17 17 25* 29*  CREATININE 0.96 0.88 0.88 0.98  CALCIUM 9.4 9.0 9.4 9.6  MG  --   --  1.8  --    Liver Function Tests: Recent Labs  Lab 08/08/17 0929 08/10/17 0530  AST 36 39  ALT 31 27  ALKPHOS 65 66  BILITOT 1.7* 1.8*  PROT 6.8 6.5  ALBUMIN 3.5 3.1*   No results for input(s): LIPASE, AMYLASE in the last 168 hours. No results for input(s): AMMONIA in the last 168 hours. CBC: Recent Labs  Lab 08/08/17 0903 08/10/17 0530  WBC 9.2 8.5  NEUTROABS 7.5 5.7  HGB 15.2* 14.8  HCT 45.1 43.9  MCV 91.7 92.6  PLT 157 178   Cardiac Enzymes: Recent Labs  Lab 08/08/17 0929 08/08/17 1804 08/08/17 2334 08/09/17 0534  CKTOTAL 54  --   --   --   TROPONINI  --  0.15* 0.20* 0.17*   BNP: Invalid input(s): POCBNP CBG: Recent Labs  Lab 08/08/17 0851  GLUCAP 113*   D-Dimer No results for input(s): DDIMER in the last 72 hours. Hgb A1c No results for input(s): HGBA1C in the last 72 hours. Lipid Profile No results for input(s): CHOL, HDL, LDLCALC, TRIG, CHOLHDL, LDLDIRECT in the last 72 hours. Thyroid function studies Recent Labs    08/08/17 1804  TSH 1.879   Anemia work up No results for input(s): VITAMINB12, FOLATE, FERRITIN, TIBC, IRON, RETICCTPCT in the last 72 hours. Urinalysis    Component Value Date/Time   COLORURINE YELLOW 08/08/2017 1640   APPEARANCEUR CLEAR 08/08/2017 1640   LABSPEC 1.010 08/08/2017 1640   PHURINE 7.0 08/08/2017 1640   GLUCOSEU NEGATIVE 08/08/2017 1640   HGBUR NEGATIVE 08/08/2017 1640    HGBUR trace-lysed 03/25/2009 1532   BILIRUBINUR NEGATIVE 08/08/2017 1640   BILIRUBINUR n 06/07/2017 1509   KETONESUR 5 (A) 08/08/2017 1640   PROTEINUR NEGATIVE 08/08/2017 1640   UROBILINOGEN 0.2 06/07/2017 1509   UROBILINOGEN 1.0 05/30/2009 1304   NITRITE NEGATIVE 08/08/2017 1640   LEUKOCYTESUR NEGATIVE 08/08/2017 1640   Sepsis Labs Invalid  input(s): PROCALCITONIN,  WBC,  LACTICIDVEN Microbiology Recent Results (from the past 240 hour(s))  MRSA PCR Screening     Status: Abnormal   Collection Time: 08/08/17  8:42 PM  Result Value Ref Range Status   MRSA by PCR POSITIVE (A) NEGATIVE Final    Comment:        The GeneXpert MRSA Assay (FDA approved for NASAL specimens only), is one component of a comprehensive MRSA colonization surveillance program. It is not intended to diagnose MRSA infection nor to guide or monitor treatment for MRSA infections. RESULT CALLED TO, READ BACK BY AND VERIFIED WITH: M.STEFFENS RN 08/08/2017 2257 JR Performed at [facility], [address], [place], [place] [address]      Time coordinating discharge: Over 30 minutes  SIGNED:   Marinda Elk, MD  [facility] 08/11/2017, 10:10 AM Pager   If 7PM-7AM, please contact night-coverage [email or web address] Password [email or web address]

## 2017-08-11 NOTE — Care Management Important Message (Signed)
Important Message  Patient Details IM Letter given to Kathy/Case Manager to present to the Patient Name: SHARNI LEAGUE MRN: [id number] Date of Birth: 1926-09-09   Medicare Important Message Given:  Yes    Caren Macadam 08/11/2017, 11:24 AM

## 2017-08-11 NOTE — Consult Note (Signed)
Consultation Note Date: 08/11/2017   Patient Name: Mary Arellano  DOB: 1927/02/13  MRN: [id number]  Age / Sex: 84 y.o., female  PCP: Mary Frees, NP Referring Physician: David Arellano, Mary Engels, MD  Reason for Consultation: Establishing goals of care and Hospice Evaluation  HPI/Patient Profile: 84 y.o. female  with past medical history of CHF previous EF 15%, nonischemic cardiomyopathy, defibrillator, mitral valve replacement 2006, atrial fibrillation on coumadin, and dementia admitted on 08/08/2017 after fall from memory care unit. Chest xray reveals bilateral pleural effusions. Repeat ECHO on 3/12 showing EF of 45% with elevated pulmonary pressures with severe mitral stenosis. Cardiology following. Patient is not a candidate for further invasive evaluation or treatment. Receiving lasix. Elevated troponin likely due to acute decompensated systolic heart failure and demand ischemia. Baseline dementia. Palliative medicine consultation for goals of care/hospice evaluation.   Clinical Assessment and Goals of Care: I have reviewed medical records, discussed with care team, spoke with daughter Mary Arellano), and assessed patient at bedside. No family at bedside. Mary Arellano wakes to voice. She is alert and oriented to person and place. When asked how she is feeling she tells me "not good" but cannot describe how she is feeling. Denies pain or discomfort. Offered tray at bedside for which she also declines. She does smile when I mention that I spoke with her daughter on the phone and that she will be back in town from [place] tomorrow.   Spoke with daughter, Mary Arellano, via telephone. Her flight was cancelled out of [place] today and unfortunately will not be back in [place] until tomorrow.   I introduced Palliative Medicine as specialized medical care for people living with serious illness. It focuses on providing relief  from the symptoms and stress of a serious illness. The goal is to improve quality of life for both the patient and the family.  We discussed a brief life review of the patient. Mary Arellano has been living at [facility] memory care ALF for about 9 years. Prior to hospitalization, using a walker for ambulation and requiring some assist with ADL's. Mary Arellano speaks of noticing a "drastic decline" in her health since this past December with poor appetite, progressive weakness, intermittent confusion, and sleeping more frequently.   Discussed course of hospitalization and interventions including that she is NOT a candidate for invasive cardiac procedures. Conservative management for CHF and MV stenosis. Mary Arellano tells me Mary Arellano's cardiologist has been surprised she has survived this long with weak heart (previous EF of 15%) and age.   Advanced directives, concepts specific to code status, artifical feeding and hydration, and rehospitalization were considered and discussed. Mary Arellano confirms her mother's wishes against heroic measures including resuscitation, life support, and feeding tube. She plans to bring living will and HCPOA documentation to hospital tomorrow.  I attempted to elicit values and goals of care important to the patient. Mary Arellano is worried about her mother discharging before she is back in town. She asks about discharge options. I explained that PT worked with her mother and is recommending  SNF for rehab. With previous decline prior to hospitalization, Mary Arellano does not think pushing physical therapy will benefit her mother.  The difference between aggressive medical intervention and comfort care was considered in light of the patient's goals of care. Introduced hospice philosophy and options. Explained that I would discuss with social work to explore if ALF would accept her mother back along with hospice services. [place] agreeable with exploring this option. Would like to further discuss  tomorrow.   Questions and concerns were addressed. PMT contact number given and plan for face-to-face palliative f/u tomorrow, 3/15.    SUMMARY OF RECOMMENDATIONS    Initial palliative discussion with daughter Mary Arellano) via telephone. She will be back in town by tomorrow and is requesting f/u with palliative provider.   Discussed diagnoses, interventions, and that she is not a candidate for invasive cardiac procedures. Daughter has a good understanding of this.  Daughter confirms her mother's wishes against heroic measures at EOL including DNR/DNI and no artificial nutrition.  Daughter is concerned patient will not benefit or improve by pushing aggressive physical therapy at SNF. Introduced outpatient hospice options. Discussed with SW to see if ALF will accept patient back with hospice services.   Continue current interventions. PMT to f/u 3/15 to further discuss GOC with daughter.   Code Status/Advance Care Planning:  DNR  Symptom Management:   Per attending  Palliative Prophylaxis:   Aspiration and Delirium Protocol  Additional Recommendations (Limitations, Scope, Preferences):  DNR/DNI. Continue medical management.  Psycho-social/Spiritual:   Desire for further Chaplaincy support: yes  Additional Recommendations: Caregiving  Support/Resources and Education on Hospice  Prognosis:   Unable to determine  Discharge Planning: To Be Determined      Primary Diagnoses: Present on Admission: **None**   I have reviewed the medical record, interviewed the patient and family, and examined the patient. The following aspects are pertinent.  Past Medical History:  Diagnosis Date  . AF (atrial fibrillation) (HCC)   . CHF NYHA class III (HCC)   . Hyperlipidemia   . Long term (current) use of anticoagulants   . Myxomatous mitral valve    post mitral valve replacement 2006  . Other primary cardiomyopathies    Social History   Socioeconomic History  . Marital status:  Widowed    Spouse name: None  . Number of children: None  . Years of education: None  . Highest education level: None  Social Needs  . Financial resource strain: None  . Food insecurity - worry: None  . Food insecurity - inability: None  . Transportation needs - medical: None  . Transportation needs - non-medical: None  Occupational History  . Occupation: retired    Associate Professor: RETIRED  . Occupation: lives in [employer]  Tobacco Use  . Smoking status: Former Smoker    Last attempt to quit: 06/01/1951    Years since quitting: 66.2  . Smokeless tobacco: Never Used  Substance and Sexual Activity  . Alcohol use: No  . Drug use: No  . Sexual activity: Not Currently  Other Topics Concern  . None  Social History Narrative  . None   History reviewed. No pertinent family history. Scheduled Meds: . Chlorhexidine Gluconate Cloth  6 each Topical Q0600  . digoxin  0.125 mg Oral QODAY  . furosemide  40 mg Oral Daily  . levothyroxine  25 mcg Oral QAC breakfast  . metoprolol tartrate  50 mg Oral BID  . mupirocin ointment  1 application Nasal BID  . nitrofurantoin  50 mg Oral Daily  . polyethylene glycol  17 g Oral Daily  . Warfarin - Pharmacist Dosing Inpatient   Does not apply q1800   Continuous Infusions: PRN Meds:.acetaminophen, ondansetron **OR** ondansetron (ZOFRAN) IV, sodium chloride, zolpidem Medications Prior to Admission:  Prior to Admission medications   Medication Sig Start Date End Date Taking? Authorizing Provider  acetaminophen (TYLENOL) 500 MG tablet Take 1,000 mg by mouth every 6 (six) hours as needed for mild pain or fever.  04/28/11  Yes Stacie Glaze, MD  amoxicillin (AMOXIL) 500 MG tablet Take 500 mg by mouth See admin instructions. Give 4 tabs 1 hour prior to dental work   Yes [provider]  benzonatate (TESSALON) 100 MG capsule Take 1 capsule (100 mg total) by mouth 2 (two) times daily as needed for cough. 04/20/17  Yes Nafziger, [name], NP    dextromethorphan (GNP COUGH DM ER) 30 MG/5ML liquid Take 30 mg by mouth 2 (two) times daily as needed for cough.   Yes [provider]  digoxin (LANOXIN) 0.125 MG tablet Take 0.125 mg by mouth every other day. Hold if pulse is less than 60.   Yes [provider]  ENSURE (ENSURE) Take 237 mLs by mouth daily.   Yes [provider]  furosemide (LASIX) 40 MG tablet Take 40 mg by mouth daily.    Yes [provider]  levothyroxine (SYNTHROID, LEVOTHROID) 25 MCG tablet Take 25 mcg by mouth daily.     Yes [provider]  metoprolol tartrate (LOPRESSOR) 50 MG tablet Take 1 tablet (50 mg total) by mouth daily. 11/12/16  Yes Nafziger, [name], NP  Multiple Vitamins-Minerals (CERTAVITE/ANTIOXIDANTS PO) Take 1 tablet by mouth daily.    Yes [provider]  nitrofurantoin (MACRODANTIN) 50 MG capsule Take 50 mg by mouth daily.   Yes [provider]  sodium chloride (OCEAN) 0.65 % nasal spray Place 1 spray into the nose as needed for congestion (Keeps at the bedside.).    Yes [provider]  warfarin (COUMADIN) 2.5 MG tablet Take 2.5 mg by mouth See admin instructions. Takes 2.5mg  on Sunday, Tuesday, Wednesday, Thursday, and Saturday.   Yes [provider]  warfarin (COUMADIN) 5 MG tablet Take 5 mg by mouth See admin instructions. Takes 5mg  on Monday and Friday.   Yes [provider]   Allergies  Allergen Reactions  . Simvastatin     REACTION: liver function elevation   Review of Systems  Unable to perform ROS: Dementia   Physical Exam  Constitutional: She is cooperative. She appears ill.  HENT:  Head: Normocephalic and atraumatic.  Pulmonary/Chest: No accessory muscle usage. No tachypnea. No respiratory distress.  Neurological: She is alert.  Oriented to person/place  Skin: Skin is warm and dry.  Nursing note and vitals reviewed.  Vital Signs: BP (!) 122/59 (BP Location: Left Arm)   Pulse (!) 54   Temp 98 F  (36.7 C) (Axillary)   Resp 18   Ht 5\' 3"  (1.6 m)   Wt 51.3 kg (113 lb 1.5 oz)   SpO2 94%   BMI 20.03 kg/m  Pain Assessment: No/denies pain   Pain Score: 0-No pain   SpO2: SpO2: 94 % O2 Device:SpO2: 94 % O2 Flow Rate: .O2 Flow Rate (L/min): 2 L/min  IO: Intake/output summary:   Intake/Output Summary (Last 24 hours) at 08/11/2017 1434 Last data filed at 08/10/2017 2200 Gross per 24 hour  Intake 210 ml  Output 450 ml  Net -240 ml  LBM: Last BM Date: (pt unsure) Baseline Weight: Weight: 51.3 kg (113 lb 1.5 oz) Most recent weight: Weight: 51.3 kg (113 lb 1.5 oz)     Palliative Assessment/Data: PPS 40%   Flowsheet Rows     Most Recent Value  Intake Tab  Referral Department  Hospitalist  Unit at Time of Referral  Med/Surg Unit  Palliative Care Primary Diagnosis  Cardiac  Palliative Care Type  New Palliative care  Reason for referral  Clarify Goals of Care  Date first seen by Palliative Care  08/11/17  Clinical Assessment  Palliative Performance Scale Score  40%  Psychosocial & Spiritual Assessment  Palliative Care Outcomes  Patient/Family meeting held?  Yes  Who was at the meeting?  discussed with daughter via telephone  Palliative Care Outcomes  Clarified goals of care, Provided end of life care assistance, Provided psychosocial or spiritual support, ACP counseling assistance, Counseled regarding hospice      Time In/Out: 0900-0930, 1340-1400 Time Total: Greater than 50%  of this time was spent counseling and coordinating care related to the above assessment and plan.  Signed by:  Vennie Homans, FNP-C Palliative Medicine Team  Phone: [phone] Fax: [phone]   Please contact Palliative Medicine Team phone at [phone] for questions and concerns.  For individual provider: See Loretha Stapler

## 2017-08-11 NOTE — Progress Notes (Signed)
ANTICOAGULATION CONSULT NOTE - Follow-Up Consult  Pharmacy Consult for Coumadin Indication: atrial fibrillation  Allergies  Allergen Reactions  . Simvastatin     REACTION: liver function elevation   Patient Measurements: Height: 5\' 3"  (160 cm) Weight: 113 lb 1.5 oz (51.3 kg) IBW/kg (Calculated) : 52.4  Vital Signs: Temp: 98.5 F (36.9 C) (03/14 0459) Temp Source: Oral (03/14 0459) BP: 138/72 (03/14 0459) Pulse Rate: 64 (03/14 0459)  Labs: Recent Labs    08/08/17 1804 08/08/17 2334 08/09/17 0534 08/10/17 0530 08/10/17 2343 08/11/17 0529  HGB  --   --   --  14.8  --   --   HCT  --   --   --  43.9  --   --   PLT  --   --   --  178  --   --   LABPROT  --   --  30.4* 31.4*  --  24.9*  INR  --   --  2.94 3.06  --  2.28  CREATININE  --   --  0.88 0.88 0.98  --   TROPONINI 0.15* 0.20* 0.17*  --   --   --    Estimated Creatinine Clearance: 32.1 mL/min (by C-G formula based on SCr of 0.98 mg/dL).  Medical History: Past Medical History:  Diagnosis Date  . AF (atrial fibrillation) (HCC)   . CHF NYHA class III (HCC)   . Hyperlipidemia   . Long term (current) use of anticoagulants   . Myxomatous mitral valve    post mitral valve replacement 2006  . Other primary cardiomyopathies    Medications:  PTA Warfarin: Coumadin 2.5mg  daily except 5mg  on Monday & Friday  Assessment: 84 yo F admitted with HF exacerbation on chronic Coumadin for Afib.  She is from [facility] per [facility] clinic records she has been therapeutic on warfarin dose for several months.   CBC WNL, no bleeding noted today.  INR therapeutic on admission (2.37)  Today, 08/11/2017  INR now back into therapeutic range, 2.28  Regular diet, eating breakfast  Goal of Therapy:  INR 2-3   Plan:   Warfarin 2.5 mg today at 1200  Daily INR  Monitor for signs and symptoms of bleeding  Otho Bellows PharmD Pager [phone] 08/11/2017, 11:15 AM

## 2017-08-11 NOTE — Progress Notes (Signed)
CSW following to assist with discharge planning. Patient from [facility]. Patient's daughter uncertain about patient's discharge plans due to being out of town and not up to date with patient's medical status and prognosis.   CSW contacted by Palliative NP Marlene Bast and informed that Palliative meeting has been scheduled for 08/12/2017. Palliative NP reported that she feels patient may be appropriate for Hospice care.  [facility] staff scheduled to come assess patient to see if patient is able to return to their facility.   CSW will continue to follow and assist with discharge planning.   Barrier: Palliative team working to communicate with daughter as patient not oriented to make health care decisions about prognosis and level of care needs post discharge.   Plan: Follow up with [facility] regarding patient's ability to return. Follow up with patient's daughter regarding patient's discharge plan.   Celso Sickle, [place] Clinical Social Worker [facility] Cell#: [phone]

## 2017-08-11 NOTE — Progress Notes (Signed)
Cardiac Monitoring orders have expired. PCP was notified

## 2017-08-12 LAB — PROTIME-INR
INR: 1.74
Prothrombin Time: 20.2 seconds — ABNORMAL HIGH (ref 11.4–15.2)

## 2017-08-12 MED ORDER — WARFARIN SODIUM 2.5 MG PO TABS: 2.5000 mg | ORAL_TABLET | ORAL | Status: DC

## 2017-08-12 MED ORDER — WARFARIN SODIUM 5 MG PO TABS
5.0000 mg | ORAL_TABLET | ORAL | Status: DC
  Administered 2017-08-12: 5 mg via ORAL
  Filled 2017-08-12: qty 1

## 2017-08-12 NOTE — Progress Notes (Signed)
[facility] PROGRESS NOTE    Progress Note  Mary Arellano  QMV:[id number] DOB: 09-24-1926 DOA: 08/08/2017 PCP: [name], NP     Brief Narrative:   Mary Arellano is an 84 y.o. female past medical history of systolic heart failure with an EF of 15% in 2008, chronic atrial fibrillation on Coumadin, advanced dementia comes into the hospital complaining of shortness of breath.  Assessment/Plan:   Acute systolic CHF (congestive heart failure) (HCC) She has been diuresis. She is on Lasix, and metoprolol.  Which she will continue as an outpatient. Patient is awaiting placement ready to be discharged. No Changes made to her medication and her status orcondition has not changed  Elevated cardiac biomarkers: In the setting of acute decompensated systolic heart failure likely demand ischemia.  Hypokalemia Replete.  Dementia without behavioral disturbance Without behavioral disturbances, after called daughter no successful.   DVT prophylaxis: lovenox Family Communication:none Disposition Plan/Barrier to D/C: home in today Code Status:     Code Status Orders  (From admission, onward)        Start     Ordered   08/08/17 1748  Do not attempt resuscitation (DNR)  Continuous    Question Answer Comment  In the event of cardiac or respiratory ARREST Do not call a "code blue"   In the event of cardiac or respiratory ARREST Do not perform Intubation, CPR, defibrillation or ACLS   In the event of cardiac or respiratory ARREST Use medication by any route, position, wound care, and other measures to relive pain and suffering. May use oxygen, suction and manual treatment of airway obstruction as needed for comfort.      08/08/17 1747    Code Status History    Date Active Date Inactive Code Status Order ID Comments User Context   This patient has a current code status but no historical code status.    Advance Directive Documentation     Most Recent Value  Type  of Advance Directive  Living will  Pre-existing out of facility DNR order (yellow form or pink MOST form)  No data  "MOST" Form in Place?  No data        IV Access:    Peripheral IV   Procedures and diagnostic studies:   No results found.   Medical Consultants:    None.  Anti-Infectives:   None  Subjective:    Mary Arellano patient is more talkative today, but still significantly not aware of her state or condition.  Objective:    Vitals:   08/11/17 1350 08/11/17 2100 08/12/17 0409 08/12/17 0900  BP: (!) 122/59 (!) 118/56 (!) 152/79 120/83  Pulse: (!) 54 73 (!) 57 79  Resp: 18 18 18 16   Temp: 98 F (36.7 C) 99.3 F (37.4 C) 98.2 F (36.8 C)   TempSrc: Axillary Oral Oral   SpO2: 94% 92% 91% 96%  Weight:      Height:        Intake/Output Summary (Last 24 hours) at 08/12/2017 1106 Last data filed at 08/12/2017 1001 Gross per 24 hour  Intake 600 ml  Output 600 ml  Net 0 ml   Filed Weights   08/08/17 2002  Weight: 51.3 kg (113 lb 1.5 oz)    Exam: General exam: In no acute distress, cachectic appearing Respiratory system: Good air movement with crackles on the right lower lobe. Cardiovascular system: S1 & S2 heard, RRR Gastrointestinal system: Abdomen is nondistended, soft and nontender.  Extremities: No lower  extremity edema. Skin: No rashes, lesions or ulcers Psychiatry: Able to follow commands poor insight of medical condition.   Data Reviewed:    Labs: Basic Metabolic Panel: Recent Labs  Lab 08/08/17 0929 08/09/17 0534 08/10/17 0530 08/10/17 2343  NA 141 137 140 142  K 3.5 3.2* 3.7 3.7  CL 99* 96* 99* 97*  CO2 31 29 29  32  GLUCOSE 118* 126* 113* 164*  BUN 17 17 25* 29*  CREATININE 0.96 0.88 0.88 0.98  CALCIUM 9.4 9.0 9.4 9.6  MG  --   --  1.8  --    GFR Estimated Creatinine Clearance: 32.1 mL/min (by C-G formula based on SCr of 0.98 mg/dL). Liver Function Tests: Recent Labs  Lab 08/08/17 0929 08/10/17 0530  AST 36  39  ALT 31 27  ALKPHOS 65 66  BILITOT 1.7* 1.8*  PROT 6.8 6.5  ALBUMIN 3.5 3.1*   No results for input(s): LIPASE, AMYLASE in the last 168 hours. No results for input(s): AMMONIA in the last 168 hours. Coagulation profile Recent Labs  Lab 08/08/17 1648 08/09/17 0534 08/10/17 0530 08/11/17 0529 08/12/17 0500  INR 2.37 2.94 3.06 2.28 1.74    CBC: Recent Labs  Lab 08/08/17 0903 08/10/17 0530  WBC 9.2 8.5  NEUTROABS 7.5 5.7  HGB 15.2* 14.8  HCT 45.1 43.9  MCV 91.7 92.6  PLT 157 178   Cardiac Enzymes: Recent Labs  Lab 08/08/17 0929 08/08/17 1804 08/08/17 2334 08/09/17 0534  CKTOTAL 54  --   --   --   TROPONINI  --  0.15* 0.20* 0.17*   BNP (last 3 results) No results for input(s): PROBNP in the last 8760 hours. CBG: Recent Labs  Lab 08/08/17 0851  GLUCAP 113*   D-Dimer: No results for input(s): DDIMER in the last 72 hours. Hgb A1c: No results for input(s): HGBA1C in the last 72 hours. Lipid Profile: No results for input(s): CHOL, HDL, LDLCALC, TRIG, CHOLHDL, LDLDIRECT in the last 72 hours. Thyroid function studies: No results for input(s): TSH, T4TOTAL, T3FREE, THYROIDAB in the last 72 hours.  Invalid input(s): FREET3 Anemia work up: No results for input(s): VITAMINB12, FOLATE, FERRITIN, TIBC, IRON, RETICCTPCT in the last 72 hours. Sepsis Labs: Recent Labs  Lab 08/08/17 0903 08/10/17 0530  WBC 9.2 8.5   Microbiology Recent Results (from the past 240 hour(s))  MRSA PCR Screening     Status: Abnormal   Collection Time: 08/08/17  8:42 PM  Result Value Ref Range Status   MRSA by PCR POSITIVE (A) NEGATIVE Final    Comment:        The GeneXpert MRSA Assay (FDA approved for NASAL specimens only), is one component of a comprehensive MRSA colonization surveillance program. It is not intended to diagnose MRSA infection nor to guide or monitor treatment for MRSA infections. RESULT CALLED TO, READ BACK BY AND VERIFIED WITH: M.STEFFENS RN 08/08/2017  2257 JR Performed at [facility], [address] [place], [place] [address]      Medications:   . Chlorhexidine Gluconate Cloth  6 each Topical Q0600  . digoxin  0.125 mg Oral QODAY  . furosemide  40 mg Oral Daily  . levothyroxine  25 mcg Oral QAC breakfast  . metoprolol tartrate  50 mg Oral BID  . mupirocin ointment  1 application Nasal BID  . nitrofurantoin  50 mg Oral Daily  . polyethylene glycol  17 g Oral Daily  . warfarin  5 mg Oral Once per day on Mon  Fri   And  . [START ON 08/13/2017] warfarin  2.5 mg Oral Once per day on Sun Tue Wed Thu Sat  . Warfarin - Pharmacist Dosing Inpatient   Does not apply q1800   Continuous Infusions:    LOS: 4 days   Marinda Elk  [facility] Pager [phone]  *Please refer to [email or web address], password [email or web address] to get updated schedule on who will round on this patient, as hospitalists switch teams weekly. If 7PM-7AM, please contact night-coverage at [email or web address], password [email or web address] for any overnight needs.  08/12/2017, 11:06 AM

## 2017-08-12 NOTE — Progress Notes (Signed)
ANTICOAGULATION CONSULT NOTE - Follow-Up Consult  Pharmacy Consult for Coumadin Indication: atrial fibrillation  Allergies  Allergen Reactions  . Simvastatin     REACTION: liver function elevation   Patient Measurements: Height: 5\' 3"  (160 cm) Weight: 113 lb 1.5 oz (51.3 kg) IBW/kg (Calculated) : 52.4  Vital Signs: Temp: 98.2 F (36.8 C) (03/15 0409) Temp Source: Oral (03/15 0409) BP: 152/79 (03/15 0409) Pulse Rate: 57 (03/15 0409)  Labs: Recent Labs    08/10/17 0530 08/10/17 2343 08/11/17 0529 08/12/17 0500  HGB 14.8  --   --   --   HCT 43.9  --   --   --   PLT 178  --   --   --   LABPROT 31.4*  --  24.9* 20.2*  INR 3.06  --  2.28 1.74  CREATININE 0.88 0.98  --   --    Estimated Creatinine Clearance: 32.1 mL/min (by C-G formula based on SCr of 0.98 mg/dL).  Medical History: Past Medical History:  Diagnosis Date  . AF (atrial fibrillation) (HCC)   . CHF NYHA class III (HCC)   . Hyperlipidemia   . Long term (current) use of anticoagulants   . Myxomatous mitral valve    post mitral valve replacement 2006  . Other primary cardiomyopathies    Medications:  PTA Warfarin: Coumadin 2.5mg  daily except 5mg  on Monday & Friday  Assessment: 84 yo F admitted with HF exacerbation on chronic Coumadin for Afib.  She is from [facility]- per [facility] clinic records she has been therapeutic on warfarin dose for several months.   CBC WNL, no bleeding noted today.  INR therapeutic on admission (2.37)  Today, 08/12/2017  INR below goal at 1.74  No bleeding reported  Goal of Therapy:  INR 2-3   Plan:   Resume home dose 2.5 qd x 5 mg M/F  Herby Abraham, Pharm.D. [phone] 08/12/2017 7:27 AM

## 2017-08-12 NOTE — Progress Notes (Addendum)
LCSW following for facility placement.  Patient is from [facility].  Patient will return to ALF. LCSW spoke with Patient's daughter, Eileen Stanford who is agreeable to return with return to facility with palliative.   LCSW confirmed return with facility.   LCSW faxed dc docs to facility.   LCSW notified family of transfer.   Patient will transfer by car. LCSW called son in law, he cannot come until after 5 when he is of work.   No packet from CSW. Please send hard scripts with patient if any.   RN report number: [phone]  Coralyn Helling, Norberta Keens CSW [phone]

## 2017-08-12 NOTE — Progress Notes (Signed)
RN notified by physical therapy that patient was found on floor. RN went to room with therapist and charge RN. All four bed rails were up, bedside table next to bed, patient lying on her right hip on the other side of bedside table. It would appear that bed alarm was not set. No complaints of pain, no change in orientation, vitals stable, MD made aware, no new orders. Daughter, Eileen Stanford, called and voicemail left at 10:15am.

## 2017-08-12 NOTE — NC FL2 (Signed)
[place] MEDICAID FL2 LEVEL OF CARE SCREENING TOOL     IDENTIFICATION  Patient Name: Mary Arellano Birthdate: 1927/04/22 Sex: female Admission Date (Current Location): 08/08/2017  [facility] and [place] Number:  Producer, television/film/video and Address:  [facility],  [address] [place], [place] [address]      Provider Number: [id number]  Attending Physician Name and Address:  Marinda Elk, MD  Relative Name and Phone Number:       Current Level of Care: Hospital Recommended Level of Care: Assisted Living Facility Prior Approval Number:    Date Approved/Denied:   PASRR Number:    Discharge Plan: Other (Comment)(Assisted Living)    Current Diagnoses: Patient Active Problem List   Diagnosis Date Noted  . Fall   . Mitral valve stenosis   . Palliative care by specialist   . Hypokalemia 08/10/2017  . Dementia without behavioral disturbance 08/10/2017  . Acute systolic CHF (congestive heart failure) (HCC) 08/08/2017  . Long term (current) use of anticoagulants 03/07/2017  . Goals of care, counseling/discussion 11/22/2013  . Left hip pain 05/04/2011  . TRANSAMINASES, SERUM, ELEVATED 02/24/2010  . PAROXYSMAL VENTRICULAR TACHYCARDIA 11/28/2008  . ICD-[employer] 11/28/2008  . HYPOTHYROIDISM, SECONDARY 11/27/2008  . UTI'S, RECURRENT 10/11/2008  . SYNCOPE 04/17/2008  . MITRAL STENOSIS/ INSUFFICIENCY, NON-RHEUMATIC 04/16/2008  . Acute on chronic systolic congestive heart failure (HCC) 04/16/2008  . HYPERLIPIDEMIA, TYPE II 02/27/2008  . SYMPTOM, MEMORY LOSS 01/02/2007  . ADVEF, DRUG/MEDICINAL/BIOLOGICAL SUBST NOS 01/02/2007  . CARDIOMYOPATHY 12/30/2006  . Atrial fibrillation, chronic (HCC) 12/21/2006    Orientation RESPIRATION BLADDER Height & Weight     Self    External catheter Weight: 113 lb 1.5 oz (51.3 kg) Height:  5\' 3"  (160 cm)  BEHAVIORAL SYMPTOMS/MOOD NEUROLOGICAL BOWEL NUTRITION STATUS      Continent Diet(See dc summary)  AMBULATORY  STATUS COMMUNICATION OF NEEDS Skin   Extensive Assist Verbally(Minimally, follows commands) Normal                       Personal Care Assistance Level of Assistance  Bathing, Feeding, Dressing Bathing Assistance: Limited assistance Feeding assistance: Independent Dressing Assistance: Limited assistance     Functional Limitations Info  Sight, Hearing, Speech Sight Info: Adequate Hearing Info: Adequate Speech Info: Adequate    SPECIAL CARE FACTORS FREQUENCY                       Contractures Contractures Info: Not present    Additional Factors Info  Code Status, Allergies Code Status Info: DNR Allergies Info: Simvastatin           Current Medications (08/12/2017):  This is the current hospital active medication list Current Facility-Administered Medications  Medication Dose Route Frequency Provider Last Rate Last Dose  . acetaminophen (TYLENOL) tablet 1,000 mg  1,000 mg Oral Q6H PRN Carron Curie, MD      . Chlorhexidine Gluconate Cloth 2 % PADS 6 each  6 each Topical Q0600 Carron Curie, MD   6 each at 08/12/17 0414  . digoxin (LANOXIN) tablet 0.125 mg  0.125 mg Oral Ileene Hutchinson, MD   0.125 mg at 08/12/17 1103  . furosemide (LASIX) tablet 40 mg  40 mg Oral Daily Abelino Derrick, PA-C   40 mg at 08/12/17 1104  . levothyroxine (SYNTHROID, LEVOTHROID) tablet 25 mcg  25 mcg Oral QAC breakfast Carron Curie, MD   25 mcg at 08/12/17 1103  . metoprolol tartrate (LOPRESSOR)  tablet 50 mg  50 mg Oral BID Carron Curie, MD   50 mg at 08/12/17 1103  . mupirocin ointment (BACTROBAN) 2 % 1 application  1 application Nasal BID Carron Curie, MD   1 application at 08/12/17 1103  . nitrofurantoin (MACRODANTIN) capsule 50 mg  50 mg Oral Daily Carron Curie, MD   50 mg at 08/12/17 1103  . ondansetron (ZOFRAN) tablet 4 mg  4 mg Oral Q6H PRN Carron Curie, MD       Or  . ondansetron (ZOFRAN) injection 4 mg  4 mg Intravenous Q6H PRN Carron Curie, MD      . polyethylene glycol (MIRALAX /  GLYCOLAX) packet 17 g  17 g Oral Daily Marinda Elk, MD   17 g at 08/12/17 1103  . sodium chloride (OCEAN) 0.65 % nasal spray 1 spray  1 spray Nasal PRN Carron Curie, MD   1 spray at 08/12/17 0418  . warfarin (COUMADIN) tablet 5 mg  5 mg Oral Once per day on Mon Fri Herby Abraham, [facility]       And  . Melene Muller ON 08/13/2017] warfarin (COUMADIN) tablet 2.5 mg  2.5 mg Oral Once per day on Sun Tue Wed Thu Sat Herby Abraham, [facility]      . Warfarin - Pharmacist Dosing Inpatient   Does not apply q1800 Phylliss Blakes, RPH      . zolpidem (AMBIEN) tablet 5 mg  5 mg Oral QHS PRN Carron Curie, MD         Discharge Medications: Medication List             STOP taking these medications            amoxicillin 500 MG tablet Commonly known as:  AMOXIL    nitrofurantoin 50 MG capsule Commonly known as:  MACRODANTIN                       TAKE these medications            acetaminophen 500 MG tablet Commonly known as:  TYLENOL Take 1,000 mg by mouth every 6 (six) hours as needed for mild pain or fever.    benzonatate 100 MG capsule Commonly known as:  TESSALON Take 1 capsule (100 mg total) by mouth 2 (two) times daily as needed for cough.    CERTAVITE/ANTIOXIDANTS PO Take 1 tablet by mouth daily.    digoxin 0.125 MG tablet Commonly known as:  LANOXIN Take 0.125 mg by mouth every other day. Hold if pulse is less than 60.    ENSURE Take 237 mLs by mouth daily.    furosemide 40 MG tablet Commonly known as:  LASIX Take 40 mg by mouth daily.    GNP COUGH DM ER 30 MG/5ML liquid Generic drug:  dextromethorphan Take 30 mg by mouth 2 (two) times daily as needed for cough.    levothyroxine 25 MCG tablet Commonly known as:  SYNTHROID, LEVOTHROID Take 25 mcg by mouth daily.    metoprolol tartrate 50 MG tablet Commonly known as:  LOPRESSOR Take 1 tablet (50 mg total) by mouth daily.    sodium chloride 0.65 % nasal spray Commonly known as:  OCEAN Place 1 spray into  the nose as needed for congestion (Keeps at the bedside.).    warfarin 2.5 MG tablet Commonly known as:  COUMADIN Take as directed. If you are unsure how to take this medication, talk to your nurse or doctor. Original instructions:  Take 2.5 mg by mouth See admin instructions. Takes 2.5mg  on Sunday, Tuesday, Wednesday, Thursday, and Saturday.    warfarin 5 MG tablet Commonly known as:  COUMADIN Take as directed. If you are unsure how to take this medication, talk to your nurse or doctor. Original instructions:  Take 5 mg by mouth See admin instructions. Takes 5mg  on Monday and Friday.     Relevant Imaging Results:  Relevant Lab Results:   Additional Information ssn: [id number] ALF, with palliative to follow.   Coralyn Helling, LCSW

## 2017-08-12 NOTE — Progress Notes (Signed)
Physical Therapy Treatment Patient Details Name: Mary Arellano MRN: [id number] DOB: [birth year] Today's Date: 08/12/2017    History of Present Illness 84 yo female admitted with CHF, elevated troponin. Hx of CHF, A fib. Pt is from an ALF    PT Comments    Upon entering room (door was closed) found pt on floor next to bed laying on her R hip AxO . Appears pt got out of the foot of the bed as the rail was up and her breakfast tray was also on that side.  Notified RN.  RN performed assessment and vitals.  No obvious injuries, no c/o pain and able to perform active B LE mvts.  Pt unaware how she fell and unaware why she got OOB.  With RN assisted to recliner.  Assisted with amb pt to bathroom.  Pt fully WBing B LE with no signs of pain, no facial expressions and equal steps.  Assisted in bathroom with peri care and amb back to recliner.  Very unsteady gait with poor posture. Limited activity tolerance as she appeared fatigued with each activity.  Also present with impaired safety cognition.  Pt unaware she fell.  Pt is from an ALF but will need ST Rehab at SNF prior to returning.  Follow Up Recommendations  SNF     Equipment Recommendations  None recommended by PT    Recommendations for Other Services       Precautions / Restrictions Precautions Precautions: Fall Restrictions Weight Bearing Restrictions: No    Mobility  Bed Mobility               General bed mobility comments: OOB  Transfers   Equipment used: Rolling walker (2 wheeled);None Transfers: Sit to/from [employer] Sit to Stand: Mod assist Stand pivot transfers: Mod assist       General transfer comment: 75% VC's for direction and safety esp with turn completion prior to sitting.  Assisted on/off recliner and on/off toilet.  Poor forward flex posture and impaired balance correction reaction.    Ambulation/Gait Ambulation/Gait assistance: Min assist;Mod assist Ambulation Distance (Feet):  20 Feet(10 feet x 2 to and from bathroom only) Assistive device: Rolling walker (2 wheeled) Gait Pattern/deviations: Step-through pattern;Decreased stride length;Shuffle;Trunk flexed;Narrow base of support Gait velocity: decreased   General Gait Details: very unsteady gait with B hips and knees flex as well as forward flex trunk posture.  Required increased assist with increased fatigue.  Poor balance and poor self correction.  HIGH FALL RISK.   Stairs            Wheelchair Mobility    Modified Rankin (Stroke Patients Only)       Balance                                            Cognition Arousal/Alertness: Awake/alert   Overall Cognitive Status: No family/caregiver present to determine baseline cognitive functioning                                 General Comments: AxOx1  follows simple commands  pleasantly confused to current situation        Exercises      General Comments        Pertinent Vitals/Pain Pain Assessment: No/denies pain    Home Living  Prior Function            PT Goals (current goals can now be found in the care plan section) Progress towards PT goals: Progressing toward goals    Frequency    Min 3X/week      PT Plan Current plan remains appropriate    Co-evaluation              AM-PAC PT "6 Clicks" Daily Activity  Outcome Measure  Difficulty turning over in bed (including adjusting bedclothes, sheets and blankets)?: A Lot Difficulty moving from lying on back to sitting on the side of the bed? : A Lot Difficulty sitting down on and standing up from a chair with arms (e.g., wheelchair, bedside commode, etc,.)?: A Lot Help needed moving to and from a bed to chair (including a wheelchair)?: A Lot Help needed walking in hospital room?: A Lot Help needed climbing 3-5 steps with a railing? : Total 6 Click Score: 11    End of Session Equipment Utilized During  Treatment: Gait belt Activity Tolerance: Patient limited by fatigue Patient left: in chair;with chair alarm set Nurse Communication: Mobility status PT Visit Diagnosis: Muscle weakness (generalized) (M62.81);Difficulty in walking, not elsewhere classified (R26.2)     Time: 4259-5638 PT Time Calculation (min) (ACUTE ONLY): 27 min  Charges:  $Gait Training: 8-22 mins $Therapeutic Activity: 8-22 mins                    G Codes:       Felecia Shelling  PTA [facility]  Acute  Rehab Pager      [phone]

## 2017-08-12 NOTE — Progress Notes (Signed)
Palliative Medicine RN Note: Rec'd call from daughter about d/c plans. She would like to ensure hospice is set up at d/c.  Hospice order will have to be placed on d/c summary and d/c orders.   I have spoken with Sandria Senter at [facility] [phone]). Order for hospice placed; will fax to her at [phone].  Margret Chance Theia Dezeeuw, RN, BSN, [facility] Palliative Medicine Team 08/12/2017 11:51 AM Office [phone]

## 2017-08-16 ENCOUNTER — Telehealth: Payer: Self-pay | Admitting: Family Medicine

## 2017-08-16 ENCOUNTER — Telehealth: Payer: Self-pay

## 2017-08-16 NOTE — Telephone Encounter (Signed)
Copied from CRM [id number]. Topic: Quick Communication - See Telephone Encounter >> Aug 16, 2017  9:21 AM Landry Mellow wrote: CRM for notification. See Telephone encounter for:   08/16/17. April from [facility] called - she needs order for palliative care from pcp.  They were given one from the hospital dt, but need it from the pcp.   Cb# [phone]

## 2017-08-16 NOTE — Telephone Encounter (Signed)
Ok to order 

## 2017-08-16 NOTE — Telephone Encounter (Addendum)
Rec'd call from [place], patient's daughter. [facility] reports they have not gotten OK from PCP for palliative care. I also spoke with Byrd Hesselbach with [facility] palliative who said the same thing.   I called pt's PCP office (Dr Evelene Croon). They have gotten a call from April at [facility] requesting order for palliative care and will add my request to that one. I asked if the office needs hospital records to proceed, but they are on Epic and have access.    1639 Spoke w Byrd Hesselbach @ [facility]. They have not gotten referral. Faxed copy of telephone note with OK from [facility], as she said that should work to start services.  Margret Chance Augustine Brannick, RN, BSN, [facility] Palliative Medicine Team 08/16/2017 4:40 PM Office [phone]

## 2017-08-16 NOTE — Telephone Encounter (Signed)
Copied from CRM [id number]. Topic: Quick Communication - See Telephone Encounter >> Aug 16, 2017  9:21 AM Landry Mellow wrote: CRM for notification. See Telephone encounter for:   08/16/17. April from [facility] called - she needs order for palliative care from pcp.  They were given one from the hospital dt, but need it from the pcp.   Cb# [phone] >> Aug 16, 2017  9:54 AM Cipriano Bunker wrote: [facility] - palliative care at [facility] - [phone]   Pt. Discharged 3/14 and notes not sent to [facility]..  In Epic-    Hospice is needing Palliative care and will need order.    Hilda Lias - Palliative side [phone].    Please call Hilda Lias regarding this order.

## 2017-08-17 NOTE — Telephone Encounter (Signed)
Order for palliative care sent to hospice with message to contact April.  Tried to reach April to inform her.  Received a voicemail that stated I had reached April Cochram but did not say company name or if a Dance movement psychotherapist.  No information left.

## 2017-08-17 NOTE — Telephone Encounter (Signed)
Form filled out.  Waiting on signature  

## 2017-08-19 ENCOUNTER — Telehealth: Payer: Self-pay

## 2017-08-19 DIAGNOSIS — R627 Adult failure to thrive: Secondary | ICD-10-CM

## 2017-08-19 NOTE — Telephone Encounter (Signed)
Mary Arellano @ Palliative Care is calling to ask for referral to Hospice. Pt has become increasingly care dependant. She requires assistance with all ADL's, requires 2 people for transfer, is unable to feed or groom herself and is continuing to lose weight. She is still residing at [facility] but needs to transfer to Hospice care.   [name] - Please advise if ok for referral. Thanks!

## 2017-08-19 NOTE — Telephone Encounter (Signed)
Ok for referral to Hospice.

## 2017-08-19 NOTE — Telephone Encounter (Signed)
Referral to Hospice placed.

## 2017-08-24 ENCOUNTER — Telehealth: Payer: Self-pay | Admitting: Adult Health

## 2017-08-24 NOTE — Telephone Encounter (Signed)
Copied from CRM [id number]. Topic: Quick Communication - See Telephone Encounter >> Aug 24, 2017  3:42 PM Trula Slade wrote: CRM for notification. See Telephone encounter for: 08/24/17. Mary Arellano, the patient's daughter [phone] wanted the provider to know that her mother was placed in Hospice Care at the end of last week, and they said her health is going down. Hospice has decided to stop some of her medication.

## 2017-08-25 NOTE — Telephone Encounter (Signed)
Noted  

## 2017-08-26 ENCOUNTER — Ambulatory Visit: Payer: Medicare PPO | Admitting: Cardiovascular Disease

## 2017-08-29 ENCOUNTER — Ambulatory Visit: Payer: Medicare PPO | Admitting: Cardiovascular Disease

## 2017-08-29 ENCOUNTER — Telehealth: Payer: Self-pay | Admitting: Family Medicine

## 2017-08-29 ENCOUNTER — Ambulatory Visit: Payer: Medicare PPO

## 2017-08-29 NOTE — Telephone Encounter (Signed)
Copied from CRM [id number]. Topic: General - Other >> Aug 29, 2017  3:48 PM Percival Spanish wrote:  Lucile Crater with Hospice call to say pt pass away on 09-14-17 at 9:45 am

## 2017-08-29 DEATH — deceased
